# Patient Record
Sex: Female | Born: 1935
Health system: Southern US, Community
[De-identification: ages and names within clinical notes are randomized; demographics above are authoritative.]

## PROBLEM LIST (undated history)

## (undated) DIAGNOSIS — C4492 Squamous cell carcinoma of skin, unspecified: Secondary | ICD-10-CM

## (undated) DIAGNOSIS — T4145XA Adverse effect of unspecified anesthetic, initial encounter: Secondary | ICD-10-CM

## (undated) DIAGNOSIS — E079 Disorder of thyroid, unspecified: Secondary | ICD-10-CM

## (undated) DIAGNOSIS — M353 Polymyalgia rheumatica: Secondary | ICD-10-CM

## (undated) DIAGNOSIS — E785 Hyperlipidemia, unspecified: Secondary | ICD-10-CM

## (undated) DIAGNOSIS — T783XXA Angioneurotic edema, initial encounter: Secondary | ICD-10-CM

## (undated) DIAGNOSIS — N6019 Diffuse cystic mastopathy of unspecified breast: Secondary | ICD-10-CM

## (undated) DIAGNOSIS — K589 Irritable bowel syndrome without diarrhea: Secondary | ICD-10-CM

## (undated) DIAGNOSIS — R112 Nausea with vomiting, unspecified: Secondary | ICD-10-CM

## (undated) DIAGNOSIS — I10 Essential (primary) hypertension: Secondary | ICD-10-CM

## (undated) DIAGNOSIS — K6289 Other specified diseases of anus and rectum: Secondary | ICD-10-CM

## (undated) DIAGNOSIS — T8859XA Other complications of anesthesia, initial encounter: Secondary | ICD-10-CM

## (undated) DIAGNOSIS — F419 Anxiety disorder, unspecified: Secondary | ICD-10-CM

## (undated) DIAGNOSIS — Z9889 Other specified postprocedural states: Secondary | ICD-10-CM

## (undated) DIAGNOSIS — K5790 Diverticulosis of intestine, part unspecified, without perforation or abscess without bleeding: Secondary | ICD-10-CM

## (undated) DIAGNOSIS — E039 Hypothyroidism, unspecified: Secondary | ICD-10-CM

## (undated) HISTORY — DX: Hypothyroidism, unspecified: E03.9

## (undated) HISTORY — DX: Angioneurotic edema, initial encounter: T78.3XXA

## (undated) HISTORY — DX: Polymyalgia rheumatica: M35.3

## (undated) HISTORY — DX: Essential (primary) hypertension: I10

## (undated) HISTORY — PX: DILATION AND CURETTAGE OF UTERUS: SHX78

## (undated) HISTORY — DX: Diffuse cystic mastopathy of unspecified breast: N60.19

## (undated) HISTORY — DX: Hyperlipidemia, unspecified: E78.5

## (undated) HISTORY — DX: Disorder of thyroid, unspecified: E07.9

## (undated) HISTORY — DX: Irritable bowel syndrome, unspecified: K58.9

## (undated) HISTORY — DX: Diverticulosis of intestine, part unspecified, without perforation or abscess without bleeding: K57.90

## (undated) HISTORY — DX: Squamous cell carcinoma of skin, unspecified: C44.92

## (undated) HISTORY — DX: Other specified diseases of anus and rectum: K62.89

---

## 1970-09-05 HISTORY — PX: TONSILLECTOMY: SUR1361

## 1984-09-05 HISTORY — PX: OTHER SURGICAL HISTORY: SHX169

## 1986-09-05 HISTORY — PX: THYROIDECTOMY: SHX17

## 1999-11-08 ENCOUNTER — Encounter: Payer: Self-pay | Admitting: Internal Medicine

## 1999-11-08 ENCOUNTER — Ambulatory Visit (HOSPITAL_COMMUNITY): Admission: RE | Admit: 1999-11-08 | Discharge: 1999-11-08 | Payer: Self-pay | Admitting: Internal Medicine

## 1999-11-17 ENCOUNTER — Observation Stay (HOSPITAL_COMMUNITY): Admission: EM | Admit: 1999-11-17 | Discharge: 1999-11-18 | Payer: Self-pay | Admitting: Internal Medicine

## 2002-09-05 HISTORY — PX: COLONOSCOPY: SHX174

## 2003-02-06 ENCOUNTER — Encounter: Payer: Self-pay | Admitting: Internal Medicine

## 2003-02-06 ENCOUNTER — Ambulatory Visit (HOSPITAL_COMMUNITY): Admission: RE | Admit: 2003-02-06 | Discharge: 2003-02-06 | Payer: Self-pay | Admitting: Internal Medicine

## 2003-09-29 ENCOUNTER — Encounter: Payer: Self-pay | Admitting: Internal Medicine

## 2005-04-20 ENCOUNTER — Ambulatory Visit: Payer: Self-pay | Admitting: Internal Medicine

## 2006-02-01 ENCOUNTER — Ambulatory Visit: Payer: Self-pay | Admitting: Internal Medicine

## 2006-10-02 ENCOUNTER — Ambulatory Visit: Payer: Self-pay | Admitting: Internal Medicine

## 2006-10-02 LAB — CONVERTED CEMR LAB
ALT: 12 units/L (ref 0–40)
AST: 25 units/L (ref 0–37)
BUN: 11 mg/dL (ref 6–23)
Cholesterol: 146 mg/dL (ref 0–200)
Creatinine, Ser: 0.6 mg/dL (ref 0.4–1.2)
HDL: 50.4 mg/dL (ref >39.0)
Hgb A1c MFr Bld: 5.7 % (ref 4.6–6.0)
LDL Cholesterol: 78 mg/dL (ref 0–99)
Potassium: 3.4 meq/L — ABNORMAL LOW (ref 3.5–5.1)
TSH: 0.78 microintl units/mL (ref 0.35–5.50)
Total CHOL/HDL Ratio: 2.9
Triglycerides: 88 mg/dL (ref 0–149)
VLDL: 18 mg/dL (ref 0–40)

## 2006-10-26 ENCOUNTER — Ambulatory Visit: Payer: Self-pay | Admitting: Internal Medicine

## 2006-10-26 LAB — CONVERTED CEMR LAB
Basophils Absolute: 0.1 10*3/uL (ref 0.0–0.1)
Basophils Relative: 0.9 % (ref 0.0–1.0)
Eosinophils Absolute: 0.2 10*3/uL (ref 0.0–0.6)
Eosinophils Relative: 2.4 % (ref 0.0–5.0)
HCT: 39.9 % (ref 36.0–46.0)
Hemoglobin: 13.7 g/dL (ref 12.0–15.0)
Lymphocytes Relative: 36.5 % (ref 12.0–46.0)
MCHC: 34.3 g/dL (ref 30.0–36.0)
MCV: 92 fL (ref 78.0–100.0)
Monocytes Absolute: 0.6 10*3/uL (ref 0.2–0.7)
Monocytes Relative: 8.1 % (ref 3.0–11.0)
Neutro Abs: 3.7 10*3/uL (ref 1.4–7.7)
Neutrophils Relative %: 52.1 % (ref 43.0–77.0)
Platelets: 233 10*3/uL (ref 150–400)
RBC: 4.34 M/uL (ref 3.87–5.11)
RDW: 11.4 % — ABNORMAL LOW (ref 11.5–14.6)
WBC: 7.2 10*3/uL (ref 4.5–10.5)

## 2007-01-05 DIAGNOSIS — K589 Irritable bowel syndrome without diarrhea: Secondary | ICD-10-CM

## 2007-01-05 DIAGNOSIS — I10 Essential (primary) hypertension: Secondary | ICD-10-CM | POA: Insufficient documentation

## 2007-01-05 DIAGNOSIS — Z9889 Other specified postprocedural states: Secondary | ICD-10-CM

## 2007-01-05 DIAGNOSIS — N6019 Diffuse cystic mastopathy of unspecified breast: Secondary | ICD-10-CM

## 2007-01-08 ENCOUNTER — Ambulatory Visit: Payer: Self-pay | Admitting: Internal Medicine

## 2007-01-08 LAB — CONVERTED CEMR LAB
BUN: 9 mg/dL (ref 6–23)
Creatinine, Ser: 0.7 mg/dL (ref 0.4–1.2)
Potassium: 3.8 meq/L (ref 3.5–5.1)
TSH: 0.11 microintl units/mL — ABNORMAL LOW (ref 0.35–5.50)

## 2007-01-22 ENCOUNTER — Ambulatory Visit: Payer: Self-pay | Admitting: Cardiovascular Disease

## 2007-01-22 ENCOUNTER — Ambulatory Visit: Payer: Self-pay | Admitting: Internal Medicine

## 2007-01-22 LAB — CONVERTED CEMR LAB: Tissue Transglutaminase Ab, IgA: <3 units (ref <5)

## 2007-02-15 ENCOUNTER — Ambulatory Visit: Payer: Self-pay | Admitting: Internal Medicine

## 2007-02-15 DIAGNOSIS — D179 Benign lipomatous neoplasm, unspecified: Secondary | ICD-10-CM | POA: Insufficient documentation

## 2007-06-05 ENCOUNTER — Ambulatory Visit: Payer: Self-pay | Admitting: Internal Medicine

## 2007-06-10 LAB — CONVERTED CEMR LAB
Cholesterol: 184 mg/dL (ref 0–200)
HDL: 42.2 mg/dL (ref >39.0)
LDL Cholesterol: 122 mg/dL — ABNORMAL HIGH (ref 0–99)
Total CHOL/HDL Ratio: 4.4
Triglycerides: 101 mg/dL (ref 0–149)
VLDL: 20 mg/dL (ref 0–40)

## 2007-06-11 ENCOUNTER — Encounter (INDEPENDENT_AMBULATORY_CARE_PROVIDER_SITE_OTHER): Payer: Self-pay | Admitting: *Deleted

## 2007-06-29 ENCOUNTER — Ambulatory Visit: Payer: Self-pay | Admitting: Internal Medicine

## 2007-06-29 DIAGNOSIS — E782 Mixed hyperlipidemia: Secondary | ICD-10-CM

## 2007-06-29 LAB — CONVERTED CEMR LAB
Cholesterol, target level: 200 mg/dL
HDL goal, serum: 40 mg/dL
LDL Goal: 130 mg/dL

## 2007-09-26 ENCOUNTER — Encounter: Payer: Self-pay | Admitting: Internal Medicine

## 2007-10-05 ENCOUNTER — Telehealth (INDEPENDENT_AMBULATORY_CARE_PROVIDER_SITE_OTHER): Payer: Self-pay | Admitting: *Deleted

## 2007-10-10 ENCOUNTER — Telehealth (INDEPENDENT_AMBULATORY_CARE_PROVIDER_SITE_OTHER): Payer: Self-pay | Admitting: *Deleted

## 2007-10-15 ENCOUNTER — Ambulatory Visit: Payer: Self-pay | Admitting: Internal Medicine

## 2007-10-25 ENCOUNTER — Encounter (INDEPENDENT_AMBULATORY_CARE_PROVIDER_SITE_OTHER): Payer: Self-pay | Admitting: *Deleted

## 2007-10-25 LAB — CONVERTED CEMR LAB: TSH: 0.06 microintl units/mL — ABNORMAL LOW (ref 0.35–5.50)

## 2007-11-12 ENCOUNTER — Telehealth (INDEPENDENT_AMBULATORY_CARE_PROVIDER_SITE_OTHER): Payer: Self-pay | Admitting: *Deleted

## 2008-01-08 ENCOUNTER — Encounter: Payer: Self-pay | Admitting: Internal Medicine

## 2008-01-18 ENCOUNTER — Encounter: Payer: Self-pay | Admitting: Internal Medicine

## 2008-02-19 ENCOUNTER — Encounter: Payer: Self-pay | Admitting: Internal Medicine

## 2008-02-26 ENCOUNTER — Encounter: Payer: Self-pay | Admitting: Internal Medicine

## 2008-05-09 ENCOUNTER — Telehealth (INDEPENDENT_AMBULATORY_CARE_PROVIDER_SITE_OTHER): Payer: Self-pay | Admitting: *Deleted

## 2008-06-16 ENCOUNTER — Ambulatory Visit: Payer: Self-pay | Admitting: Internal Medicine

## 2008-06-16 DIAGNOSIS — K5730 Diverticulosis of large intestine without perforation or abscess without bleeding: Secondary | ICD-10-CM | POA: Insufficient documentation

## 2008-06-16 DIAGNOSIS — E890 Postprocedural hypothyroidism: Secondary | ICD-10-CM

## 2008-06-17 ENCOUNTER — Encounter (INDEPENDENT_AMBULATORY_CARE_PROVIDER_SITE_OTHER): Payer: Self-pay | Admitting: *Deleted

## 2008-07-29 ENCOUNTER — Telehealth (INDEPENDENT_AMBULATORY_CARE_PROVIDER_SITE_OTHER): Payer: Self-pay | Admitting: *Deleted

## 2008-10-16 ENCOUNTER — Telehealth (INDEPENDENT_AMBULATORY_CARE_PROVIDER_SITE_OTHER): Payer: Self-pay | Admitting: *Deleted

## 2008-11-28 ENCOUNTER — Telehealth: Payer: Self-pay | Admitting: Internal Medicine

## 2009-01-12 ENCOUNTER — Telehealth: Payer: Self-pay | Admitting: Internal Medicine

## 2009-02-06 DIAGNOSIS — Z85850 Personal history of malignant neoplasm of thyroid: Secondary | ICD-10-CM

## 2009-02-12 ENCOUNTER — Ambulatory Visit: Payer: Self-pay | Admitting: Internal Medicine

## 2009-07-03 ENCOUNTER — Ambulatory Visit: Payer: Self-pay | Admitting: Internal Medicine

## 2009-07-06 ENCOUNTER — Encounter (INDEPENDENT_AMBULATORY_CARE_PROVIDER_SITE_OTHER): Payer: Self-pay | Admitting: *Deleted

## 2009-08-06 ENCOUNTER — Encounter: Payer: Self-pay | Admitting: Internal Medicine

## 2009-09-05 DIAGNOSIS — M353 Polymyalgia rheumatica: Secondary | ICD-10-CM

## 2009-09-05 HISTORY — DX: Polymyalgia rheumatica: M35.3

## 2009-09-14 ENCOUNTER — Encounter (INDEPENDENT_AMBULATORY_CARE_PROVIDER_SITE_OTHER): Payer: Self-pay | Admitting: *Deleted

## 2009-09-22 ENCOUNTER — Encounter: Payer: Self-pay | Admitting: Internal Medicine

## 2009-10-30 ENCOUNTER — Telehealth: Payer: Self-pay | Admitting: Internal Medicine

## 2009-11-03 ENCOUNTER — Telehealth: Payer: Self-pay | Admitting: Internal Medicine

## 2009-11-30 ENCOUNTER — Encounter: Payer: Self-pay | Admitting: Internal Medicine

## 2010-05-05 ENCOUNTER — Ambulatory Visit: Payer: Self-pay | Admitting: Internal Medicine

## 2010-05-05 DIAGNOSIS — M545 Low back pain: Secondary | ICD-10-CM

## 2010-05-31 ENCOUNTER — Telehealth (INDEPENDENT_AMBULATORY_CARE_PROVIDER_SITE_OTHER): Payer: Self-pay | Admitting: *Deleted

## 2010-06-03 ENCOUNTER — Ambulatory Visit: Payer: Self-pay | Admitting: Internal Medicine

## 2010-06-03 DIAGNOSIS — N959 Unspecified menopausal and perimenopausal disorder: Secondary | ICD-10-CM | POA: Insufficient documentation

## 2010-06-03 DIAGNOSIS — M2550 Pain in unspecified joint: Secondary | ICD-10-CM | POA: Insufficient documentation

## 2010-06-03 DIAGNOSIS — IMO0001 Reserved for inherently not codable concepts without codable children: Secondary | ICD-10-CM | POA: Insufficient documentation

## 2010-06-04 ENCOUNTER — Telehealth (INDEPENDENT_AMBULATORY_CARE_PROVIDER_SITE_OTHER): Payer: Self-pay | Admitting: *Deleted

## 2010-06-07 LAB — CONVERTED CEMR LAB: Total CK: 39 units/L (ref 7–177)

## 2010-06-23 ENCOUNTER — Ambulatory Visit: Payer: Self-pay | Admitting: Internal Medicine

## 2010-06-23 DIAGNOSIS — M353 Polymyalgia rheumatica: Secondary | ICD-10-CM | POA: Insufficient documentation

## 2010-07-12 ENCOUNTER — Encounter: Payer: Self-pay | Admitting: Internal Medicine

## 2010-07-13 ENCOUNTER — Encounter: Payer: Self-pay | Admitting: Internal Medicine

## 2010-07-14 ENCOUNTER — Ambulatory Visit: Payer: Self-pay | Admitting: Internal Medicine

## 2010-07-14 ENCOUNTER — Encounter: Payer: Self-pay | Admitting: Internal Medicine

## 2010-07-14 DIAGNOSIS — C4492 Squamous cell carcinoma of skin, unspecified: Secondary | ICD-10-CM | POA: Insufficient documentation

## 2010-07-14 DIAGNOSIS — L219 Seborrheic dermatitis, unspecified: Secondary | ICD-10-CM | POA: Insufficient documentation

## 2010-07-15 ENCOUNTER — Encounter: Payer: Self-pay | Admitting: Internal Medicine

## 2010-07-16 LAB — CONVERTED CEMR LAB
ALT: 16 units/L (ref 0–35)
BUN: 11 mg/dL (ref 6–23)
Basophils Absolute: 0 10*3/uL (ref 0.0–0.1)
Bilirubin, Direct: 0.1 mg/dL (ref 0.0–0.3)
Chloride: 94 meq/L — ABNORMAL LOW (ref 96–112)
Cholesterol: 198 mg/dL (ref 0–200)
Creatinine, Ser: 0.6 mg/dL (ref 0.4–1.2)
Eosinophils Absolute: 0 10*3/uL (ref 0.0–0.7)
Eosinophils Relative: 0.4 % (ref 0.0–5.0)
LDL Cholesterol: 104 mg/dL — ABNORMAL HIGH (ref 0–99)
MCV: 90.2 fL (ref 78.0–100.0)
Monocytes Absolute: 0.6 10*3/uL (ref 0.1–1.0)
Neutrophils Relative %: 72.8 % (ref 43.0–77.0)
Platelets: 288 10*3/uL (ref 150.0–400.0)
RDW: 18.1 % — ABNORMAL HIGH (ref 11.5–14.6)
Total Bilirubin: 0.7 mg/dL (ref 0.3–1.2)
Triglycerides: 160 mg/dL — ABNORMAL HIGH (ref 0.0–149.0)
VLDL: 32 mg/dL (ref 0.0–40.0)
WBC: 9.3 10*3/uL (ref 4.5–10.5)

## 2010-07-23 ENCOUNTER — Encounter: Payer: Self-pay | Admitting: Internal Medicine

## 2010-09-30 ENCOUNTER — Encounter: Payer: Self-pay | Admitting: Internal Medicine

## 2010-10-03 LAB — CONVERTED CEMR LAB
ALT: 16 units/L (ref 0–35)
ALT: 27 units/L (ref 0–35)
AST: 28 units/L (ref 0–37)
Albumin: 4.1 g/dL (ref 3.5–5.2)
Alkaline Phosphatase: 54 units/L (ref 39–117)
Alkaline Phosphatase: 56 units/L (ref 39–117)
BUN: 10 mg/dL (ref 6–23)
Basophils Absolute: 0 10*3/uL (ref 0.0–0.1)
Basophils Relative: 0.5 % (ref 0.0–3.0)
Basophils Relative: 0.6 % (ref 0.0–3.0)
Bilirubin, Direct: 0 mg/dL (ref 0.0–0.3)
Bilirubin, Direct: 0.1 mg/dL (ref 0.0–0.3)
CO2: 29 meq/L (ref 19–32)
Calcium: 9.1 mg/dL (ref 8.4–10.5)
Calcium: 9.2 mg/dL (ref 8.4–10.5)
Chloride: 101 meq/L (ref 96–112)
Chloride: 96 meq/L (ref 96–112)
Cholesterol: 177 mg/dL (ref 0–200)
Creatinine, Ser: 0.6 mg/dL (ref 0.4–1.2)
Creatinine, Ser: 0.6 mg/dL (ref 0.4–1.2)
Eosinophils Absolute: 0.1 10*3/uL (ref 0.0–0.7)
Eosinophils Relative: 1.8 % (ref 0.0–5.0)
Eosinophils Relative: 2 % (ref 0.0–5.0)
GFR calc Af Amer: 127 mL/min
GFR calc non Af Amer: 105 mL/min
Glucose, Bld: 96 mg/dL (ref 70–99)
HCT: 39.3 % (ref 36.0–46.0)
HDL: 45.1 mg/dL (ref >39.00)
HDL: 46.3 mg/dL (ref >39.0)
Hemoglobin: 13.5 g/dL (ref 12.0–15.0)
LDL Cholesterol: 107 mg/dL — ABNORMAL HIGH (ref 0–99)
LDL Cholesterol: 110 mg/dL — ABNORMAL HIGH (ref 0–99)
Lymphocytes Relative: 34.5 % (ref 12.0–46.0)
Lymphocytes Relative: 36.3 % (ref 12.0–46.0)
MCHC: 34.3 g/dL (ref 30.0–36.0)
MCV: 94.7 fL (ref 78.0–100.0)
Monocytes Absolute: 0.4 10*3/uL (ref 0.1–1.0)
Monocytes Relative: 7.2 % (ref 3.0–12.0)
Neutro Abs: 3.3 10*3/uL (ref 1.4–7.7)
Neutrophils Relative %: 53.6 % (ref 43.0–77.0)
Neutrophils Relative %: 55.8 % (ref 43.0–77.0)
Platelets: 245 10*3/uL (ref 150–400)
Potassium: 4 meq/L (ref 3.5–5.1)
RBC: 3.95 M/uL (ref 3.87–5.11)
RBC: 4.15 M/uL (ref 3.87–5.11)
RDW: 11.1 % — ABNORMAL LOW (ref 11.5–14.6)
Sodium: 137 meq/L (ref 135–145)
TSH: 0.09 microintl units/mL — ABNORMAL LOW (ref 0.35–5.50)
Total Bilirubin: 1 mg/dL (ref 0.3–1.2)
Total CHOL/HDL Ratio: 3.8
Total CHOL/HDL Ratio: 4
Total Protein: 7 g/dL (ref 6.0–8.3)
Total Protein: 7.1 g/dL (ref 6.0–8.3)
Triglycerides: 105 mg/dL (ref 0–149)
Triglycerides: 88 mg/dL (ref 0.0–149.0)
VLDL: 21 mg/dL (ref 0–40)
WBC: 5.6 10*3/uL (ref 4.5–10.5)
WBC: 5.8 10*3/uL (ref 4.5–10.5)

## 2010-10-04 ENCOUNTER — Encounter: Payer: Self-pay | Admitting: Internal Medicine

## 2010-10-04 ENCOUNTER — Ambulatory Visit
Admission: RE | Admit: 2010-10-04 | Discharge: 2010-10-04 | Payer: Self-pay | Source: Home / Self Care | Attending: Internal Medicine | Admitting: Internal Medicine

## 2010-10-04 ENCOUNTER — Other Ambulatory Visit: Payer: Self-pay | Admitting: Internal Medicine

## 2010-10-04 DIAGNOSIS — M25559 Pain in unspecified hip: Secondary | ICD-10-CM | POA: Insufficient documentation

## 2010-10-04 LAB — SEDIMENTATION RATE: Sed Rate: 55 mm/hr — ABNORMAL HIGH (ref 0–22)

## 2010-10-07 NOTE — Progress Notes (Signed)
Summary: Wants chaper medicine  Medications Added BENTYL 20 MG TABS (DICYCLOMINE HCL) Take 1 tablet by mouth two times a day. PHARMACY-D/C ROBINUL AND LEVBID RX       Phone Note Call from Patient Call back at Work Phone [phone] Call back at is leaving at 1pm for dentist appt   Call For: Dr Juanda Chance Summary of Call: Had previously asked for a cheaper medicine. The one you ordered is even more. Cannot afford $80. Can she try another? Initial call taken by: Leanor Kail [facility],  November 03, 2009 1:15 PM  Follow-up for Phone Call        RX SENT. Follow-up by: Hortense Ramal CMA Duncan Dull),  November 03, 2009 1:40 PM    New/Updated Medications: BENTYL 20 MG TABS (DICYCLOMINE HCL) Take 1 tablet by mouth two times a day. PHARMACY-D/C ROBINUL AND LEVBID RX Prescriptions: BENTYL 20 MG TABS (DICYCLOMINE HCL) Take 1 tablet by mouth two times a day. PHARMACY-D/C ROBINUL AND LEVBID RX  #60 x 3   Entered by:   Hortense Ramal CMA (AAMA)   Authorized by:   Hart Carwin MD   Signed by:   Hortense Ramal CMA (AAMA) on 11/03/2009   Method used:   Electronically to        [facility] [address] [phone]* (retail)       [address]       [place], [place]  [address]       Ph: [phone]       Fax: [phone]   RxID:   [id number]

## 2010-10-07 NOTE — Letter (Signed)
Summary: Colonoscopy Date Change Letter  [facility]  [address] [place], [place] [address]   Phone: [phone]  Fax: [phone]      September 14, 2009 MRN: [id number]   [facility] [address] [place], [place]  [address]   Dear [name] [name],   Previously you were recommended to have a repeat colonoscopy around this time. Your chart was recently reviewed by Dr. Hedwig Morton. [name] [name] of [facility]. Follow up colonoscopy is now recommended in June 2014. This revised recommendation is based on current, nationally recognized guidelines for colorectal cancer screening and polyp surveillance. These guidelines are endorsed by the American Cancer Society, [employer] on Colorectal Cancer as well as numerous other major medical organizations.  Please understand that our recommendation assumes that you do not have any new symptoms such as bleeding, a change in bowel habits, anemia, or significant abdominal discomfort. If you do have any concerning GI symptoms or want to discuss the guideline recommendations, please call to arrange an office visit at your earliest convenience. Otherwise we will keep you in our reminder system and contact you 1-2 months prior to the date listed above to schedule your next colonoscopy.  Thank you,  Hedwig Morton. [name] [name], M.D.  [facility] [phone]

## 2010-10-07 NOTE — Progress Notes (Signed)
Summary: Follow-up with patient  Phone Note Outgoing Call Call back at [facility] Phone [phone]   Call placed by: Shonna Chock CMA,  June 04, 2010 4:32 PM Summary of Call: Left message on machine for patient to return call when avaliable, Reason for call:   Per Dr.Hopper please contact patient and let her know she has PMR which is a rheumatoid condition(this would explain her symptoms)she can look up on WedMD, patient needs to start prednisone 10mg  1 by mouth three times a day #90 and follow-up with Dr.Hopper in 2 weeks    Chrae [facility] CMA  June 04, 2010 4:33 PM   Follow-up for Phone Call        Patient didnt return call, I called patient again and left a detailed message on her voicemail informing her of Dr.Hopper's response, I informed patient that I was sending in rx to pharmacy on file and if she has any questions to call on Monday to futher address and to set up follow-up appointment in 2 weeks Follow-up by: Shonna Chock CMA,  June 04, 2010 5:06 PM  Additional Follow-up for Phone Call Additional follow up Details #1::        see labs& Appenda Additional Follow-up by: Marga Melnick MD,  June 05, 2010 10:06 AM    New/Updated Medications: PREDNISONE 10 MG TABS (PREDNISONE) 1 by mouth three times a day Prescriptions: PREDNISONE 10 MG TABS (PREDNISONE) 1 by mouth three times a day  #90 x 0   Entered by:   Shonna Chock CMA   Authorized by:   Marga Melnick MD   Signed by:   Shonna Chock CMA on 06/04/2010   Method used:   Electronically to        [facility] [address] [phone]* (retail)       [address]       [place], [place]  [address]       Ph: [phone]       Fax: [phone]   RxID:   [id number]

## 2010-10-07 NOTE — Progress Notes (Signed)
Summary: Medicine is more expensive now   Phone Note Call from Patient Call back at Home Phone [phone]   Call For: Dr Juanda Chance Reason for Call: Talk to Nurse Summary of Call: Glycoprrolate 2mg  all along has been paying $7 and yesterday it went up to tier 2 which will be $45. Would like to be switched to some other generic that will be less expensive.  Initial call taken by: Leanor Kail [employer],  October 30, 2009 9:17 AM  Follow-up for Phone Call        Dr Juanda Chance- Can we try patient on bentyl or levbid? Follow-up by: Hortense Ramal CMA Duncan Dull),  October 30, 2009 9:26 AM  Additional Follow-up for Phone Call Additional follow up Details #1::        I agree Levbid.375 mg by mouth two times a day.#60, 3 refills. If still expensive, startBentyl 20 mg by mouth two times a day, #60, 3 refills Additional Follow-up by: Hart Carwin MD,  October 30, 2009 12:52 PM     Appended Document: Medicine is more expensive now    Clinical Lists Changes  Medications: Changed medication from GLYCOPYRROLATE 2 MG  TABS (GLYCOPYRROLATE) Take 1/2-1 tablet by mouth two times a day as needed. to LEVBID 0.375 MG XR12H-TAB (HYOSCYAMINE SULFATE) Take 1 tablet by mouth two times a day PHARMACY-PLEASE D/C ROBINUL RX! - Signed Rx of LEVBID 0.375 MG XR12H-TAB (HYOSCYAMINE SULFATE) Take 1 tablet by mouth two times a day PHARMACY-PLEASE D/C ROBINUL RX!;  #60 x 3;  Signed;  Entered by: Hortense Ramal CMA (AAMA);  Authorized by: Hart Carwin MD;  Method used: Electronically to [facility] [address] [phone]*, [address], [place], [place]  [address], Ph: [phone], Fax: [phone]    Prescriptions: LEVBID 0.375 MG XR12H-TAB (HYOSCYAMINE SULFATE) Take 1 tablet by mouth two times a day PHARMACY-PLEASE D/C ROBINUL RX!  #60 x 3   Entered by:   Hortense Ramal CMA (AAMA)   Authorized by:   Hart Carwin MD   Signed by:   Hortense Ramal CMA (AAMA) on 11/02/2009   Method used:   Electronically to        [facility] [address] [address] [phone]* (retail)       [address]       [place], [place]  [address]       Ph: [phone]       Fax: [phone]   RxID:   [id number]

## 2010-10-07 NOTE — Miscellaneous (Signed)
Summary: Orders Update  Clinical Lists Changes  Orders: Added new Service order of Prescription Created Electronically (G8553) - Signed 

## 2010-10-07 NOTE — Letter (Signed)
Summary: Patient Questionnaire  Patient Questionnaire   Imported By: Lanelle Bal 09/24/2010 10:04:48  _____________________________________________________________________  External Attachment:    Type:   Image     Comment:   External Document

## 2010-10-07 NOTE — Miscellaneous (Signed)
Summary: Orders Update   Clinical Lists Changes  Orders: Added new Referral order of Radiology Referral (Radiology) - Signed 

## 2010-10-07 NOTE — Letter (Signed)
Summary: Patient No Show/[facility]  Patient No Show/[facility]   Imported By: Lanelle Bal 07/27/2010 08:35:54  _____________________________________________________________________  External Attachment:    Type:   Image     Comment:   External Document

## 2010-10-07 NOTE — Assessment & Plan Note (Signed)
Summary: FOLLOWUP, DISCUSS LABS///SPH   Vital Signs:  Patient profile:   76 year old female Weight:      152.6 pounds BMI:     25.49 Pulse rate:   60 / minute Resp:     15 per minute BP sitting:   114 / 62  (left arm) Cuff size:   large  Vitals Entered By: Shonna Chock CMA (June 23, 2010 10:10 AM) CC: Follow-up visit: discuss labs (patient with mailed copy)   Primary Care Alyha Marines:  Marga Melnick, MD  CC:  Follow-up visit: discuss labs (patient with mailed copy).  History of Present Illness: Her PMR symptoms had  resolved with Prednisone 10 mg 3X/ day  , but she decreased dose to 10 mg two times a day after a week due to concerns about HTN. Sed rate  was 113 pre steroids. BMDs have been normal. Vitamin D level 106 , ? on 1000 International Units once daily .  Current Medications (verified): 1)  Metoprolol Tartrate 100 Mg Tabs (Metoprolol Tartrate) .Marland Kitchen.. 1 By Mouth Qam and 1/2 Q Pm 2)  Synthroid 100 Mcg  Tabs (Levothyroxine Sodium) .Marland Kitchen.. 1 Once Daily 3)  Zyrtec 10 Mg  Tabs (Cetirizine Hcl) .... Prn 4)  Bentyl 20 Mg Tabs (Dicyclomine Hcl) .... Take 1 Tablet By Mouth Two Times A Day. Pharmacy-D/c Robinul and Levbid Rx 5)  Pravachol 40 Mg Tab (Pravastatin Sodium) .... Take 1 Tablet By Mouth Each Evening 6)  Spironolactone 25 Mg  Tabs (Spironolactone) .... Take One Tablet Daily 7)  Benadryl 25 Mg Caps (Diphenhydramine Hcl) .... As Needed 8)  Citalopram Hydrobromide 20 Mg Tabs (Citalopram Hydrobromide) .... Take One Half Tablet By Mouth Daily 9)  Prednisone 10 Mg Tabs (Prednisone) .Marland Kitchen.. 1 By Mouth Three Times A Day  Allergies: 1)  ! Pcn 2)  ! * Polysporin 3)  ! Neosporin 4)  ! * Horse Serum 5)  ! * Flu Vaccination 6)  ! * Shellfish  Review of Systems General:  Denies chills, fever, sweats, and weight loss. MS:  Denies joint pain, joint redness, and joint swelling.  Physical Exam  General:  well-nourished,in no acute distress; alert,appropriate and cooperative throughout  examination; appears younger than age Neck:  Full ROM w/o pain Extremities:  No clubbing, cyanosis, edema, or deformity noted with normal full range of motion of all joints.  Minor crepitus of shoulders & knees Cervical Nodes:  No lymphadenopathy noted Axillary Nodes:  No palpable lymphadenopathy   Impression & Recommendations:  Problem # 1:  POLYMYALGIA RHEUMATICA (ICD-725)  Orders: Venipuncture (14782) TLB-Sedimentation Rate (ESR) (85652-ESR)  Complete Medication List: 1)  Metoprolol Tartrate 100 Mg Tabs (Metoprolol tartrate) .Marland Kitchen.. 1 by mouth qam and 1/2 q pm 2)  Synthroid 100 Mcg Tabs (Levothyroxine sodium) .Marland Kitchen.. 1 once daily 3)  Zyrtec 10 Mg Tabs (Cetirizine hcl) .... Prn 4)  Bentyl 20 Mg Tabs (Dicyclomine hcl) .... Take 1 tablet by mouth two times a day. pharmacy-d/c robinul and levbid rx 5)  Pravachol 40 Mg Tab (Pravastatin sodium) .... Take 1 tablet by mouth each evening 6)  Spironolactone 25 Mg Tabs (Spironolactone) .... Take one tablet daily 7)  Benadryl 25 Mg Caps (Diphenhydramine hcl) .... As needed 8)  Citalopram Hydrobromide 20 Mg Tabs (Citalopram hydrobromide) .... Take one half tablet by mouth daily 9)  Prednisone 10 Mg Tabs (Prednisone) .Marland Kitchen.. 1 by mouth three times a day  Patient Instructions: 1)  Check sed rate monthly.   Orders Added: 1)  Est. Patient Level  III [id number] 2)  Venipuncture [40981] 3)  TLB-Sedimentation Rate (ESR) [85652-ESR]  Appended Document: FOLLOWUP, DISCUSS LABS///SPH

## 2010-10-07 NOTE — Assessment & Plan Note (Signed)
Summary: FOR ECPX/MED REFILL//PH   Vital Signs:  Patient profile:   76 year old female Height:      65.75 inches Weight:      152.6 pounds BMI:     24.91 Temp:     98.1 degrees F oral Pulse rate:   64 / minute Resp:     14 per minute BP sitting:   114 / 64  (left arm) Cuff size:   large  Vitals Entered By: Shonna Chock CMA (July 14, 2010 10:48 AM) CC: CPX with fasting labs  Comments Patient unable to get Flu Vaccine-egg allergy, unable to get TD vaccine-horse allergy   Primary Care Provider:  Marga Melnick, MD  CC:  CPX with fasting labs .  History of Present Illness: Here for Medicare  Wellness Visit  1.   Risk factors based on Past M, S, F history: PMR (actively being treated with oral steroids); Dyslipidemia;HTN; Hypothyroidism ( chart updated) 2.   Physical Activities: walking 3-4X/week 20 min 3.   Depression/mood: no issues 4.   Hearing:whisper heard @ 6 ft  5.   ADL's: no limitations 6.   Fall Risk: denied 7.   Home Safety: safety proofed 8.   Height, weight, &visual acuity:wall chart read @ 6 ft w/o lenses 9.   Counseling:POA & Living Will in place  10.   Labs ordered based on risk factors  11.           Referral Coordination: BMD scheduled ; mammograms due  12.           Care Plan:see Instructions 13.            Cognitive Assessment: Oriented X 3 ; memory & recall  excellent   ; "WORLD" spelled backwards; mood & affect normal. Hyperlipidemia Follow-Up      The patient denies muscle aches, GI upset, abdominal pain, flushing, itching, constipation, diarrhea, and fatigue.  The patient denies the following symptoms: chest pain/pressure, exercise intolerance, dypsnea, palpitations, syncope, and pedal edema.  Compliance with medications (by patient report) has been near 100%.  Adjunctive measures currently used by the patient include ASA, fish oil supplements, and Co-QA.   Hypertension Follow-Up      The patient also presents for Hypertension follow-up.  The patient  denies lightheadedness and urinary frequency.  Compliance with medications (by patient report) has been near 100%.  Adjunctive measures currently used by the patient include salt restriction.  BP controlled @ home ; < 120/80.  Preventive Screening-Counseling & Management  Alcohol-Tobacco     Alcohol drinks/day: <1     Smoking Status: never  Caffeine-Diet-Exercise     Caffeine use/day: none     Diet Comments: no diet specifically, low fat/ carb  Hep-HIV-STD-Contraception     Dental Visit-last 6 months yes     Sun Exposure-Excessive: no  Safety-Violence-Falls     Seat Belt Use: yes     Smoke Detectors: yes      Blood Transfusions:  no.        Travel History:  [place].    Current Medications (verified): 1)  Metoprolol Tartrate 100 Mg Tabs (Metoprolol Tartrate) .Marland Kitchen.. 1 By Mouth Qam and 1/2 Q Pm 2)  Synthroid 100 Mcg  Tabs (Levothyroxine Sodium) .Marland Kitchen.. 1 Once Daily 3)  Bentyl 20 Mg Tabs (Dicyclomine Hcl) .... Take 1 Tablet By Mouth Two Times A Day. Pharmacy-D/c Robinul and Levbid Rx 4)  Pravachol 40 Mg Tab (Pravastatin Sodium) .... Take 1 Tablet By Mouth Each  Evening 5)  Spironolactone 25 Mg  Tabs (Spironolactone) .... Take One Tablet Daily 6)  Benadryl 25 Mg Caps (Diphenhydramine Hcl) .... As Needed 7)  Citalopram Hydrobromide 20 Mg Tabs (Citalopram Hydrobromide) .... Take One Half Tablet By Mouth Daily  Allergies: 1)  ! Pcn 2)  ! * Polysporin 3)  ! Neosporin 4)  ! * Horse Serum 5)  ! * Flu Vaccination 6)  ! * Shellfish 7)  ! * Eggs  Past History:  Past Medical History: chest pain, negative cardiac workup in  2000 & 2001; Peri rectal cyst (? lymphocoele); Hurtle cell/follicular  ;THYROID CANCER, HX OF (ICD-V10.87), post irradiation & surgery HYPOTHYROIDISM (ICD-244.9) , post operative DIVERTICULOSIS, COLON (ICD-562.10) HYPERLIPIDEMIA (ICD-272.2) LIPOMA NOS (ICD-214.9) HYPERTENSION (ICD-401.9) HYPOKALEMIA (ICD-276.8), PMH of IBS (ICD-564.1) FIBROCYSTIC BREAST  DISEASE (ICD-610.1)  Skin cancer, hx of, Squamous cell Seborrheic Dermatitis;Angioedema from shellfish & nuts  Past Surgical History: Thyroidectomy 1988, S/P radiation(previously seen by Levonne Hubert, MD @ [facility], now followed byDR Leta Speller); G 2 P 2   D&C X1 Tonsillectomy Exploratory Surgery for Rectal Mass; Perirectal cystectomy Colonoscopy 2004, Dr Juanda Chance  Family History: Father: prostate cancer Mother: HTN Siblings: bro :Parkinson's                                                            Paternal Uncle:: MI @ 52;PGF: MI d 49; Maternal Grandmother: breast cancer, HTN Daughter died in [place] , 3 grandchildren  Social History: Retired Never Smoked Alcohol use-yes:occasionally Caffeine use/day:  none Dental Care w/in 6 mos.:  yes Sun Exposure-Excessive:  no Risk analyst Use:  yes Blood Transfusions:  no  Review of Systems  The patient denies anorexia, fever, weight loss, weight gain, vision loss, decreased hearing, hoarseness, prolonged cough, hemoptysis, melena, hematochezia, severe indigestion/heartburn, hematuria, unusual weight change, abnormal bleeding, enlarged lymph nodes, and angioedema.    Physical Exam  General:  well-nourished, appears younger than age; alert,appropriate and cooperative throughout examination Head:  Normocephalic and atraumatic without obvious abnormalities. Eyes:  No corneal or conjunctival inflammation noted.Perrla. Funduscopic exam benign, without hemorrhages, exudates or papilledema.  Ears:  External ear exam shows no significant lesions or deformities.  Otoscopic examination reveals clear canals, tympanic membranes are intact bilaterally without bulging, retraction, inflammation or discharge. Hearing is grossly normal bilaterally. Nose:  External nasal examination shows no deformity or inflammation. Nasal mucosa are pink and moist without lesions or exudates. septal dislocation Mouth:  Oral mucosa and oropharynx without lesions or  exudates.  Teeth in good repair. Neck:  No deformities, masses, or tenderness noted. Thyroid surgically absent Lungs:  Normal respiratory effort, chest expands symmetrically. Lungs are clear to auscultation, no crackles or wheezes. Heart:  Normal rate and regular rhythm. S1 and S2 normal without gallop, murmur, click, rub . S4 with slurring Abdomen:  Bowel sounds positive,abdomen soft and non-tender without masses, organomegaly or hernias noted. Genitalia:  Dr Jackquline Denmark, [facility] Msk:  No deformity or scoliosis noted of thoracic or lumbar spine.   Pulses:  R and L carotid,radial,dorsalis pedis and posterior tibial pulses are full and equal bilaterally Extremities:  No clubbing, cyanosis, edema, or deformity noted with normal full range of motion of all joints.  Mild crepitus of knees  Neurologic:  alert & oriented X3 and DTRs symmetrical and normal.  Skin:  Scattered excoriated papules Cervical Nodes:  No lymphadenopathy noted Axillary Nodes:  No palpable lymphadenopathy Psych:  memory intact for recent and remote, normally interactive, and good eye contact.     Impression & Recommendations:  Problem # 1:  PREVENTIVE HEALTH CARE (ICD-V70.0)  Orders: Medicare -1st Annual Wellness Visit [phone])  Problem # 2:  POLYMYALGIA RHEUMATICA (ICD-725) steroids being weaned  Problem # 3:  THYROID CANCER, HX OF (ICD-V10.87) as per Dr Uvaldo Rising, [facility]  Problem # 4:  HYPOTHYROIDISM (ICD-244.9) see #3 above, goal is suppresed TSH due to thyroid cancer history Her updated medication list for this problem includes:    Synthroid 100 Mcg Tabs (Levothyroxine sodium) .Marland Kitchen... 1 once daily  Problem # 5:  HYPERTENSION (ICD-401.9)  Her updated medication list for this problem includes:    Metoprolol Tartrate 100 Mg Tabs (Metoprolol tartrate) .Marland Kitchen... 1 by mouth qam and 1/2 q pm    Spironolactone 25 Mg Tabs (Spironolactone) .Marland Kitchen... Take one tablet daily  Orders: EKG w/ Interpretation (93000) Venipuncture  (13086) TLB-BMP (Basic Metabolic Panel-BMET) (80048-METABOL) Specimen Handling (57846)  Problem # 6:  HYPERLIPIDEMIA (ICD-272.2)  Her updated medication list for this problem includes:    Pravachol 40 Mg Tab (Pravastatin sodium) .Marland Kitchen... Take 1 tablet by mouth each evening  Orders: TLB-Lipid Panel (80061-LIPID) TLB-Hepatic/Liver Function Pnl (80076-HEPATIC)  Problem # 7:  IBS (ICD-564.1)  Orders: TLB-CBC Platelet - w/Differential (85025-CBCD) Specimen Handling ([address])  Complete Medication List: 1)  Metoprolol Tartrate 100 Mg Tabs (Metoprolol tartrate) .Marland Kitchen.. 1 by mouth qam and 1/2 q pm 2)  Synthroid 100 Mcg Tabs (Levothyroxine sodium) .Marland Kitchen.. 1 once daily 3)  Bentyl 20 Mg Tabs (Dicyclomine hcl) .... Take 1 tablet by mouth two times a day. pharmacy-d/c robinul and levbid rx 4)  Pravachol 40 Mg Tab (Pravastatin sodium) .... Take 1 tablet by mouth each evening 5)  Spironolactone 25 Mg Tabs (Spironolactone) .... Take one tablet daily 6)  Benadryl 25 Mg Caps (Diphenhydramine hcl) .... As needed 7)  Citalopram Hydrobromide 20 Mg Tabs (Citalopram hydrobromide) .... Take one half tablet by mouth daily  Other Orders: Pneumococcal Vaccine (28413) Admin 1st Vaccine (24401)  Patient Instructions: 1)  It is important that you exercise regularly at least 20 minutes 5 times a week. If you develop chest pain, have severe difficulty breathing, or feel very tired , stop exercising immediately and seek medical attention. Go to ER if Epi Pen employed. BMD  indicated because of need to suppress TSH , oral steroids & post menopausal status.  2)  Schedule your mammogram. Prescriptions: CITALOPRAM HYDROBROMIDE 20 MG TABS (CITALOPRAM HYDROBROMIDE) TAKE ONE HALF TABLET BY MOUTH DAILY  #30 x 5   Entered and Authorized by:   Marga Melnick MD   Signed by:   Marga Melnick MD on 07/14/2010   Method used:   Print then Give to Patient   RxID:   [id number] SPIRONOLACTONE 25 MG  TABS (SPIRONOLACTONE) TAKE ONE  TABLET DAILY  #90 x 3   Entered and Authorized by:   Marga Melnick MD   Signed by:   Marga Melnick MD on 07/14/2010   Method used:   Print then Give to Patient   RxID:   [id number] PRAVACHOL 40 MG TAB (PRAVASTATIN SODIUM) Take 1 tablet by mouth each evening  #90 x 3   Entered and Authorized by:   Marga Melnick MD   Signed by:   Marga Melnick MD on 07/14/2010   Method used:   Print then Give to Patient  RxID:   [id number] METOPROLOL TARTRATE 100 MG TABS (METOPROLOL TARTRATE) 1 by mouth qam and 1/2 q pm  #90 x 4   Entered and Authorized by:   Marga Melnick MD   Signed by:   Marga Melnick MD on 07/14/2010   Method used:   Print then Give to Patient   RxID:   [id number]    Orders Added: 1)  Pneumococcal Vaccine [90732] 2)  Admin 1st Vaccine [90471] 3)  Medicare -1st Annual Wellness Visit [G0438] 4)  Est. Patient Level III [84696] 5)  EKG w/ Interpretation [93000] 6)  Venipuncture [36415] 7)  TLB-Lipid Panel [80061-LIPID] 8)  TLB-BMP (Basic Metabolic Panel-BMET) [80048-METABOL] 9)  TLB-CBC Platelet - w/Differential [85025-CBCD] 10)  TLB-Hepatic/Liver Function Pnl [80076-HEPATIC] 11)  Specimen Handling [99000]   Immunizations Administered:  Pneumonia Vaccine:    Vaccine Type: Pneumovax (Medicare)    Site: right deltoid    Mfr: Merck    Dose: 0.5 ml    Route: IM    Given by: Shonna Chock CMA    Exp. Date: 11/20/2011    Lot #: [id number]   Immunizations Administered:  Pneumonia Vaccine:    Vaccine Type: Pneumovax (Medicare)    Site: right deltoid    Mfr: Merck    Dose: 0.5 ml    Route: IM    Given by: Shonna Chock CMA    Exp. Date: 11/20/2011    Lot #: [id number]

## 2010-10-07 NOTE — Progress Notes (Signed)
Summary: refill  Phone Note Refill Request Message from:  Fax from Pharmacy on May 31, 2010 3:41 PM  Refills Requested: Medication #1:  METOPROLOL TARTRATE 100 MG TABS 1 by mouth qam and 1/2 q pm [facility] - fax [phone]   -----last appt med refill [id number] - no pending appt  Initial call taken by: Okey Regal [place],  May 31, 2010 3:43 PM    Prescriptions: METOPROLOL TARTRATE 100 MG TABS (METOPROLOL TARTRATE) 1 by mouth qam and 1/2 q pm  #90 x 0   Entered by:   Shonna Chock CMA   Authorized by:   Marga Melnick MD   Signed by:   Shonna Chock CMA on 05/31/2010   Method used:   Electronically to        [facility] [phone]* (retail)       [address]       [place], [place]  [address]       Ph: [phone]       Fax: [phone]   RxID:   [id number]

## 2010-10-07 NOTE — Assessment & Plan Note (Signed)
Summary: cough/cbs   Vital Signs:  Patient profile:   76 year old female Weight:      156 pounds BMI:     26.05 Temp:     99.0 degrees F oral Pulse rate:   76 / minute Resp:     17 per minute BP sitting:   116 / 70  (left arm) Cuff size:   large  Vitals Entered By: Shonna Chock CMA (May 05, 2010 3:33 PM) CC: Cough and joint pain x 2 weeks, Lower Extremity Joint pain   Primary Care Provider:  Marga Melnick, MD  CC:  Cough and joint pain x 2 weeks and Lower Extremity Joint pain.  History of Present Illness: Cough      This is a 76 year old woman who presents with Cough X 2&1/2 weeks .  The patient reports non-productive cough and malaise, but denies pleuritic chest pain, shortness of breath, wheezing, exertional dyspnea, and fever.  The patient denies the following symptoms: cold/URI symptoms, sore throat, nasal congestion, and acid reflux symptoms.  The cough is worse with lying down & talking.  No Rx. Lower Extremity Joint Pain      The patient also presents with Lower Extremity Joint pain X 2 & 1/2 weeks.  The patient denies swelling, redness, locking, popping, decreased ROM, and weakness.  The pain is located in the right posterior  hip/ SI joint.  The pain began gradually and with no injury. It was worse after prolonged travel. The pain is described as sharp, intermittent, and activity related.  No evaluation to date . The patient denies the following symptoms: fever, rash, photosensitivity, eye symptoms, diarrhea, and dysuria. No Rx.   Allergies: 1)  ! Pcn 2)  ! * Polysporin 3)  ! Neosporin 4)  ! * Horse Serum 5)  ! * Flu Vaccination 6)  ! * Shellfish  Review of Systems General:  Denies chills, sweats, and weight loss. GU:  Denies discharge and hematuria. Derm:  Denies lesion(s) and rash. Heme:  Denies abnormal bruising and bleeding.  Physical Exam  General:  Appears younger than age,well-nourished,in no acute distress; alert,appropriate and cooperative throughout  examination Ears:  External ear exam shows no significant lesions or deformities.  Otoscopic examination reveals clear canals, tympanic membranes are intact bilaterally without bulging, retraction, inflammation or discharge. Hearing is grossly normal bilaterally. Nose:  External nasal examination shows no deformity or inflammation. Nasal mucosa are pink and moist without lesions or exudates. Mouth:  Oral mucosa and oropharynx without lesions or exudates.  Teeth in good repair. Lungs:  Normal respiratory effort, chest expands symmetrically. Lungs are clear to auscultation, no crackles or wheezes. Heart:  Normal rate and regular rhythm. S1 and S2 normal without gallop, murmur, click, rub.S4 with slight slurring Msk:  sat up w/o help Extremities:  No clubbing, cyanosis, edema, or deformity noted with normal full range of motion of all joints.  Mild crepitus of knees , R >L. Pain in inguinal areas with SLR  Skin:  Intact without suspicious lesions or rashes Cervical Nodes:  No lymphadenopathy noted Axillary Nodes:  No palpable lymphadenopathy   Impression & Recommendations:  Problem # 1:  BRONCHITIS-ACUTE (ICD-466.0)  Her updated medication list for this problem includes:    Azithromycin 74 Mg Tabs (Azithromycin) .Marland Kitchen... As per pack  Problem # 2:  LOW BACK PAIN SYNDROME (ICD-724.2)  Her updated medication list for this problem includes:    Cyclobenzaprine Hcl 5 Mg Tabs (Cyclobenzaprine hcl) .Marland Kitchen... 1-2 at bedtime  as needed for back pain    Celebrex 200 Mg Caps (Celecoxib) .Marland Kitchen..Marland Kitchen Two times a day as needed back pain  Complete Medication List: 1)  Metoprolol Tartrate 100 Mg Tabs (Metoprolol tartrate) .Marland Kitchen.. 1 by mouth qam and 1/2 q pm 2)  Synthroid 100 Mcg Tabs (Levothyroxine sodium) .Marland Kitchen.. 1 once daily 3)  Zyrtec 10 Mg Tabs (Cetirizine hcl) .... Prn 4)  Bentyl 20 Mg Tabs (Dicyclomine hcl) .... Take 1 tablet by mouth two times a day. pharmacy-d/c robinul and levbid rx 5)  Pravachol 40 Mg Tab  (Pravastatin sodium) .... Take 1 tablet by mouth each evening 6)  Spironolactone 25 Mg Tabs (Spironolactone) .... Take one tablet daily 7)  Benadryl 25 Mg Caps (Diphenhydramine hcl) .... As needed 8)  Citalopram Hydrobromide 20 Mg Tabs (Citalopram hydrobromide) .... Take one half tablet by mouth daily 9)  Azithromycin 250 Mg Tabs (Azithromycin) .... As per pack 10)  Prednisone 20 Mg Tabs (Prednisone) .Marland Kitchen.. 1 two times a day with a meal 11)  Cyclobenzaprine Hcl 5 Mg Tabs (Cyclobenzaprine hcl) .Marland Kitchen.. 1-2 at bedtime as needed for back pain 12)  Celebrex 200 Mg Caps (Celecoxib) .... Two times a day as needed back pain  Patient Instructions: 1)  Please establish with a primary care MD in [place]. Walk every 60-90 min  when driving. Prescriptions: CELEBREX 200 MG CAPS (CELECOXIB) two times a day as needed back pain  #12 x 0   Entered and Authorized by:   Marga Melnick MD   Signed by:   Marga Melnick MD on 05/05/2010   Method used:   Samples Given   RxID:   [id number] CYCLOBENZAPRINE HCL 5 MG TABS (CYCLOBENZAPRINE HCL) 1-2 at bedtime as needed for back pain  #14 x 0   Entered and Authorized by:   Marga Melnick MD   Signed by:   Marga Melnick MD on 05/05/2010   Method used:   Faxed to ...       [facility] [phone]* (retail)       [address]       [place], [place]  [address]       Ph: [phone]       Fax: [phone]   RxID:   [id number] PREDNISONE 20 MG TABS (PREDNISONE) 1 two times a day with a meal  #14 x 0   Entered and Authorized by:   Marga Melnick MD   Signed by:   Marga Melnick MD on 05/05/2010   Method used:   Faxed to ...       [facility] [phone]* (retail)       [address]       [place], [place]  [address]       Ph: [phone]       Fax: [phone]   RxID:   [id number] AZITHROMYCIN 250 MG TABS (AZITHROMYCIN) as per pack  #1 x 0   Entered and Authorized by:   Marga Melnick MD   Signed by:   Marga Melnick MD on 05/05/2010   Method used:    Faxed to ...       [facility] [phone]* (retail)       [address]       [place], [place]  [address]       Ph: [phone]       Fax: [phone]   RxID:   [id number]

## 2010-10-07 NOTE — Assessment & Plan Note (Signed)
Summary: STILL W/ JOINT PAIN/CDJ   Vital Signs:  Patient profile:   76 year old female Weight:      154.4 pounds BMI:     25.79 Temp:     98.1 degrees F oral Pulse rate:   72 / minute Resp:     15 per minute BP sitting:   118 / 72  (left arm) Cuff size:   large  Vitals Entered By: Shonna Chock CMA (June 03, 2010 10:10 AM) CC: Ongoing joint pain   Primary Care Provider:  Marga Melnick, MD  CC:  Ongoing joint pain.  History of Present Illness: After meds completed cough  & joint symptoms resolved, but arthralgias rceurred off Celebrex.Rx: Aleve 1 pill two times a day helps some. Pain in inguinal areas with stiffness in thighs with ambulation.Also both shoulders & upper arm muscles. No PMH or FH of  significant arthritis.LB symptoms described 05/05/2010  have resolved.  Current Medications (verified): 1)  Metoprolol Tartrate 100 Mg Tabs (Metoprolol Tartrate) .Marland Kitchen.. 1 By Mouth Qam and 1/2 Q Pm 2)  Synthroid 100 Mcg  Tabs (Levothyroxine Sodium) .Marland Kitchen.. 1 Once Daily 3)  Zyrtec 10 Mg  Tabs (Cetirizine Hcl) .... Prn 4)  Bentyl 20 Mg Tabs (Dicyclomine Hcl) .... Take 1 Tablet By Mouth Two Times A Day. Pharmacy-D/c Robinul and Levbid Rx 5)  Pravachol 40 Mg Tab (Pravastatin Sodium) .... Take 1 Tablet By Mouth Each Evening 6)  Spironolactone 25 Mg  Tabs (Spironolactone) .... Take One Tablet Daily 7)  Benadryl 25 Mg Caps (Diphenhydramine Hcl) .... As Needed 8)  Citalopram Hydrobromide 20 Mg Tabs (Citalopram Hydrobromide) .... Take One Half Tablet By Mouth Daily  Allergies: 1)  ! Pcn 2)  ! * Polysporin 3)  ! Neosporin 4)  ! * Horse Serum 5)  ! * Flu Vaccination 6)  ! * Shellfish  Review of Systems General:  Denies chills, fever, sweats, and weight loss. GU:  No dark urine. MS:  Complains of muscle aches; denies joint redness, joint swelling, low back pain, mid back pain, and thoracic pain. Derm:  Denies changes in color of skin, lesion(s), and rash. Neuro:  Denies numbness and  tingling.  Physical Exam  General:  ,in no acute distress; alert,appropriate and cooperative throughout examination Eyes:  No corneal or conjunctival inflammation noted.No icterus  Extremities:  No clubbing, cyanosis, edema, or deformity noted with normal full range of motion of all joints.  Pain with ROM of shoulders & @ posterior hip with ROM. No significant OA changes. No muscle tenderness Neurologic:  strength normal in all extremities and DTRs symmetrical and normal.     Impression & Recommendations:  Problem # 1:  ARTHRALGIA (ICD-719.40)  Orders: Venipuncture (40981) TLB-Sedimentation Rate (ESR) (85652-ESR) TLB-Rheumatoid Factor (RA) (19147-WG)  Problem # 2:  MUSCLE PAIN (ICD-729.1)  The following medications were removed from the medication list:    Cyclobenzaprine Hcl 5 Mg Tabs (Cyclobenzaprine hcl) .Marland Kitchen... 1-2 at bedtime as needed for back pain    Celebrex 200 Mg Caps (Celecoxib) .Marland Kitchen..Marland Kitchen Two times a day as needed back pain  Orders: Venipuncture ([address]) TLB-Sedimentation Rate (ESR) (85652-ESR) T-Vitamin D (25-Hydroxy) (30865-78469) TLB-CK Total Only(Creatine Kinase/CPK) (82550-CK)  Problem # 3:  POSTMENOPAUSAL SYNDROME (ICD-627.9)  Orders: T-Vitamin D (25-Hydroxy) ([address]2952-84132)  Complete Medication List: 1)  Metoprolol Tartrate 100 Mg Tabs (Metoprolol tartrate) .Marland Kitchen.. 1 by mouth qam and 1/2 q pm 2)  Synthroid 100 Mcg Tabs (Levothyroxine sodium) .Marland Kitchen.. 1 once daily 3)  Zyrtec 10 Mg Tabs (Cetirizine  hcl) .... Prn 4)  Bentyl 20 Mg Tabs (Dicyclomine hcl) .... Take 1 tablet by mouth two times a day. pharmacy-d/c robinul and levbid rx 5)  Pravachol 40 Mg Tab (Pravastatin sodium) .... Take 1 tablet by mouth each evening 6)  Spironolactone 25 Mg Tabs (Spironolactone) .... Take one tablet daily 7)  Benadryl 25 Mg Caps (Diphenhydramine hcl) .... As needed 8)  Citalopram Hydrobromide 20 Mg Tabs (Citalopram hydrobromide) .... Take one half tablet by mouth daily  Patient  Instructions: 1)  Aleve 1-2 every 12 hrs as needed WITH food. 2)  Take 650-1000mg  of Tylenol every 4-6 hours as needed for relief of pain or comfort of fever AVOID taking more than 4000mg   in a 24 hour period (can cause liver damage in higher doses).Glucosamine 1500 mg / chondroitin  daily X 6-8 weeks as trial if labs normal.  Appended Document: STILL W/ JOINT PAIN/CDJ

## 2010-10-08 ENCOUNTER — Telehealth (INDEPENDENT_AMBULATORY_CARE_PROVIDER_SITE_OTHER): Payer: Self-pay | Admitting: *Deleted

## 2010-10-13 NOTE — Progress Notes (Signed)
Summary: Prednisone refill  Phone Note Refill Request Call back at Home Phone [phone] Message from:  Patient on October 08, 2010 2:11 PM  Refills Requested: Medication #1:  PREDNISONE 10 MG TABS 1/2 once daily asdirected. [facility], [place], [place], [place]------says she is out, but didnt know what her recent labs showed to see if she should continue or not continue to take it  Next Appointment Scheduled: none Initial call taken by: Jerolyn Shin,  October 08, 2010 2:13 PM  Follow-up for Phone Call        Dr.Hopper please advise  Follow-up by: Shonna Chock CMA,  October 08, 2010 2:28 PM  Additional Follow-up for Phone Call Additional follow up Details #1::        Per Dr.Hopper, refer to recent lab append.  I called patient and she ok'd increasing med to 15mg  daily and rechecking labs in 4 weeks  Additional Follow-up by: Shonna Chock CMA,  October 08, 2010 3:45 PM    New/Updated Medications: PREDNISONE 10 MG TABS (PREDNISONE) 1 1/2 by mouth once daily Prescriptions: PREDNISONE 10 MG TABS (PREDNISONE) 1 1/2 by mouth once daily  #45 x 0   Entered by:   Shonna Chock CMA   Authorized by:   Marga Melnick MD   Signed by:   Shonna Chock CMA on 10/08/2010   Method used:   Electronically to        [facility] [phone]* (retail)       [address]       [place], [place]  [address]       Ph: [phone]       Fax: [phone]   RxID:   [id number]

## 2010-10-13 NOTE — Assessment & Plan Note (Signed)
Summary: rto discuss lab/cbs   Vital Signs:  Patient profile:   76 year old female Weight:      154.4 pounds BMI:     25.20 Temp:     98.8 degrees F oral Pulse rate:   72 / minute Resp:     15 per minute BP sitting:   110 / 70  (left arm) Cuff size:   large  Vitals Entered By: Shonna Chock CMA (October 04, 2010 10:21 AM) CC: 1.) Follow-up on labs (with mailed copy) and B/P   Primary Care Provider:  Marga Melnick, MD  CC:  1.) Follow-up on labs (with mailed copy) and B/P.  History of Present Illness:    BMD reviewed; T scores WNL but no spine images included with report.  Labs reviewed  with her. TFTs were done by Dr Uvaldo Rising @ [facility] 01/26..  The patient reports itching from seborrheic dermatitis, but denies muscle aches, GI upset, abdominal pain, flushing, constipation, diarrhea, and fatigue.  The patient denies the following symptoms: chest pain/pressure, exercise intolerance, dypsnea, palpitations, syncope, and pedal edema.  Compliance with medications (by patient report) has been near 100%.  Dietary compliance has been good.  The patient reports exercising 3 X per week.  Adjunctive measures currently used by the patient include fiber, fish oil supplements, and Co-Q 10.                                                 She had  decreased  Prednisone to 5 mg once daily since past month.  She denies significant joint symptoms except am stiffness in hips for 4 hrs.  Current Medications (verified): 1)  Metoprolol Tartrate 100 Mg Tabs (Metoprolol Tartrate) .Marland Kitchen.. 1 By Mouth Qam and 1/2 Q Pm 2)  Synthroid 100 Mcg  Tabs (Levothyroxine Sodium) .Marland Kitchen.. 1 Once Daily 3)  Bentyl 20 Mg Tabs (Dicyclomine Hcl) .... Take 1 Tablet By Mouth Two Times A Day. Pharmacy-D/c Robinul and Levbid Rx 4)  Pravachol 40 Mg Tab (Pravastatin Sodium) .... Take 1 Tablet By Mouth Each Evening 5)  Spironolactone 25 Mg  Tabs (Spironolactone) .... Take One Tablet Daily 6)  Benadryl 25 Mg Caps (Diphenhydramine Hcl) .... As  Needed 7)  Citalopram Hydrobromide 20 Mg Tabs (Citalopram Hydrobromide) .... Take One Half Tablet By Mouth Daily 8)  Prednisone 10 Mg Tabs (Prednisone) .Marland Kitchen.. 10 Mg Pills; Take 1/2 Pill Three Times A Day With Meals(#45)  Allergies: 1)  ! Pcn 2)  ! * Polysporin 3)  ! Neosporin 4)  ! * Horse Serum 5)  ! * Flu Vaccination 6)  ! * Shellfish 7)  ! * Eggs  Physical Exam  General:  Appears much younger than age,well-nourished,in no acute distress; alert,appropriate and cooperative throughout examination Neck:  No deformities, masses, or tenderness noted. Full ROM  Lungs:  Normal respiratory effort, chest expands symmetrically. Lungs are clear to auscultation, no crackles or wheezes. Heart:  normal rate, regular rhythm, no gallop, no rub, no JVD, and grade 1 /6 systolic murmur.   Extremities:  No clubbing, cyanosis, edema, or deformity noted with normal full range of motion of all joints.   Minor crepitus of knees Neurologic:  alert & oriented X3, strength normal in all extremities, and DTRs symmetrical and normal.   Skin:  Intact without suspicious lesions or rashes Cervical Nodes:  No lymphadenopathy noted  Axillary Nodes:  No palpable lymphadenopathy Psych:  memory intact for recent and remote, normally interactive, and good eye contact.     Impression & Recommendations:  Problem # 1:  HYPERLIPIDEMIA (ICD-272.2) Lipids @ goal Her updated medication list for this problem includes:    Pravachol 40 Mg Tab (Pravastatin sodium) .Marland Kitchen... Take 1 tablet by mouth each evening  Problem # 2:  POLYMYALGIA RHEUMATICA (ICD-725)  clinically stable  Orders: TLB-CK Total Only(Creatine Kinase/CPK) (82550-CK) TLB-Sedimentation Rate (ESR) (85652-ESR) T-Rheumatoid Factor ([address]-60[address]) Prescription Created Electronically [phone])  Problem # 3:  HIP PAIN, BILATERAL (ICD-719.45)  Orders: TLB-CK Total Only(Creatine Kinase/CPK) (82550-CK) TLB-Sedimentation Rate (ESR) (85652-ESR) T-Rheumatoid Factor  ([address]-29[address])  Complete Medication List: 1)  Metoprolol Tartrate 100 Mg Tabs (Metoprolol tartrate) .Marland Kitchen.. 1 by mouth qam and 1/2 q pm 2)  Synthroid 100 Mcg Tabs (Levothyroxine sodium) .Marland Kitchen.. 1 once daily 3)  Bentyl 20 Mg Tabs (Dicyclomine hcl) .... Take 1 tablet by mouth two times a day. pharmacy-d/c robinul and levbid rx 4)  Pravachol 40 Mg Tab (Pravastatin sodium) .... Take 1 tablet by mouth each evening 5)  Spironolactone 25 Mg Tabs (Spironolactone) .... Take one tablet daily 6)  Benadryl 25 Mg Caps (Diphenhydramine hcl) .... As needed 7)  Citalopram Hydrobromide 20 Mg Tabs (Citalopram hydrobromide) .... Take one half tablet by mouth daily 8)  Prednisone 10 Mg Tabs (Prednisone) .... 1/2 once daily asdirected  Patient Instructions: 1)  Exact instructions concerning Prednisone will be determined by sed rate. Prescriptions: PREDNISONE 10 MG TABS (PREDNISONE) 1/2 once daily asdirected  #30 x 0   Entered and Authorized by:   Marga Melnick MD   Signed by:   Marga Melnick MD on 10/04/2010   Method used:   Electronically to        [facility] [phone]* (retail)       [address]       [place], [place]  [address]       Ph: [phone]       Fax: [phone]   RxID:   [id number]    Orders Added: 1)  Est. Patient Level IV [87564] 2)  TLB-CK Total Only(Creatine Kinase/CPK) [82550-CK] 3)  TLB-Sedimentation Rate (ESR) [85652-ESR] 4)  T-Rheumatoid Factor [33295-18841] 5)  Prescription Created Electronically [Y6063]  Appended Document: rto discuss lab/cbs

## 2010-10-21 NOTE — Letter (Signed)
Summary: [facility]   Imported By: Maryln Gottron 10/15/2010 10:07:37  _____________________________________________________________________  External Attachment:    Type:   Image     Comment:   External Document

## 2010-10-29 ENCOUNTER — Other Ambulatory Visit (INDEPENDENT_AMBULATORY_CARE_PROVIDER_SITE_OTHER): Payer: Medicare Other

## 2010-10-29 ENCOUNTER — Encounter (INDEPENDENT_AMBULATORY_CARE_PROVIDER_SITE_OTHER): Payer: Self-pay | Admitting: *Deleted

## 2010-10-29 ENCOUNTER — Other Ambulatory Visit: Payer: Self-pay | Admitting: Internal Medicine

## 2010-10-29 ENCOUNTER — Encounter: Payer: Self-pay | Admitting: Internal Medicine

## 2010-10-29 ENCOUNTER — Telehealth: Payer: Self-pay | Admitting: Internal Medicine

## 2010-10-29 DIAGNOSIS — M069 Rheumatoid arthritis, unspecified: Secondary | ICD-10-CM

## 2010-10-29 LAB — SEDIMENTATION RATE: Sed Rate: 20 mm/hr (ref 0–22)

## 2010-11-02 LAB — CONVERTED CEMR LAB: Cyclic Citrullin Peptide Ab: <2 U

## 2010-11-02 NOTE — Progress Notes (Signed)
Summary: Med refill  Medications Added BENTYL 20 MG TABS (DICYCLOMINE HCL) Take 1 tablet by mouth two times a day. MUST HAVE OFFICE VISIT FOR FURTHER REFILLS!       Phone Note Call from Patient Call back at [facility] Phone [phone]   Caller: Patient Call For: Dr. Juanda Chance Reason for Call: Talk to Nurse Summary of Call: Needs a refill for her Dicyclomine...[facility] in [place].......Marland KitchenMarland KitchenAppt sch'd on 12-06-10 Initial call taken by: Karna Christmas,  October 29, 2010 10:39 AM    New/Updated Medications: BENTYL 20 MG TABS (DICYCLOMINE HCL) Take 1 tablet by mouth two times a day. MUST HAVE OFFICE VISIT FOR FURTHER REFILLS! Prescriptions: BENTYL 20 MG TABS (DICYCLOMINE HCL) Take 1 tablet by mouth two times a day. MUST HAVE OFFICE VISIT FOR FURTHER REFILLS!  #60 x 1   Entered by:   Lamona Curl CMA (AAMA)   Authorized by:   Hart Carwin MD   Signed by:   Lamona Curl CMA (AAMA) on 10/29/2010   Method used:   Electronically to        [facility] [phone]* (retail)       [address]       [place], [place]  [address]       Ph: [phone]       Fax: [phone]   RxID:   [id number]

## 2010-11-04 ENCOUNTER — Telehealth: Payer: Self-pay | Admitting: Internal Medicine

## 2010-11-11 NOTE — Progress Notes (Signed)
Summary: Prednisone refill  Phone Note Refill Request Message from:  Patient on November 04, 2010 2:14 PM  Refills Requested: Medication #1:  PREDNISONE 10 MG TABS 1 1/2 by mouth once daily. out of medication as of this weekend---[facility] [address], [place], [place]  Next Appointment Scheduled: none Initial call taken by: Jerolyn Shin,  November 04, 2010 2:16 PM  Follow-up for Phone Call        Dr.Hopper please advise  Follow-up by: Shonna Chock CMA,  November 04, 2010 2:33 PM  Additional Follow-up for Phone Call Additional follow up Details #1::        #90 as per slow weaning schedule Additional Follow-up by: Marga Melnick MD,  November 04, 2010 3:08 PM    Prescriptions: PREDNISONE 10 MG TABS (PREDNISONE) 1 1/2 by mouth once daily  #90 x 0   Entered by:   Doristine Devoid CMA   Authorized by:   Marga Melnick MD   Signed by:   Doristine Devoid CMA on 11/04/2010   Method used:   Electronically to        [facility] [phone]* (retail)       [address]       [place], [place]  [address]       Ph: [phone]       Fax: [phone]   RxID:   [id number]

## 2010-12-06 ENCOUNTER — Other Ambulatory Visit (INDEPENDENT_AMBULATORY_CARE_PROVIDER_SITE_OTHER): Payer: Medicare Other

## 2010-12-06 ENCOUNTER — Telehealth: Payer: Self-pay | Admitting: *Deleted

## 2010-12-06 ENCOUNTER — Encounter: Payer: Self-pay | Admitting: Internal Medicine

## 2010-12-06 ENCOUNTER — Ambulatory Visit (INDEPENDENT_AMBULATORY_CARE_PROVIDER_SITE_OTHER): Payer: Medicare Other | Admitting: Internal Medicine

## 2010-12-06 VITALS — BP 124/78 | HR 64 | Ht 65.5 in | Wt 154.0 lb

## 2010-12-06 DIAGNOSIS — K589 Irritable bowel syndrome without diarrhea: Secondary | ICD-10-CM

## 2010-12-06 MED ORDER — DICYCLOMINE HCL 20 MG PO TABS: ORAL_TABLET | ORAL | Status: DC

## 2010-12-06 NOTE — Telephone Encounter (Signed)
Message copied by Jesse Fall on Mon Dec 06, 2010  2:42 PM ------      Message from: [place], [place]      Created: Mon Dec 06, 2010  2:35 PM       Please call pt with negative H.pylori test DB

## 2010-12-06 NOTE — Telephone Encounter (Signed)
Patient returned our call and left me a message  that she is on the road driving.States she will call back tomorrow AM.

## 2010-12-06 NOTE — Telephone Encounter (Signed)
Message copied by Latunya Kissick on Mon Dec 06, 2010  2:42 PM ------      Message from: BRODIE, DORA      Created: Mon Dec 06, 2010  2:35 PM       Please call pt with negative H.pylori test DB 

## 2010-12-06 NOTE — Telephone Encounter (Signed)
Unable to reach patient.

## 2010-12-06 NOTE — Telephone Encounter (Signed)
Message copied by Jesse Fall on Mon Dec 06, 2010  3:21 PM ------      Message from: [place], [place]      Created: Mon Dec 06, 2010  2:35 PM       Please call pt with negative H.pylori test DB

## 2010-12-06 NOTE — Telephone Encounter (Signed)
Left a message for patient to call me. 

## 2010-12-06 NOTE — Patient Instructions (Addendum)
We have sent a prescription for bentyl to your pharmacy for you to pick up. Please continue to take it 1 tablet by mouth twice daily. You will be due for a recall colonoscopy with propofol sedation in June 2014. Your physician has requested that you go to the basement for the following lab work before leaving today: H Pylori antibody CC: Dr Marga Melnick

## 2010-12-06 NOTE — Progress Notes (Signed)
[name] [name] 1935/07/24 MRN [id number]   History of Present Illness:  This is a 76 year old white female with diarrhea predominant irritable bowel syndrome. I have been seeing her for at least 10 years. Her last colonoscopy in 2004 was incomplete due to a tortuous and redundant colon. A subsequent barium enema confirmed marked tortuosity of the sigmoid and transverse colon. She was initially on Lotronex and subsequently on Robinul. She is currently on Bentyl 20 mg a day. There is a history of a cystic rectal mass followed by MRIs in the past. Her last MRI in March 2001 showed a stable pelvic cystic mass measuring 5x7 cm consistent with a lymphocele. She has rectal sphincter tone dysfunction. There is a history of a thyroidectomy in 1988. She was tested for celiac disease in 2008 and was negative.   Past Medical History  Diagnosis Date  . Chest pain 2000 and 2001    negative cardiac workup  . Perirectal cyst     ?lymphocoele  . Thyroid cancer   . Hypothyroidism   . Diverticulosis   . Hyperlipidemia   . Lipoma   . Hypertension   . Hypokalemia   . Irritable bowel syndrome   . Fibrocystic breast disease   . Squamous cell skin cancer   . Seborrheic dermatitis   . Allergic angioedema     from shellfish and nuts   Past Surgical History  Procedure Date  . Thyroidectomy 1988    s/p radiation previously seen by [name] Hubert, MD @ [facility], now followed by Dr. Uvaldo [name]  . Dilation and curettage of uterus   . Tonsillectomy 1972  . Exporatory surgery for rectal mass   . Perirectal cystectomy     reports that she has never smoked. She has never used smokeless tobacco. She reports that she does not drink alcohol or use illicit drugs. family history includes Breast cancer in her maternal grandmother; Heart attack in her paternal grandfather and unspecified family member; Hypertension in her maternal grandmother and mother; Parkinsonism in her brother; and Prostate cancer in her  father. Allergies  Allergen Reactions  . Bacitracin-Polymyxin B   . Eggs Or Egg-Derived Products     REACTION: Unable to get Flu Vaccine  . Horse-Derived Products   . Penicillins   . Shellfish-Derived Products   . Triple Antibiotic         Review of Systems: Denies symptoms of gastroesophageal reflux. Dysphagia. Abdominal pain. Admits to occasional diarrhea but denies incontinence  The remainder of the 10  point ROS is negative except as outlined in H&P   Physical Exam: General appearance  Well developed, in no distress. Eyes- non icteric. HEENT nontraumatic, normocephalic. Mouth no lesions, tongue papillated, no cheilosis. Neck supple without adenopathy, thyroid not enlarged, no carotid bruits, no JVD. Lungs Clear to auscultation bilaterally Cor normal S1 normal S2, regular rhythm , no murmur,  quiet precordium. Abdomen soft abdomen with normal active bowel sounds. No tenderness. No distention. No palpable mass. Rectal: Mildly decreased rectal sphincter tone, soft Hemoccult negative stool. Extremities no pedal edema. Skin no lesions. Neurological alert and oriented x 3. Psychological normal mood and affect.  Assessment and Plan:  Problems #1 irritable bowel syndrome with predominant diarrhea. This is under reasonable control but antispasmodics. We will refill her Bentyl. She will be due for a colonoscopy in June 2014. She will need Propofol anesthesia.  Problem #2 Pelvic mass consistent with stable lymphocele. This has been followed by MRIs. Her last MRI was in March 2001.  We discussed repeating the MRI but since she is asymptomatic and the lesion was declared stable, I don't see any compelling reason to repeat it and she agrees.   12/06/2010 [name] [name]

## 2010-12-07 ENCOUNTER — Telehealth: Payer: Self-pay | Admitting: *Deleted

## 2010-12-07 NOTE — Telephone Encounter (Signed)
Message copied by Jesse Fall on Tue Dec 07, 2010  9:22 AM ------      Message from: [place], [place]      Created: Mon Dec 06, 2010  2:35 PM       Please call pt with negative H.pylori test DB

## 2010-12-07 NOTE — Telephone Encounter (Signed)
Left message for patient to call me

## 2010-12-07 NOTE — Telephone Encounter (Signed)
Message copied by Jesse Fall on Tue Dec 07, 2010  8:37 AM ------      Message from: [name]      Created: Mon Dec 06, 2010  2:35 PM       Please call pt with negative H.pylori test DB

## 2010-12-07 NOTE — Telephone Encounter (Signed)
Spoke with patient and gave her lab results as per Dr. Brodie 

## 2010-12-16 ENCOUNTER — Telehealth: Payer: Self-pay | Admitting: Internal Medicine

## 2010-12-16 NOTE — Telephone Encounter (Signed)
Spoke with patient and discussed BMI ranges as researched on internet and what it means.

## 2010-12-23 ENCOUNTER — Encounter: Payer: Self-pay | Admitting: Internal Medicine

## 2011-01-07 ENCOUNTER — Encounter: Payer: Self-pay | Admitting: Internal Medicine

## 2011-01-18 NOTE — Letter (Signed)
Jan 08, 2007    [name] Fells. Excell Seltzer, MD  [address]  [place], [place] [address]   RE:  [name], [name]  MRN:  [id number]  /  DOB:  [birth year]   Dear Dr. Excell [name]:   You are scheduled to see [name] [name] for followup of hypertension;she  previously saw Dr. Chales Abrahams.   In January, she was taken off hydrochlorothiazide because of a potassium  of 3.4.  At that time, spironolactone 25 mg was initiated, if blood  pressure averaged greater than 130/85.  BUN and creatinine were normal  at that time.   She has no cardiopulmonary symptoms.  At home, her blood pressures  ranged from 94/60 to 130/81.   On exam, weight is stable, pulse 56, respiratory rate 16 and blood  pressure was 132/64.  This is on metoprolol 100 mg one in the morning  and half in the evening.  She is concerned because of low blood  pressures, particularly  at or around bedtime.   On exam, she has an occasional premature beat; EKG in January did show  PAC's.  Chest was clear.  She has a grade 1/2 to 1 systolic murmur.  No  periumbilical bruits were noted, and there was no enlargement of the  aorta.  Pedal pulses are intact, and she has no edema.   She completed a health fair screen which showed no evidence of aneurysm  or coronary artery disease.  Her ankle brachial index was also normal.  Heel scan was normal which is not a surprise.  I do encourage her to  have ongoing bone densities of the spine and femur rather than of the  heel, as this is not predictive of future fracture event.  I would also  recommend that she continue calcium and vitamin D with at least 1000  International Units of Vitamin D daily.   She is on suppressive Synthroid therapy because of prior history of  thyroidectomy or cancer followed by radiation.Her endocrinologist, Dr.  Hermina Staggers at [facility] wishes to keep her TSH in the range of 0.1 to  0.5; it was 0.78 in January.  That will be rechecked at this time.  The  suppressive thyroid therapy  is an issue in reference to osteopenia  risks.   She will remain on the present medications until she is evaluated by  you.    Sincerely,      Titus Dubin. Alwyn Ren, MD,FACP,FCCP  Electronically Signed    WFH/MedQ  DD: 01/08/2007  DT: 01/08/2007  Job #: [id number]

## 2011-01-18 NOTE — Assessment & Plan Note (Signed)
[facility]                         GASTROENTEROLOGY OFFICE NOTE   NAME:[name] [name], [name] [name]                    MRN:          [id number]  DATE:01/22/2007                            DOB:          [birth year]    HISTORY:  [name] [name] is a 76 year old white female with irritable bowel  syndrome.  She used to be on Lotronex daily, but after discontinuation  we switched her to an antispasmodic.  Since she was started on Robinul  42 mg twice a day in February of this year her symptoms have improved  and she has not had any episodes of diarrhea or incontinence.   The patient works part-time.  Her eating habits have been that of a high-  fiver diet.  She denies rectal bleeding.   MEDICATIONS:  1. Synthroid 0.1 mg daily.  2. Metoprolol 100 mg, 1.5 tablets by mouth daily.  3. Robinul 42 mg by mouth twice a day.  4. Vytorin 10/21 by mouth daily.  5. Spironolactone 25 mg by mouth daily.  6. Zyrtec.   PHYSICAL EXAMINATION:  VITAL SIGNS:  Blood pressure 124/76, pulse 64 and  weight 159 pounds.  GENERAL APPEARANCE:  The patient is alert and oriented, and in no  distress.  LUNGS:  The lungs are clear to auscultation.  HEART:  Cor is with normal S1 and S2.  ABDOMEN:  The abdomen is mildly protuberant.  There is minimal  tenderness in all quadrants.  There are somewhat hyperactive bowel  sounds.  RECTAL:  The rectal exam is not repeated.   IMPRESSION:  This is a 76 year old white female with irritable bowel  syndrome/diarrhea currently responding to antispasmodic.   PLAN:  If the antispasmodic stop working we will consider restarting the  Lotronex program.  I have given the patient a booklet to review and  sign.  The medication will be available to her in case the  antispasmodics do not work.     Hedwig Morton. Juanda Chance, MD  Electronically Signed    DMB/MedQ  DD: 01/22/2007  DT: 01/22/2007  Job #: [id number]   cc:   Titus Dubin. Alwyn Ren, MD,FACP,FCCP

## 2011-01-18 NOTE — Letter (Signed)
Jan 22, 2007    Titus Dubin. Alwyn Ren, MD,FACP,FCCP  [phone] [place] [address]   RE:  BURNICE, VASSEL  MRN:  [id number]  /  DOB:  03-19-1935   Dear Jearld Adjutant,   I saw [name] [name] for followup of her hypertension as an outpatient  at the [facility] office on Jan 22, 2007.   As you know her well, [name] [name] is a 76 year old woman with  hypertension that has been treated with metoprolol and more recently  with the addition of Spironolactone.  She had previously been with  hydrochlorothiazide and had some trouble with hypokalemia.  She  therefore was switched to Spironolactone.  She reports today for review  of her antihypertensive treatment as well as evaluation for  palpitations.  She complains of evening and nighttime palpitations that  generally occur at rest.  She is fairly busy during the daytime and does  not notice them as much during those hours.  She has no exertional  symptoms.  She specifically denies chest pain, dyspnea, orthopnea, PND,  edema, lightheadedness, or syncope.  She otherwise feels well and has no  complaints.   She brings in records of her home blood pressure which are really in the  ideal range.  To my review, she did not have one single systolic blood  pressure greater than 130 and did not have a single diastolic blood  pressure greater than 85.  Her low blood pressure was 94/60.  Again, she  has not suffered from lightheadedness or near syncope.  She also brings  in her health fair data from her recent screening studies.  She had a  carotid ultrasound screening that was completely normal.  She also  underwent an abdominal aortic ultrasound which showed normal dimensions  of the abdominal aorta and underwent ABIs which were 1.1 bilaterally  which is in the normal range.   CURRENT MEDICATIONS:  1. Metoprolol 100 mg in the morning and 50 mg in the evening.  2. Synthroid 0.1 mg daily.  3. Spironolactone 25 mg daily.  4. Vytorin  10/20 mg daily.  5. Trimethoprim as needed.  6. Zyrtec daily.   PHYSICAL EXAMINATION:  GENERAL:  The patient is alert and oriented.  She  is in no acute distress.  VITAL SIGNS:  Weight is 160 pounds.  Blood pressure is 129/74 in both  arms.  Heart rate is 60.  Respiratory rate is 12.  HEENT:  Normal.  NECK:  Normal carotid upstrokes without bruits.  Jugular venous pressure  is normal.  LUNGS:  Clear to auscultation bilaterally.  HEART:  Regular rate and rhythm without murmurs or gallops.  ABDOMEN:  Soft, nontender, no organomegaly.  EXTREMITIES:  No cyanosis, clubbing, or edema.  Peripheral pulses are 2+  and equal throughout.   EKG shows normal sinus rhythm and is within normal limits.   ASSESSMENT:  [name] [name] is a 76 year old woman with palpitations and  hypertension.  Her cardiac problem list is as follows:  1. Essential hypertension.  I think she clearly has outstanding      control of her blood pressure.  At this point, I would not      recommend making any changes to her medication regimen.  I think      that decreasing her beta blocker would likely worsen her      palpations, and therefore I would not make any change whatsoever.      She seems to have responded well to  a combination of metoprolol and      Aldactone, and this is certainly reasonable in her case.  2. Palpitations.  I suspect these are benign palpitations but would      like to check a 24 hour Holter monitor just to make sure.  Her      palpitations are typical of a benign nature in that they occur in      the evening time when she is resting.  She has no evidence of      structural heart disease or any reason to suspect serious      arrhythmia.  I do think that her symptoms are at least partially      related to her caffeine intake.  She tells me that she drinks a lot      of caffeinated soda and I advised that if she is able to reduce      this, that her symptoms would probably improve.   Otherwise, I  think [name] [name] is doing exceptionally well.  She has no  evidence of vascular disease on her noninvasive studies that have been  performed with health fair screening.  I have  scheduled her to follow up on a yearly basis, but I would be happy to  see her at any time if problems arise.   Hop, thanks again for allowing me to see [name] [name].  Please feel free  to call at any time with questions regarding her care.    Sincerely,      Veverly Fells. Excell Seltzer, MD  Electronically Signed    MDC/MedQ  DD: 01/22/2007  DT: 01/22/2007  Job #: [id number]

## 2011-01-21 NOTE — Assessment & Plan Note (Signed)
[facility]                         GASTROENTEROLOGY OFFICE NOTE   NAME:[name], [name] KOVICH                    MRN:          [id number]  DATE:10/26/2006                            DOB:          02-Jan-1935    [name] [name] is a 76 year old white female with irritable bowel syndrome,  history of benign pelvic mass diadnosed as lymphocele. We have not seen  her now for several years. Last colonoscopy in May 2004 was incomplete  because of tortuosity and adhesions. Prior colonoscopy in 1998 was  normal. She is here just for a refill of her medications. Irritable  bowel syndrome is relatively stable. She has predominant diarrhea and  some leakage of the stool because of incompetent rectal sphincter.   MEDICATIONS:  1. Metoprolol 100 mg 1-1/2 tablets p.o. daily.  2. Synthroid 0.5 mg p.o. daily.  3. Aldactone 25 mg p.o. daily.  4. Vytorin 10/20 one p.o. daily.  5. Trimethoprim p.r.n.  6. Donnatal p.r.n.  7. Aspirin 81 mg p.o. daily.   PAST HISTORY:  Significant for thyroid removal in 1988 and tonsillectomy  in 1972.   FAMILY HISTORY:  Positive for heart disease in mother, breast cancer,  maternal grandmother.   SOCIAL HISTORY:  The patient is divorced, has 2 children. She does not  smoke, does not drink alcohol.   PHYSICAL EXAMINATION:  Blood pressure 122/80, pulse 68, weight 166  pounds. She was alert and oriented in no acute distress.  LUNGS:  Clear to auscultation.  COR:  Normal S1 and normal S2.  ABDOMEN:  Soft with minimal discomfort in the left middle quadrant. No  palpable mass. No distention. Liver edge at costal margin.  RECTAL EXAM:  Showed slightly decreased rectal tone. Soft, hemoccult  negative stool in the ampulla.   IMPRESSION:  A 76 year old white female with irritable bowel syndrome,  quite stable, due for next colonoscopy in May 2011. She has decreased  rectal tone.   PLAN:  1. Begin Benefiber as a fiber supplement.  2. She  wants to switch from Donnatal to some other antispasmodic. We      will try Robinul Forte 2 mg 1 p.o. b.i.d.  3. High-fiber diet.  4. We will recall her for colonoscopy in May 2011.  5. CBC today.     Hedwig Morton. Juanda Chance, MD  Electronically Signed    DMB/MedQ  DD: 10/26/2006  DT: 10/26/2006  Job #: [id number]   cc:   Titus Dubin. Alwyn Ren, MD,FACP,FCCP

## 2011-01-21 NOTE — Assessment & Plan Note (Signed)
[facility]                        [place] OFFICE NOTE   NAME:[name], [name]                    MRN:          [id number]  DATE:10/02/2006                            DOB:          1935/01/12    [name] [name] was seen for med refill on October 02, 2006.   Due to sleep disturbance she had adjusted her thyroid dose from the  prior scedule of 1 daily except for 1 1/2 on Wednesday to 1 daily. She  also had palpitations when she had been on 1-1/2 two days a week,  alternating with 1 on all other days.  She does have some skin dryness,  has been followed by a dermatologist.  She has some temperature  intolerance to cold.  She has bowel changes of irritable bowel with  alternating constipation, diarrhea for which she is followed by Dr.  Juanda Chance.   Bone mineral density remains within normal limits and she is on 1200 mg  of calcium and 400 International units of vitamin D. Daily intake of  1000IU of vit D was encouraged.   There is no change in her past medical history.  Her last  hospitalization was in 2001 for chest pain.  Significantly she underwent  thyroidectomy for cancer in 1998, followed by radiation.  She has been  followed by Dr. Hermina Staggers at [facility].  He has  recommended a TSH goal of 0.1 to 0.5.   She does drink 3 glasses of coke per day.  She was concerned about the  efficacy of Vytorin  based on recent newspaper reports.   She has moved to [place], [place].   Weight was up approximately 3 pounds to 158.  Post op changes were noted over the neck with no palpable thyroid  tissue.  She exhibited an S4 with a grade 1 holosystolic murmur.  There was a  regular murmur without rhythm disturbance or premature beats.  She had no organomegaly or lymphadenopathy.   Her EKG did reveal PACs with nonspecific ST-T wave changes in V2 through  V3.   Her lipids were at goal.  Specifically total cholesterol was  146, HDL 50  and LDL 78.  I gave her a letter describing the Vytorin issue.  If she  still feels uncomfortable the medication could be changed.  She has had  mild hyperglycemia in the past; her A1C is 5.7.  I have asked her to  avoid sugars and hyperglycemic white carbs.  The A1C should be checked  at least annually.   Her TSH was 0.78.  I have recommended that she increase the Synthroid to  0.1 mg daily except 11/2 on Wednesday's.  If she continues to have sleep  disturbance or palpitations, then Holter monitor or nuclear stress test  can be performed.  The latter may be more appropriate in view of the non-  specific ST-T wave changes.   Her potassium was 3.4 which may be contributing to the palpitations.  I  have asked her to stop her hydrochlorothiazide and take spironolactone  25 mg daily.  Her BUN, creatinine, SGOT and SGPT  were all normal.   If she will be residing in the [place] area I will encourage her to  affiliate with a primary care physician there.     Titus Dubin. Alwyn Ren, MD,FACP,FCCP  Electronically Signed    WFH/MedQ  DD: 10/05/2006  DT: 10/05/2006  Job #: [id number]

## 2011-01-25 ENCOUNTER — Other Ambulatory Visit: Payer: Medicare Other

## 2011-01-25 DIAGNOSIS — Z1211 Encounter for screening for malignant neoplasm of colon: Secondary | ICD-10-CM

## 2011-08-02 ENCOUNTER — Other Ambulatory Visit: Payer: Self-pay | Admitting: Internal Medicine

## 2011-08-04 ENCOUNTER — Other Ambulatory Visit: Payer: Self-pay | Admitting: Internal Medicine

## 2011-08-08 ENCOUNTER — Other Ambulatory Visit: Payer: Self-pay | Admitting: Internal Medicine

## 2011-10-03 ENCOUNTER — Ambulatory Visit (INDEPENDENT_AMBULATORY_CARE_PROVIDER_SITE_OTHER)
Admission: RE | Admit: 2011-10-03 | Discharge: 2011-10-03 | Disposition: A | Discharge status: Home / Self Care | Payer: Medicare Other | Source: Ambulatory Visit | Attending: Internal Medicine | Admitting: Internal Medicine

## 2011-10-03 ENCOUNTER — Encounter: Payer: Self-pay | Admitting: Internal Medicine

## 2011-10-03 ENCOUNTER — Ambulatory Visit (INDEPENDENT_AMBULATORY_CARE_PROVIDER_SITE_OTHER): Payer: Medicare Other | Admitting: Internal Medicine

## 2011-10-03 VITALS — BP 114/68 | HR 69 | Temp 98.4°F | Resp 12 | Ht 64.75 in | Wt 151.8 lb

## 2011-10-03 DIAGNOSIS — Z0000 Encounter for general adult medical examination without abnormal findings: Secondary | ICD-10-CM | POA: Diagnosis not present

## 2011-10-03 DIAGNOSIS — E039 Hypothyroidism, unspecified: Secondary | ICD-10-CM

## 2011-10-03 DIAGNOSIS — T887XXA Unspecified adverse effect of drug or medicament, initial encounter: Secondary | ICD-10-CM

## 2011-10-03 DIAGNOSIS — R071 Chest pain on breathing: Secondary | ICD-10-CM

## 2011-10-03 DIAGNOSIS — E782 Mixed hyperlipidemia: Secondary | ICD-10-CM | POA: Diagnosis not present

## 2011-10-03 DIAGNOSIS — I10 Essential (primary) hypertension: Secondary | ICD-10-CM

## 2011-10-03 DIAGNOSIS — R0781 Pleurodynia: Secondary | ICD-10-CM

## 2011-10-03 DIAGNOSIS — K5730 Diverticulosis of large intestine without perforation or abscess without bleeding: Secondary | ICD-10-CM | POA: Diagnosis not present

## 2011-10-03 DIAGNOSIS — M353 Polymyalgia rheumatica: Secondary | ICD-10-CM | POA: Diagnosis not present

## 2011-10-03 DIAGNOSIS — K589 Irritable bowel syndrome without diarrhea: Secondary | ICD-10-CM

## 2011-10-03 LAB — BASIC METABOLIC PANEL
CO2: 27 mEq/L (ref 19–32)
Chloride: 92 mEq/L — ABNORMAL LOW (ref 96–112)
Creatinine, Ser: 0.5 mg/dL (ref 0.4–1.2)
Potassium: 4 mEq/L (ref 3.5–5.1)
Sodium: 131 mEq/L — ABNORMAL LOW (ref 135–145)

## 2011-10-03 LAB — CBC WITH DIFFERENTIAL/PLATELET
Basophils Absolute: 0 10*3/uL (ref 0.0–0.1)
Basophils Relative: 0.3 % (ref 0.0–3.0)
Eosinophils Absolute: 0.1 10*3/uL (ref 0.0–0.7)
Eosinophils Relative: 0.8 % (ref 0.0–5.0)
Hemoglobin: 11.7 g/dL — ABNORMAL LOW (ref 12.0–15.0)
MCHC: 34.6 g/dL (ref 30.0–36.0)
MCV: 91.6 fl (ref 78.0–100.0)
Neutro Abs: 8.1 10*3/uL — ABNORMAL HIGH (ref 1.4–7.7)
Neutrophils Relative %: 76.7 % (ref 43.0–77.0)
RBC: 3.71 Mil/uL — ABNORMAL LOW (ref 3.87–5.11)
WBC: 10.5 10*3/uL (ref 4.5–10.5)

## 2011-10-03 LAB — HEPATIC FUNCTION PANEL: Albumin: 3.9 g/dL (ref 3.5–5.2)

## 2011-10-03 LAB — LIPID PANEL
Cholesterol: 151 mg/dL (ref 0–200)
HDL: 49.9 mg/dL (ref >39.00)
VLDL: 18.6 mg/dL (ref 0.0–40.0)

## 2011-10-03 NOTE — Patient Instructions (Signed)
Preventive Health Care: Exercise  30-45  minutes a day, 3-4 days a week. Walking is especially valuable in preventing Osteoporosis. Eat a low-fat diet with lots of fruits and vegetables, up to 7-9 servings per day. Consume less than 30 grams of sugar per day from foods & drinks with High Fructose Corn Syrup as #1,2,3 or #4 on label. Order for x-rays entered into  the computer; these will be performed at [facility]. across from [facility] No appointment is necessary.

## 2011-10-03 NOTE — Progress Notes (Signed)
Subjective:    Patient ID: [name] [name], female    DOB: Jul 13, 1935, 77 y.o.   MRN: [id number]  HPI Medicare Wellness Visit:  The following psychosocial & medical history were reviewed as required by Medicare.   Social history: caffeine: 2 medium cokes/ day , alcohol:  Very rarely ,  tobacco use : never & exercise : walking 3X/ week < 30 min.   Home & personal  safety / fall risk: no issues, activities of daily living: no limitations , seatbelt use : yes , and smoke alarm employment :yes .  Power of Attorney/Living Will status : in place  Vision ( as recorded per Nurse) & Hearing  evaluation :  Ophth exam overdue. Wall chart read @ 6 ft; slight decreased acuity to whisper  On R  @ 6 ft Orientation :orientation X 3 , memory & recall :good, spelling testing: good,and mood & affect : normal . Depression / anxiety: denied Travel history :1989 [place] , immunization status :Shingles & pneumonia not taken , transfusion history:  no, and preventive health surveillance ( colonoscopies, BMD , etc as per protocol/ [facility]): colonoscopy up to date, Dental care:  Seen every 6 months . Chart reviewed &  Updated. Active issues reviewed & addressed.       Review of Systems  For several weeks she's had intermittent right posterior thoracic pleuritic type pain with deep breathing. She denies exertional dyspnea or paroxysmal nocturnal dyspnea. She also denies productive cough or hemoptysis.  She is to see her endocrinologist at [facility] in may; ultrasound of the thyroid bed is scheduled this week     Objective:   Physical Exam Gen.: Healthy and well-nourished in appearance. Alert, appropriate and cooperative throughout exam.Appears younger than stated age  Head: Normocephalic without obvious abnormalities;   Eyes: No corneal or conjunctival inflammation noted. Pupils equal round reactive to light and accommodation.  Extraocular motion intact.  Ears: External  ear exam reveals no significant lesions or  deformities. Canals clear .TMs normal.  Nose: External nasal exam reveals no deformity or inflammation. Nasal mucosa are pink and moist. No lesions or exudates noted. Septum  Dislocated to L Mouth: Oral mucosa and oropharynx reveal no lesions or exudates. Teeth in good repair. Neck: No deformities, masses, or tenderness noted. Range of motion & Thyroid : post op changes Lungs: Normal respiratory effort; chest expands symmetrically. Lungs are clear to auscultation without rales, wheezes, or increased work of breathing. Heart: Normal rate and rhythm. Normal S1 and S2. No gallop, click, or rub. Grade 1/2 -1 over 6 systolic murmur  Abdomen: Bowel sounds normal; abdomen soft and nontender. No masses, organomegaly or hernias noted. Genitalia:Gyn(  Dr Jackquline Denmark) , appt March  .                                                                                   Musculoskeletal/extremities: No deformity or scoliosis noted of  the thoracic or lumbar spine. No clubbing, cyanosis, edema, or deformity noted. Range of motion  normal .Tone & strength  normal.Joints normal. Nail health  Good. Minor knee crepitus Vascular: Carotid, radial artery, dorsalis pedis and  posterior tibial pulses are full and equal.  No bruits present. Neurologic: Alert and oriented x3. Deep tendon reflexes symmetrical and normal.          Skin: Intact without suspicious lesions or rashes. Lymph: No cervical, axillary lymphadenopathy present. Psych: Mood and affect are normal. Normally interactive                                                                                        Assessment & Plan:  #1 Medicare Wellness Exam; criteria met ; data entered #2 Problem List reviewed ; Assessment/ Recommendations made  #3 pleuritic type chest pain; chest x-ray ordered Plan: see Orders

## 2011-10-03 NOTE — Assessment & Plan Note (Signed)
Lipids and hepatic profile indicated. CK should be checked because the statin therapy and because of a history of polymyalgia rheumatica.

## 2011-10-03 NOTE — Assessment & Plan Note (Addendum)
TSH will need to be suppressed because of the past history of thyroid cancer. Bone density will need to be monitored every 25 months because of this depression. She is followed @ [facility] by Dr Bufford Buttner

## 2011-10-03 NOTE — Assessment & Plan Note (Signed)
She remains on prednisone 2.5 mg daily. Sedimentation rate will be checked and the prednisone discontinued if possible

## 2011-10-03 NOTE — Assessment & Plan Note (Signed)
Her blood pressure has been well controlled with averages less than 135/85 home. BMET will be checked.

## 2011-10-07 DIAGNOSIS — Z85850 Personal history of malignant neoplasm of thyroid: Secondary | ICD-10-CM | POA: Diagnosis not present

## 2011-10-07 DIAGNOSIS — C73 Malignant neoplasm of thyroid gland: Secondary | ICD-10-CM | POA: Diagnosis not present

## 2011-10-25 DIAGNOSIS — N390 Urinary tract infection, site not specified: Secondary | ICD-10-CM | POA: Diagnosis not present

## 2011-11-01 ENCOUNTER — Other Ambulatory Visit: Payer: Self-pay | Admitting: Internal Medicine

## 2011-11-01 ENCOUNTER — Other Ambulatory Visit: Payer: Self-pay

## 2011-11-01 MED ORDER — CITALOPRAM HYDROBROMIDE 20 MG PO TABS: ORAL_TABLET | ORAL | Status: DC

## 2011-11-01 MED ORDER — PRAVASTATIN SODIUM 40 MG PO TABS: ORAL_TABLET | ORAL | Status: DC

## 2011-11-01 NOTE — Telephone Encounter (Addendum)
Pt would like to get year supply on all med sent in to pharmacy. Pt indicated that this was suppose to be done at [facility]. Pt need the Celexa and Pravastatin to go to [facility] in [place].

## 2011-11-04 DIAGNOSIS — Z124 Encounter for screening for malignant neoplasm of cervix: Secondary | ICD-10-CM | POA: Diagnosis not present

## 2011-11-05 ENCOUNTER — Other Ambulatory Visit: Payer: Self-pay | Admitting: Internal Medicine

## 2011-11-07 NOTE — Telephone Encounter (Signed)
Prescription sent to pharmacy.

## 2011-11-14 DIAGNOSIS — N390 Urinary tract infection, site not specified: Secondary | ICD-10-CM | POA: Diagnosis not present

## 2011-11-21 ENCOUNTER — Other Ambulatory Visit (INDEPENDENT_AMBULATORY_CARE_PROVIDER_SITE_OTHER): Payer: Medicare Other

## 2011-11-21 DIAGNOSIS — Z1289 Encounter for screening for malignant neoplasm of other sites: Secondary | ICD-10-CM

## 2011-11-21 LAB — POC HEMOCCULT BLD/STL (OFFICE/1-CARD/DIAGNOSTIC): Fecal Occult Blood, POC: NEGATIVE

## 2011-11-22 ENCOUNTER — Encounter: Payer: Self-pay | Admitting: Internal Medicine

## 2011-11-22 ENCOUNTER — Ambulatory Visit (INDEPENDENT_AMBULATORY_CARE_PROVIDER_SITE_OTHER): Payer: Medicare Other | Admitting: Internal Medicine

## 2011-11-22 VITALS — BP 122/72 | HR 71 | Ht 67.4 in | Wt 151.2 lb

## 2011-11-22 DIAGNOSIS — K589 Irritable bowel syndrome without diarrhea: Secondary | ICD-10-CM

## 2011-11-22 DIAGNOSIS — R1900 Intra-abdominal and pelvic swelling, mass and lump, unspecified site: Secondary | ICD-10-CM

## 2011-11-22 MED ORDER — ALIGN 4 MG PO CAPS: 1.0000 | ORAL_CAPSULE | ORAL | Status: DC

## 2011-11-22 NOTE — Patient Instructions (Signed)
We have given you samples of Align. This puts good bacteria back into your intestines. You should take 1 capsule by mouth once daily. If this works well for you, it can be purchased over the counter. You should have some refills on Bentyl available at the pharmacy. You will be due for a recall colonoscopy in 02/2013. We will send you a reminder in the mail when it gets closer to that time. CC: Dr Marga Melnick

## 2011-11-22 NOTE — Progress Notes (Signed)
[name] [name] [birth year] MRN [id number]   History of Present Illness:  This is a 77 year old white female with diarrhea predominant irritable bowel syndrome. Her last appointment was in April 2012. Her last colonoscopy in June 2004 was incomplete due to a tortuous and redundant colon. A subsequent barium enema confirmed marked tortuosity of the sigmoid and transverse colon. She is currently on Bentyl 20 mg twice a day with good response. There is a history of a cystic rectal mass by MRI in the past. Her last MRI in March 2001 showed a stable pelvic cystic mass measuring 5 x 7 cm consistent with a lymphocele. She has rectal sphincter tone dysfunction. She has a history of a thyroidectomy in 1988.    Past Medical History  Diagnosis Date  . Chest pain 2000 and 2001    negative cardiac workup  . Perirectal cyst     ?lymphocoele  . Thyroid cancer     S/P radiation & surgery  . Hypothyroidism     post operative  . Diverticulosis   . Hyperlipidemia   . Hypertension   . Irritable bowel syndrome   . Fibrocystic breast disease   . Squamous cell skin cancer   . Seborrheic dermatitis   . Allergic angioedema     from shellfish and tree nuts  . PMR (polymyalgia rheumatica) 2011   Past Surgical History  Procedure Date  . Thyroidectomy 1988    S/P radiation previously seen by Levonne Hubert, MD @ [facility], now followed by Dr. Uvaldo Rising  . Dilation and curettage of uterus   . Tonsillectomy 1972  . Exporatory surgery for rectal mass 1986    peri rectal cyst  . Colonoscopy 2004     incomplete; Barium study added    reports that she has never smoked. She has never used smokeless tobacco. She reports that she drinks alcohol. She reports that she does not use illicit drugs. family history includes Breast cancer in her maternal grandmother; Heart attack (age of onset:52) in her paternal uncle; Heart attack (age of onset:61) in her paternal grandfather; Hypertension in her maternal grandmother  and mother; Parkinsonism in her brother; and Prostate cancer in her father. Allergies  Allergen Reactions  . Bacitracin-Polymyxin B   . Eggs Or Egg-Derived Products     REACTION: Unable to get Flu Vaccine  . Horse-Derived Products   . Penicillins   . Shellfish-Derived Products   . Triple Antibiotic         Review of Systems: Denies abdominal pain dysphagia odynophagia  The remainder of the 10 point ROS is negative except as outlined in H&P   Physical Exam: General appearance  Well developed, in no distress. Eyes- non icteric. HEENT nontraumatic, normocephalic. Mouth no lesions, tongue papillated, no cheilosis. Neck supple without adenopathy, thyroid not enlarged, no carotid bruits, no JVD. Lungs Clear to auscultation bilaterally. Cor normal S1, normal S2, regular rhythm, no murmur,  quiet precordium. Abdomen: Soft abdomen with normal active bowel sounds. No distention. No tympany. Well-healed surgical scar. Rectal: Decreased rectal sphincter tone. Stool is Hemoccult negative. Extremities no pedal edema. Skin no lesions. Neurological alert and oriented x 3. Psychological normal mood and affect.  Assessment and Plan:  Problem #1 Irritable bowel syndrome with predominant diarrhea which is well-controlled on bentyl 20 mg twice a day. She will need a refill of it. She will be due for a recall colonoscopy in June 2014. She will need propofol. Her last colonoscopy showed a tortuous redundant colon. This was a  difficult exam. I will see her in one year.  Pelvic cystic mass- radiographically stable, c/w lymphocele. No follow up necessary   11/22/2011 [name] [name]

## 2011-12-02 ENCOUNTER — Other Ambulatory Visit: Payer: Self-pay | Admitting: Internal Medicine

## 2011-12-05 DIAGNOSIS — Z79899 Other long term (current) drug therapy: Secondary | ICD-10-CM | POA: Diagnosis not present

## 2011-12-05 DIAGNOSIS — E039 Hypothyroidism, unspecified: Secondary | ICD-10-CM | POA: Diagnosis not present

## 2011-12-06 ENCOUNTER — Telehealth: Payer: Self-pay | Admitting: Internal Medicine

## 2011-12-06 NOTE — Telephone Encounter (Signed)
Gina from [facility] in [place] called to request that we fill this pt's rx with the brand name, not the generic. She states the pt used to take brand name and somehow switched to generic, but pt would like to have brand. Call back # [phone]

## 2011-12-06 NOTE — Telephone Encounter (Signed)
I called Gina at the pharmacy and ok'd Brand name since patient is requesting

## 2011-12-07 ENCOUNTER — Encounter: Payer: Self-pay | Admitting: Internal Medicine

## 2011-12-07 ENCOUNTER — Ambulatory Visit (INDEPENDENT_AMBULATORY_CARE_PROVIDER_SITE_OTHER): Payer: Medicare Other | Admitting: Internal Medicine

## 2011-12-07 VITALS — BP 128/80 | HR 66 | Temp 98.1°F | Wt 152.0 lb

## 2011-12-07 DIAGNOSIS — M353 Polymyalgia rheumatica: Secondary | ICD-10-CM | POA: Diagnosis not present

## 2011-12-07 DIAGNOSIS — E039 Hypothyroidism, unspecified: Secondary | ICD-10-CM | POA: Diagnosis not present

## 2011-12-07 DIAGNOSIS — E871 Hypo-osmolality and hyponatremia: Secondary | ICD-10-CM

## 2011-12-07 DIAGNOSIS — D649 Anemia, unspecified: Secondary | ICD-10-CM

## 2011-12-07 DIAGNOSIS — Z85850 Personal history of malignant neoplasm of thyroid: Secondary | ICD-10-CM

## 2011-12-07 LAB — CBC WITH DIFFERENTIAL/PLATELET
Basophils Relative: 0.3 % (ref 0.0–3.0)
Eosinophils Relative: 1.9 % (ref 0.0–5.0)
HCT: 35.9 % — ABNORMAL LOW (ref 36.0–46.0)
Lymphs Abs: 1.9 10*3/uL (ref 0.7–4.0)
MCV: 90.2 fl (ref 78.0–100.0)
Monocytes Absolute: 0.6 10*3/uL (ref 0.1–1.0)
RBC: 3.98 Mil/uL (ref 3.87–5.11)
WBC: 7.8 10*3/uL (ref 4.5–10.5)

## 2011-12-07 LAB — BASIC METABOLIC PANEL
Chloride: 95 mEq/L — ABNORMAL LOW (ref 96–112)
Potassium: 4.3 mEq/L (ref 3.5–5.1)
Sodium: 132 mEq/L — ABNORMAL LOW (ref 135–145)

## 2011-12-07 LAB — IBC PANEL: Transferrin: 213.5 mg/dL (ref 212.0–360.0)

## 2011-12-07 NOTE — Assessment & Plan Note (Signed)
Thyroid US 2/13 versus ultrasound 10/05/09: Status post thyroidectomy with no abnormal soft tissue identified in the thyroid bed. 2 right-sided lymph nodes present; 1 is stable compared to prior exam a second  is apparently new. There is a stable appearing left-sided lymph node. Ultrasound F/U recommended in 12 months by Dr. Corliss Blacker, [facility]

## 2011-12-07 NOTE — Patient Instructions (Signed)
Please  schedule fasting Labs in 10 weeks: BMET, CBC & dif, TSH, sed rate. PLEASE BRING THESE INSTRUCTIONS TO FOLLOW UP  LAB APPOINTMENT.This will guarantee correct labs are drawn, eliminating need for repeat blood sampling ( needle sticks ! ). Diagnoses /Codes: 244.9,285.9,PMR,hyponatremia.  Please try to go on My Chart within the next 24 hours to allow me to release the results directly to you.

## 2011-12-07 NOTE — Progress Notes (Signed)
  Subjective:    Patient ID: [name] [name], female    DOB: [birth year], 77 y.o.   MRN: [id number]  HPI Apparently due to a shortage of Synthroid her thyroid supplement was changed to generic in December 2012. TSH was 0.03 on 10/03/2011. Ultrasound report performed at [facility] was reviewed. It is felt to be essentially stable and followup is recommended in 12 months by Dr. Corliss Blacker.  Her thyroid dose was decreased and followup TSH had been recommended in 10 weeks.  She denies blurred vision, double vision, or loss of vision. Weight has been stable without bowel changes. She does have cold intolerance. She denies palpitations.  Review of Systems She describes intermittent joint stiffness. She denies significant pain in the neck or shoulder girdle or the hip girdle. She denies swelling or redness in the joints.  On 10/29/10 sedimentation rate was 20. She self  adjusted her steroids at that time. She has apparently been on 2.5 mg daily for the last 6 months. She stopped that approximately 2 weeks ago. Her sedimentation rate was 44 on 10/03/11.  Last bone density was less than 12 months ago  Her sodium was 131; hematocrit 33.9 on 10/03/11. She denies abdominal pain, melena or rectal bleeding.      Objective:   Physical Exam Gen.: Healthy and well-nourished in appearance. Alert, appropriate and cooperative throughout exam.  Eyes: No corneal or conjunctival inflammation noted.  Extraocular motion intact. No lid lag or proptosis.  Neck: No deformities, masses, or tenderness noted. Range of motion  normal .Thyroid absent. Lungs: Normal respiratory effort; chest expands symmetrically. Lungs are clear to auscultation without rales, wheezes, or increased work of breathing. Heart: Normal rate and rhythm. Normal S1 and S2. No gallop, click, or rub. S4 with slurring ;no murmur. Abdomen: Bowel sounds normal; abdomen soft and nontender. No masses, organomegaly or hernias noted.                                           Musculoskeletal/extremities:  No clubbing, cyanosis, edema, or deformity noted. Range of motion  normal .Tone & strength  normal.Joints normal. Nail health  Good; no onycholysis. Vascular: Carotid, radial artery, dorsalis pedis and  posterior tibial pulses are full and equal. No bruits present. Neurologic: Alert and oriented x3. Deep tendon reflexes symmetrical and normal.          Skin: Intact without suspicious lesions or rashes. Lymph: No cervical, axillary lymphadenopathy present. Psych: Mood and affect are normal. Normally interactive                                                                                         Assessment & Plan:  #1 mild anemia; labs will be checked .She completed  stool cards in 01/13. By history colonoscopy is up-to-date  #2 mild hyponatremia; she is on spironolactone and has been on low dose steroids. Sodium will be rechecked  #3 see problem list assessments and recommendations

## 2011-12-07 NOTE — Assessment & Plan Note (Signed)
Clinically she is essentially asymptomatic but the sedimentation rate has risen to 44. We can monitor for symptoms and repeat the sedimentation rate in 10 weeks with a TSH

## 2011-12-14 ENCOUNTER — Telehealth: Payer: Self-pay

## 2011-12-14 NOTE — Telephone Encounter (Signed)
MSG from patient who stated she does not have a computer and she would like a copy of her labs to be mailed to here when they come back even though she is active on my chart, she also lost her activation information.     KP

## 2011-12-14 NOTE — Telephone Encounter (Signed)
Noted  

## 2012-01-06 ENCOUNTER — Other Ambulatory Visit (INDEPENDENT_AMBULATORY_CARE_PROVIDER_SITE_OTHER): Payer: Medicare Other

## 2012-01-06 DIAGNOSIS — Z1289 Encounter for screening for malignant neoplasm of other sites: Secondary | ICD-10-CM | POA: Diagnosis not present

## 2012-01-06 LAB — POC HEMOCCULT BLD/STL (OFFICE/1-CARD/DIAGNOSTIC): Fecal Occult Blood, POC: NEGATIVE

## 2012-02-06 DIAGNOSIS — M1710 Unilateral primary osteoarthritis, unspecified knee: Secondary | ICD-10-CM | POA: Diagnosis not present

## 2012-03-19 DIAGNOSIS — E039 Hypothyroidism, unspecified: Secondary | ICD-10-CM | POA: Diagnosis not present

## 2012-04-10 ENCOUNTER — Ambulatory Visit (INDEPENDENT_AMBULATORY_CARE_PROVIDER_SITE_OTHER): Payer: Medicare Other | Admitting: Internal Medicine

## 2012-04-10 ENCOUNTER — Encounter: Payer: Self-pay | Admitting: Internal Medicine

## 2012-04-10 VITALS — BP 114/70 | HR 69 | Temp 97.8°F | Wt 150.2 lb

## 2012-04-10 DIAGNOSIS — Z85850 Personal history of malignant neoplasm of thyroid: Secondary | ICD-10-CM | POA: Diagnosis not present

## 2012-04-10 NOTE — Progress Notes (Signed)
  Subjective:    Patient ID: [name] [name], female    DOB: Jan 24, 1935, 77 y.o.   MRN: [id number]  HPI  MS. [name] Luz, PA at [facility] adjusted her thyroid dose to one half pill Saturday and Sunday and one pill Monday-Friday because of a TSH of 0.09 in April of this year. Thyroid suppression was felt to be excessive. This was actually the recommended dose but she admits that she was "not consistent".  Followup TSH on 03/19/12 revealed a value of 2.07 on a consistent compliance with the dose as above..    Review of Systems She does have some cold intolerance; otherwise thyroid review of systems is negative. She specifically denies significant weight change, fatigue, sleep disruption, anorexia, blurred or double vision, hoarseness, dysphagia, palpitations or tachycardia, changes in hair or nails, tremor, or numbness or tingling. She does have irritable bowel syndrome with associated bowel changes.Align has helped her symptoms        Objective:   Physical Exam Gen.:  well-nourished; in no acute distress Eyes: Extraocular motion intact; no lid lag or proptosis Neck: thyroid absent Heart: Normal rhythm and rate without significant murmur, gallop, or extra heart sounds Lungs: Chest clear to auscultation without rales,rales, wheezes Neuro:Deep tendon reflexes are equal and within normal limits; no tremor  Skin: Warm and dry without significant lesions or rashes; no onycholysis Psych: Normally communicative and interactive; no abnormal mood or affect clinically.         Assessment & Plan:

## 2012-04-10 NOTE — Patient Instructions (Addendum)
Please  schedule  Labs :  TSH in 10-12 weeks. PLEASE BRING THESE INSTRUCTIONS TO FOLLOW UP  LAB APPOINTMENT.This will guarantee correct labs are drawn, eliminating need for repeat blood sampling ( needle sticks ! ). Diagnoses /Codes: 244.9. Please try to go on My Chart  to allow me to release the results directly to you.

## 2012-04-10 NOTE — Assessment & Plan Note (Signed)
Her TSH goal is 0.1-0.3. Her present dose of thyroid will be increased to one pill everyday except a half a pill on Sunday only. It is recommended that TSH be checked in 10-12 weeks.

## 2012-04-22 IMAGING — CR DG CHEST 2V
2 series · 2 of 2 positions shown · non-contrast
Comparison: None

CLINICAL DATA: Posterior pleuritic chest pain.

CHEST - 2 VIEW

[view not recorded (1 of 2)]
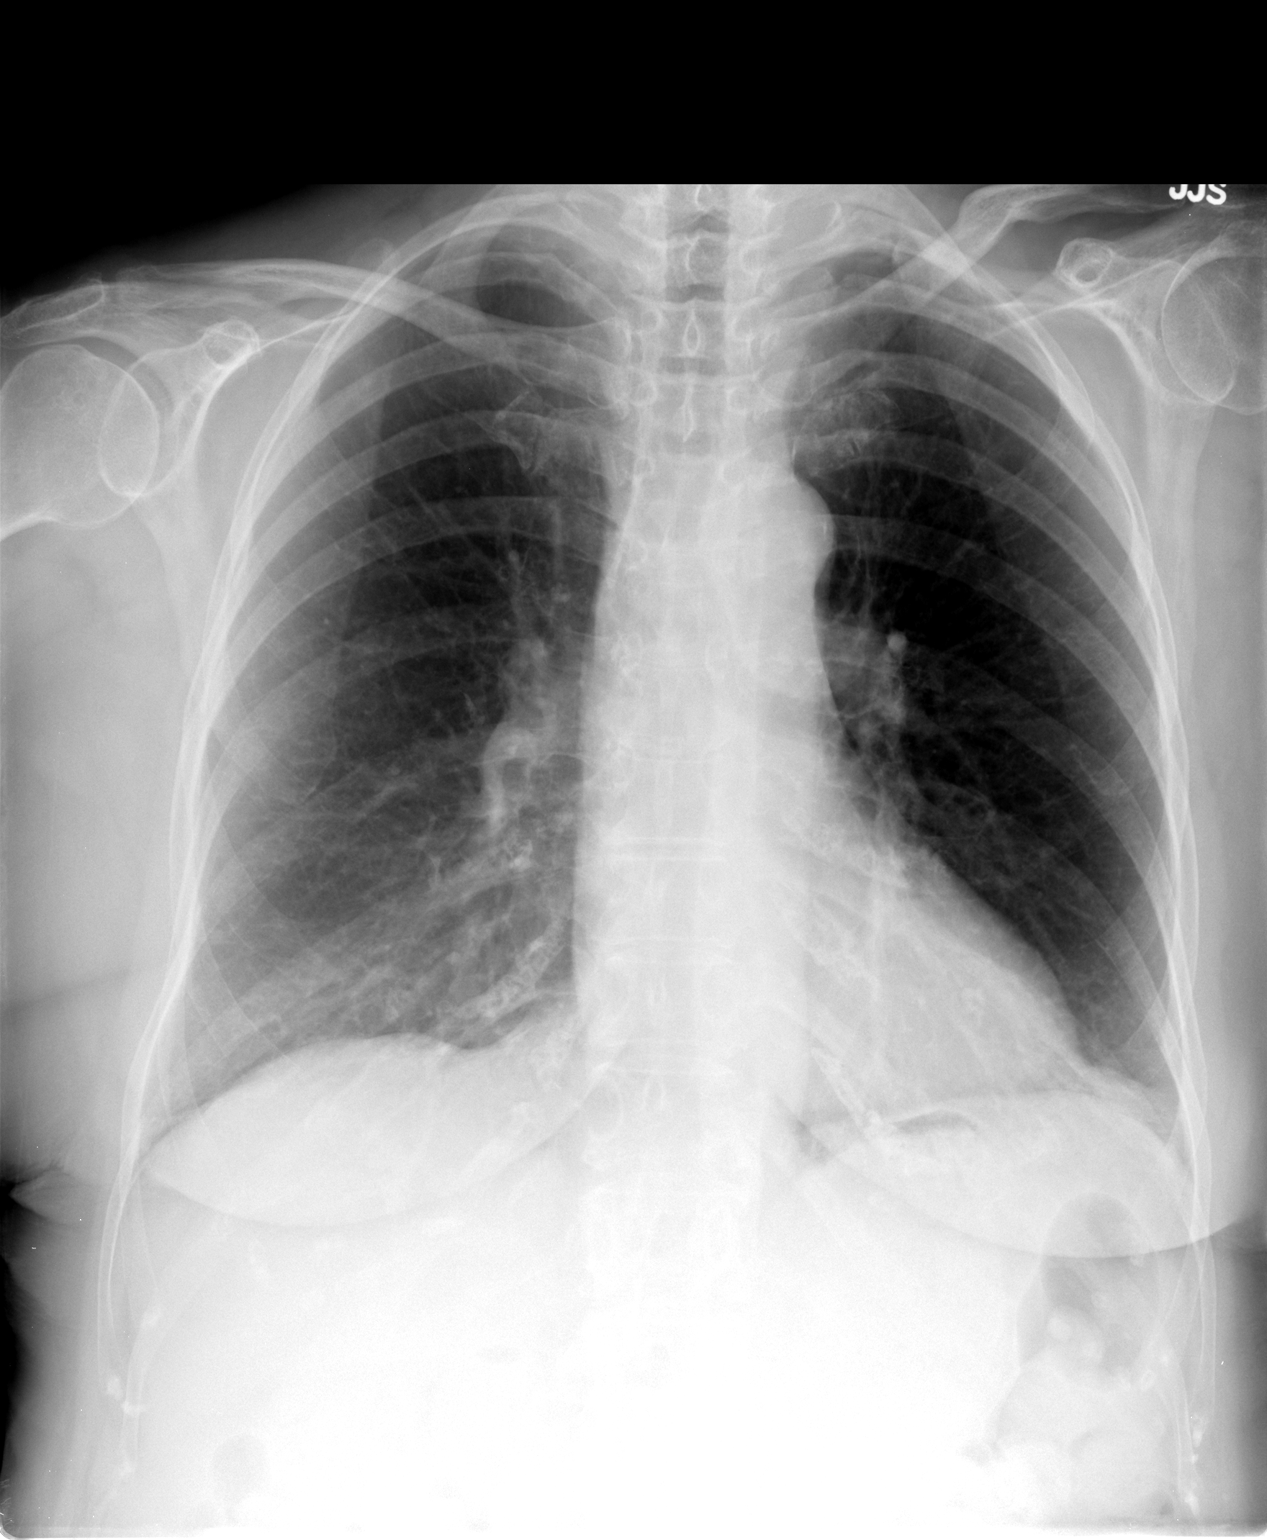

[view not recorded (2 of 2)]
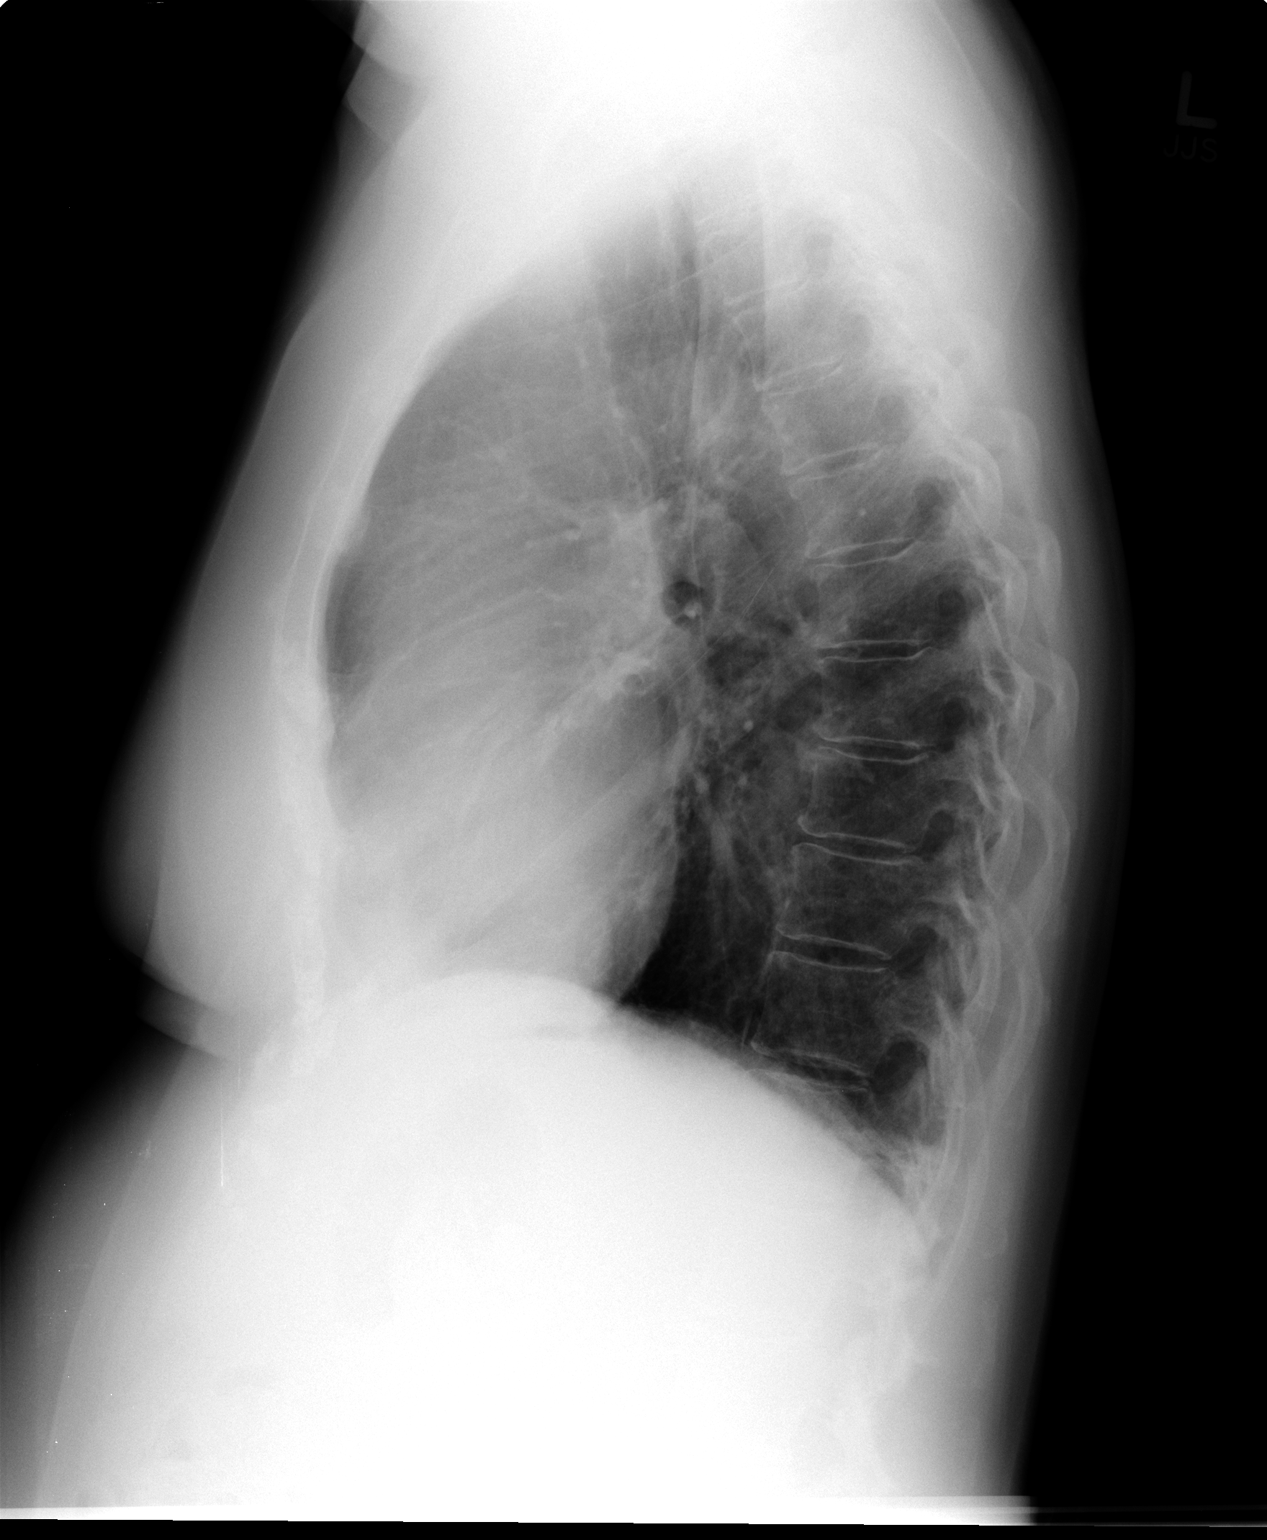

[2 of 2 positions shown; findings below may reference images not displayed]

FINDINGS: Minimal bibasilar densities, likely atelectasis.  Heart
is normal size.  No effusions.  No acute bony abnormality.
IMPRESSION: Bibasilar densities, likely minimal atelectasis.

## 2012-05-21 DIAGNOSIS — Z1231 Encounter for screening mammogram for malignant neoplasm of breast: Secondary | ICD-10-CM | POA: Diagnosis not present

## 2012-06-28 ENCOUNTER — Telehealth: Payer: Self-pay | Admitting: Internal Medicine

## 2012-06-28 ENCOUNTER — Other Ambulatory Visit (INDEPENDENT_AMBULATORY_CARE_PROVIDER_SITE_OTHER): Payer: Medicare Other

## 2012-06-28 DIAGNOSIS — E039 Hypothyroidism, unspecified: Secondary | ICD-10-CM | POA: Diagnosis not present

## 2012-06-28 NOTE — Telephone Encounter (Signed)
Pt was referred to my counter from front desk staff regarding a bill she received from [facility] health for a balance due of $223.00, from date of service 1.28.13-Annual Wellness Exam  Balance is for $78.00 EKG & $145.00 additional E/M service charged for patient complaint of chest pain Pt also had her CPE which medicare covered entirely, but advised pt on explanation of benefits EKG & Additional visit was not covered under her plan.  Advised pt we bill only want is legally billable, gave her Preventative exam notice that should be given to all patients coming in for complete physicals. PT., advised she did not get and that she advise both nurse & Physician to only do what medicare would pay for. I  Advised pt., dr/clinical staff perform medical procedures providing only the best quality of care for the patient based on type of visit and patient statements regarding current health conditions and do not have billing knowledge of insurance plans whether they are private insurance carriers, Medicare or supplements.   Pt stated had she know she would have made another appt to come back in for chest pains so she would have only had to pay a co-pay & the way it stands now she will  Have to pay $225.00.  I gave patient the ph# to patient financial assistance and told her they may be able to work out something with her based on her current financial situation. She understood but wants the dr. To know so that the next time he will let her come back for any other conditions so she will only have to pay a copay  Copies have been made and all will be furnished to [facility] for review with Provider(s)

## 2012-06-29 ENCOUNTER — Other Ambulatory Visit: Payer: Self-pay | Admitting: Internal Medicine

## 2012-07-30 ENCOUNTER — Other Ambulatory Visit: Payer: Self-pay | Admitting: Internal Medicine

## 2012-07-31 ENCOUNTER — Other Ambulatory Visit: Payer: Self-pay | Admitting: Internal Medicine

## 2012-10-20 ENCOUNTER — Other Ambulatory Visit: Payer: Self-pay

## 2012-10-31 ENCOUNTER — Other Ambulatory Visit: Payer: Self-pay | Admitting: Internal Medicine

## 2012-10-31 NOTE — Telephone Encounter (Signed)
Lipid/Hep 272.4/995.20  

## 2012-11-07 ENCOUNTER — Other Ambulatory Visit: Payer: Self-pay | Admitting: Internal Medicine

## 2012-12-17 DIAGNOSIS — E039 Hypothyroidism, unspecified: Secondary | ICD-10-CM | POA: Diagnosis not present

## 2013-01-22 ENCOUNTER — Encounter: Payer: Self-pay | Admitting: Internal Medicine

## 2013-01-24 DIAGNOSIS — N390 Urinary tract infection, site not specified: Secondary | ICD-10-CM | POA: Diagnosis not present

## 2013-01-29 ENCOUNTER — Other Ambulatory Visit: Payer: Self-pay | Admitting: Internal Medicine

## 2013-01-31 ENCOUNTER — Other Ambulatory Visit: Payer: Self-pay | Admitting: Internal Medicine

## 2013-02-01 NOTE — Telephone Encounter (Signed)
Lipid/Hep 272.4/995.20  

## 2013-02-22 ENCOUNTER — Other Ambulatory Visit: Payer: Self-pay | Admitting: Internal Medicine

## 2013-02-22 NOTE — Telephone Encounter (Signed)
Refill done.  

## 2013-02-27 ENCOUNTER — Other Ambulatory Visit: Payer: Self-pay | Admitting: Internal Medicine

## 2013-03-06 ENCOUNTER — Other Ambulatory Visit: Payer: Self-pay | Admitting: Internal Medicine

## 2013-03-21 ENCOUNTER — Encounter: Payer: Self-pay | Admitting: Internal Medicine

## 2013-04-02 ENCOUNTER — Other Ambulatory Visit: Payer: Self-pay | Admitting: Internal Medicine

## 2013-04-02 ENCOUNTER — Ambulatory Visit (INDEPENDENT_AMBULATORY_CARE_PROVIDER_SITE_OTHER): Payer: Medicare Other | Admitting: Internal Medicine

## 2013-04-02 ENCOUNTER — Encounter: Payer: Self-pay | Admitting: Internal Medicine

## 2013-04-02 VITALS — BP 102/68 | HR 60 | Ht 65.0 in | Wt 149.2 lb

## 2013-04-02 DIAGNOSIS — K589 Irritable bowel syndrome without diarrhea: Secondary | ICD-10-CM | POA: Diagnosis not present

## 2013-04-02 MED ORDER — DICYCLOMINE HCL 20 MG PO TABS: 20.0000 mg | ORAL_TABLET | Freq: Two times a day (BID) | ORAL | Status: DC

## 2013-04-02 NOTE — Patient Instructions (Addendum)
We have sent the following medications to your pharmacy for you to pick up at your convenience: Bentyl  It has been recommended to you by your physician that you have a(n) colonoscopy completed. Per your request, we did not schedule the procedure(s) today. Please contact our office at [phone] should/when you decide to have the procedure completed.  CC: Dr Marga Melnick

## 2013-04-02 NOTE — Progress Notes (Signed)
[name] [name] 1935-03-06 MRN [id number]   History of Present Illness:  This is a 78 year old white female who comes for refills of Bentyl. Her last office visit with me was in March 2013. She has had diarrhea predominant IBS which has in recent years changed into constipation predominant IBS. Her last colonoscopy was very difficult in April 2004 and it was incomplete due to a tortuous and redundant colon. A subsequent barium enema was normal except for tortuosity of the colon. She was also followed for a cystic perirectal mass which was followed by MRIs and was stable. Her last exam was in 2001. It was diagnosed as lymphocele 5x7 cm size. She has no specific complaints today other than occasional diarrhea. She takes probiotics daily. She had a thyroidectomy for cancer in 1988.   Past Medical History  Diagnosis Date  . Chest pain 2000 and 2001    negative cardiac workup  . Perirectal cyst     ?lymphocoele  . Diverticulosis   . Hyperlipidemia   . Hypertension   . Irritable bowel syndrome   . Fibrocystic breast disease   . Squamous cell skin cancer   . Seborrheic dermatitis   . Allergic angioedema     from shellfish and tree nuts  . PMR (polymyalgia rheumatica) 2011  . Thyroid disease     post op hypothyroidism; TSH goal would be sup;pressive   Past Surgical History  Procedure Laterality Date  . Thyroidectomy  1988    S/P radiation previously seen by Levonne Hubert, MD @ [facility], now followed by Dr. Uvaldo Rising  . Dilation and curettage of uterus    . Tonsillectomy  1972  . Exporatory surgery for rectal mass  1986    peri rectal cyst  . Colonoscopy  2004     incomplete; Barium study added    reports that she has never smoked. She has never used smokeless tobacco. She reports that  drinks alcohol. She reports that she does not use illicit drugs. family history includes Breast cancer in her maternal grandmother; Heart attack (age of onset: 57) in her paternal uncle; Heart  attack (age of onset: 64) in her paternal grandfather; Hypertension in her maternal grandmother and mother; Parkinsonism in her brother; and Prostate cancer in her father. Allergies  Allergen Reactions  . Horse-Derived Products     Redness & swelling of arm  . Penicillins     Rash   . Shellfish-Derived Products     Throat swelling  . Bacitracin-Polymyxin B     Local swelling  . Eggs Or Egg-Derived Products     REACTION: Unable to get Flu Vaccine Skin Tests+ for egg products  . Neomycin-Bacitracin Zn-Polymyx         Review of Systems: Denies heartburn dysphagia. Weight has been stable  The remainder of the 10 point ROS is negative except as outlined in H&P   Physical Exam: General appearance  Well developed, in no distress. Eyes- non icteric. HEENT nontraumatic, normocephalic. Mouth no lesions, tongue papillated, no cheilosis. Neck supple without adenopathy, thyroid not enlarged, no carotid bruits, no JVD. Lungs Clear to auscultation bilaterally. Cor normal S1, normal S2, regular rhythm, no murmur,  quiet precordium. Abdomen: Soft nontender abdomen with normal active bowel sounds. No distention. Liver edge at costal margin. Healed surgical scars. Rectal: Soft Hemoccult negative stool. Extremities no pedal edema. Skin no lesions. Neurological alert and oriented x 3. Psychological normal mood and affect.  Assessment and Plan:  Problem #76 78 year old white female with  irritable bowel syndrome. She is currently off Bentyl because she ran out of it but she has not noticed any change in her bowel habits. She will stay off Bentyl and take it only as necessary for diarrhea and colon spasm. I have given her samples of probiotics which she can also taken when necessary. She is due for a colonoscopy. Her last exam was 10 years ago. She states she will call us with a date that she can come once she finds a ride (She lives in [place], [place]).   04/02/2013 [name]

## 2013-04-09 ENCOUNTER — Other Ambulatory Visit: Payer: Self-pay | Admitting: Internal Medicine

## 2013-04-22 ENCOUNTER — Encounter: Payer: Self-pay | Admitting: Internal Medicine

## 2013-04-22 ENCOUNTER — Ambulatory Visit (INDEPENDENT_AMBULATORY_CARE_PROVIDER_SITE_OTHER): Payer: Medicare Other | Admitting: Internal Medicine

## 2013-04-22 VITALS — BP 110/68 | HR 55 | Temp 97.8°F | Ht 64.5 in | Wt 148.0 lb

## 2013-04-22 DIAGNOSIS — E782 Mixed hyperlipidemia: Secondary | ICD-10-CM

## 2013-04-22 DIAGNOSIS — E039 Hypothyroidism, unspecified: Secondary | ICD-10-CM | POA: Diagnosis not present

## 2013-04-22 DIAGNOSIS — Z0000 Encounter for general adult medical examination without abnormal findings: Secondary | ICD-10-CM | POA: Diagnosis not present

## 2013-04-22 DIAGNOSIS — K5730 Diverticulosis of large intestine without perforation or abscess without bleeding: Secondary | ICD-10-CM

## 2013-04-22 DIAGNOSIS — Z85850 Personal history of malignant neoplasm of thyroid: Secondary | ICD-10-CM

## 2013-04-22 LAB — BASIC METABOLIC PANEL
BUN: 7 mg/dL (ref 6–23)
CO2: 27 mEq/L (ref 19–32)
Calcium: 9.3 mg/dL (ref 8.4–10.5)
Chloride: 91 mEq/L — ABNORMAL LOW (ref 96–112)
Creatinine, Ser: 0.5 mg/dL (ref 0.4–1.2)

## 2013-04-22 LAB — CBC WITH DIFFERENTIAL/PLATELET
Basophils Absolute: 0 10*3/uL (ref 0.0–0.1)
Basophils Relative: 0.2 % (ref 0.0–3.0)
Eosinophils Absolute: 0.2 10*3/uL (ref 0.0–0.7)
Lymphocytes Relative: 28.5 % (ref 12.0–46.0)
MCHC: 34.7 g/dL (ref 30.0–36.0)
MCV: 91.7 fl (ref 78.0–100.0)
Monocytes Absolute: 0.5 10*3/uL (ref 0.1–1.0)
Neutrophils Relative %: 60.7 % (ref 43.0–77.0)
Platelets: 291 10*3/uL (ref 150.0–400.0)
RBC: 4.03 Mil/uL (ref 3.87–5.11)
WBC: 6.7 10*3/uL (ref 4.5–10.5)

## 2013-04-22 LAB — HEPATIC FUNCTION PANEL
Alkaline Phosphatase: 55 U/L (ref 39–117)
Bilirubin, Direct: 0 mg/dL (ref 0.0–0.3)
Total Bilirubin: 0.7 mg/dL (ref 0.3–1.2)

## 2013-04-22 LAB — LIPID PANEL
HDL: 41.4 mg/dL (ref >39.00)
LDL Cholesterol: 94 mg/dL (ref 0–99)
Total CHOL/HDL Ratio: 4
Triglycerides: 118 mg/dL (ref 0.0–149.0)
VLDL: 23.6 mg/dL (ref 0.0–40.0)

## 2013-04-22 MED ORDER — SPIRONOLACTONE 25 MG PO TABS: 25.0000 mg | ORAL_TABLET | Freq: Every day | ORAL | Status: DC

## 2013-04-22 MED ORDER — DICYCLOMINE HCL 20 MG PO TABS: 20.0000 mg | ORAL_TABLET | Freq: Two times a day (BID) | ORAL | Status: DC

## 2013-04-22 MED ORDER — CITALOPRAM HYDROBROMIDE 20 MG PO TABS: ORAL_TABLET | ORAL | Status: DC

## 2013-04-22 MED ORDER — LEVOTHYROXINE SODIUM 100 MCG PO TABS: ORAL_TABLET | ORAL | Status: DC

## 2013-04-22 MED ORDER — PRAVASTATIN SODIUM 40 MG PO TABS: ORAL_TABLET | ORAL | Status: DC

## 2013-04-22 MED ORDER — METOPROLOL TARTRATE 100 MG PO TABS: ORAL_TABLET | ORAL | Status: DC

## 2013-04-22 NOTE — Patient Instructions (Addendum)
If you activate the  My Chart system; lab & Xray results will be released directly  to you as soon as I review & address these through the computer. If you choose not to sign up for My Chart within 36 hours of labs being drawn; results will be reviewed & interpretation added before being copied & mailed, causing a delay in getting the results to you.If you do not receive that report within 7-10 days ,please call. Additionally you can use this system to gain direct  access to your records  if  out of town or @ an office of a  physician who is not in  the My Chart network.  This improves continuity of care & places you in control of your medical record.  Share results with all non [facility] medical staff seen  

## 2013-04-22 NOTE — Progress Notes (Signed)
Subjective:    Patient ID: [name] [name], female    DOB: [birth year], 78 y.o.   MRN: [id number]  HPI Medicare Wellness Visit:  Psychosocial & medical history were reviewed as required by Medicare (abuse,antisocial behavioral risks,firearm risk).  Social history: caffeine:  1 cola/ day , alcohol:very rarely   ,  tobacco use:  never Exercise :  See below Home & personal  safety / fall risk:no Limitations of activities of daily living:no Seatbelt  and smoke alarm use:yes Power of Attorney/Living Will status : in place Ophthalmology exam status :overdue Hearing evaluation status: not current Orientation :oriented X 3 Memory & recall :good Math testing:good Active depression / anxiety:denied Foreign travel history : [place] Immunization status for Shingles /Flu/ PNA/ tetanus : egg allergy Transfusion history:  no Preventive health surveillance status of colonoscopy, BMD , mammograms,PAP as per protocol/ SOC: current Dental care: every 6 mos  Chart reviewed &  Updated. Active issues reviewed & addressed.      Review of Systems    She is on a low salt,heart healthy diet; she exercises as walking 1/2 mpd   3 times per week without symptoms. Specifically she denies chest pain, palpitations, dyspnea, or claudication.  Family history is positive for premature coronary disease in her paternal uncle @ 50. Advanced cholesterol testing reveals her LDL goal is less than 130, ideally < 100. There is medication compliance with the statin. Significant abdominal symptoms, memory deficit, or myalgias denied. Home blood pressure  average 115/69.Patient is compliant with  BP medications w/o  adverse effects noted .             Objective:   Physical Exam Gen.:  well-nourished in appearance. Alert, appropriate and cooperative throughout exam.Appears younger than stated age  Head: Normocephalic without obvious abnormalities  Eyes: No corneal or conjunctival inflammation noted. Pupils  equal round reactive to light and accommodation.  Extraocular motion intact. Vision grossly normal with lenses Ears: External  ear exam reveals no significant lesions or deformities. Canals clear .TMs normal. Hearing is grossly decreased on R. Nose: External nasal exam reveals no deformity or inflammation. Nasal mucosa are pink and moist. No lesions or exudates noted.  Mouth: Oral mucosa and oropharynx reveal no lesions or exudates. Teeth in good repair. Neck: No deformities, masses, or tenderness noted. Range of motion minimally decreased. Thyroid absent. Lungs: Normal respiratory effort; chest expands symmetrically. Lungs are clear to auscultation without rales, wheezes, or increased work of breathing. Heart: Normal rate and rhythm. Split S1 ; normal S2. No gallop, click, or rub. No murmur. Abdomen: Bowel sounds normal; abdomen soft and nontender. No masses, organomegaly or hernias noted. Genitalia: As per Gyn                                  Musculoskeletal/extremities: Slightly accentuated curvature of upper thoracic   No clubbing, cyanosis, edema, or significant extremity  deformity noted. Range of motion normal .Tone & strength  Normal. Joints normal . Nail health good. Able to lie down & sit up w/o help. Negative SLR bilaterally Vascular: Carotid, radial artery, dorsalis pedis and  posterior tibial pulses are full and equal. No bruits present. Neurologic: Alert and oriented x3. Deep tendon reflexes symmetrical and normal.      Skin: Intact without suspicious lesions or rashes. Lymph: No cervical, axillary lymphadenopathy present. Psych: Mood and affect are normal. Normally interactive  Assessment & Plan:  #1 Medicare Wellness Exam; criteria met ; data entered #2 Problem List/Diagnoses reviewed #3 lipid & suppressive TSH goals verified Plan:  Assessments made/ Orders entered

## 2013-06-28 DIAGNOSIS — Z1231 Encounter for screening mammogram for malignant neoplasm of breast: Secondary | ICD-10-CM | POA: Diagnosis not present

## 2013-07-11 ENCOUNTER — Other Ambulatory Visit: Payer: Self-pay

## 2013-08-14 ENCOUNTER — Encounter: Payer: Self-pay | Admitting: Internal Medicine

## 2013-09-25 ENCOUNTER — Encounter: Payer: Self-pay | Admitting: Internal Medicine

## 2013-10-28 ENCOUNTER — Other Ambulatory Visit: Payer: Self-pay | Admitting: Internal Medicine

## 2013-10-28 DIAGNOSIS — F329 Major depressive disorder, single episode, unspecified: Secondary | ICD-10-CM

## 2013-10-28 DIAGNOSIS — F32A Depression, unspecified: Secondary | ICD-10-CM

## 2013-10-28 NOTE — Telephone Encounter (Signed)
Refill for celexa sent to [facility]

## 2013-11-11 ENCOUNTER — Ambulatory Visit: Payer: Medicare Other | Admitting: Family Medicine

## 2013-11-19 ENCOUNTER — Encounter: Payer: Self-pay | Admitting: Family Medicine

## 2013-11-19 ENCOUNTER — Ambulatory Visit (INDEPENDENT_AMBULATORY_CARE_PROVIDER_SITE_OTHER): Payer: Medicare Other | Admitting: Family Medicine

## 2013-11-19 VITALS — BP 126/70 | HR 74 | Temp 98.8°F | Ht 64.75 in | Wt 151.0 lb

## 2013-11-19 DIAGNOSIS — I10 Essential (primary) hypertension: Secondary | ICD-10-CM

## 2013-11-19 DIAGNOSIS — M353 Polymyalgia rheumatica: Secondary | ICD-10-CM

## 2013-11-19 DIAGNOSIS — E890 Postprocedural hypothyroidism: Secondary | ICD-10-CM

## 2013-11-19 DIAGNOSIS — Z85850 Personal history of malignant neoplasm of thyroid: Secondary | ICD-10-CM

## 2013-11-19 MED ORDER — EPINEPHRINE 0.3 MG/0.3ML IJ SOAJ: 0.3000 mg | Freq: Once | INTRAMUSCULAR | Status: DC

## 2013-11-19 NOTE — Progress Notes (Signed)
   Subjective:    Patient ID: Purcell Nails, female    DOB: 01-Feb-1935, 79 y.o.   MRN: [id number]  HPI 79 yr old female to establish after transferring from Dr. Linna Darner. She is doing well and has no complaints. She asks for a refill for EpiPens since she is highly allergic to a number of things. She had a cpx last August.    Review of Systems  Constitutional: Negative.   HENT: Negative.   Eyes: Negative.   Respiratory: Negative.   Cardiovascular: Negative.   Allergic/Immunologic: Positive for food allergies.       Objective:   Physical Exam  Constitutional: She appears well-developed and well-nourished.  Neck: No thyromegaly present.  Cardiovascular: Normal rate, regular rhythm, normal heart sounds and intact distal pulses.   Pulmonary/Chest: Effort normal and breath sounds normal.  Lymphadenopathy:    She has no cervical adenopathy.          Assessment & Plan:  Doing well. Send in [place].

## 2013-11-19 NOTE — Progress Notes (Signed)
Pre visit review using our clinic review tool, if applicable. No additional management support is needed unless otherwise documented below in the visit note. 

## 2013-12-09 ENCOUNTER — Other Ambulatory Visit: Payer: Self-pay | Admitting: Internal Medicine

## 2014-01-27 ENCOUNTER — Other Ambulatory Visit: Payer: Self-pay | Admitting: Internal Medicine

## 2014-02-04 ENCOUNTER — Telehealth: Payer: Self-pay | Admitting: Family Medicine

## 2014-02-04 NOTE — Telephone Encounter (Signed)
Pt request refill metoprolol (LOPRESSOR) 100 MG tablet [facility] Pt is out of med and needs asap if possible.

## 2014-02-05 MED ORDER — METOPROLOL TARTRATE 100 MG PO TABS: ORAL_TABLET | ORAL | Status: DC

## 2014-02-05 NOTE — Telephone Encounter (Signed)
[facility] on [address] is calling to check the status of pt re-fill. metoprolol (LOPRESSOR) 100 MG tablet pt is completely out.

## 2014-02-05 NOTE — Telephone Encounter (Signed)
I sent script e-scribe and spoke with pt. 

## 2014-02-05 NOTE — Telephone Encounter (Signed)
Pt called and talk to [facility] and would like a callback to notify her the RX is filled.

## 2014-02-13 ENCOUNTER — Telehealth: Payer: Self-pay | Admitting: Family Medicine

## 2014-02-13 NOTE — Telephone Encounter (Signed)
Patient needs a refill on LEVOTHYROXINE at the [facility] in [facility]

## 2014-02-14 MED ORDER — LEVOTHYROXINE SODIUM 100 MCG PO TABS: ORAL_TABLET | ORAL | Status: DC

## 2014-02-14 NOTE — Telephone Encounter (Signed)
I sent script e-scribe. 

## 2014-03-06 ENCOUNTER — Telehealth: Payer: Self-pay | Admitting: Family Medicine

## 2014-03-06 NOTE — Telephone Encounter (Signed)
Pt is needing new rx spironolactone (ALDACTONE) 25 MG tablet, [facility] to [facility] [place].

## 2014-03-10 MED ORDER — SPIRONOLACTONE 25 MG PO TABS
25.0000 mg | ORAL_TABLET | Freq: Every day | ORAL | Status: DC
Start: 2014-03-10 — End: 2014-04-24

## 2014-03-10 NOTE — Telephone Encounter (Signed)
I sent script e-scribe and left a voice message for pt. 

## 2014-03-10 NOTE — Telephone Encounter (Signed)
Pt is calling back to check on the rx, pt states she only has 1 more pill for tomorrow.

## 2014-04-24 ENCOUNTER — Encounter: Payer: Self-pay | Admitting: Family Medicine

## 2014-04-24 ENCOUNTER — Ambulatory Visit (INDEPENDENT_AMBULATORY_CARE_PROVIDER_SITE_OTHER): Payer: Medicare Other | Admitting: Family Medicine

## 2014-04-24 ENCOUNTER — Telehealth: Payer: Self-pay | Admitting: Family Medicine

## 2014-04-24 VITALS — BP 128/78 | HR 61 | Temp 98.2°F | Ht 65.0 in | Wt 149.0 lb

## 2014-04-24 DIAGNOSIS — E782 Mixed hyperlipidemia: Secondary | ICD-10-CM

## 2014-04-24 DIAGNOSIS — Z85850 Personal history of malignant neoplasm of thyroid: Secondary | ICD-10-CM | POA: Diagnosis not present

## 2014-04-24 DIAGNOSIS — F329 Major depressive disorder, single episode, unspecified: Secondary | ICD-10-CM

## 2014-04-24 DIAGNOSIS — K589 Irritable bowel syndrome without diarrhea: Secondary | ICD-10-CM

## 2014-04-24 DIAGNOSIS — E890 Postprocedural hypothyroidism: Secondary | ICD-10-CM

## 2014-04-24 DIAGNOSIS — F3289 Other specified depressive episodes: Secondary | ICD-10-CM | POA: Diagnosis not present

## 2014-04-24 DIAGNOSIS — I10 Essential (primary) hypertension: Secondary | ICD-10-CM | POA: Diagnosis not present

## 2014-04-24 DIAGNOSIS — F32A Depression, unspecified: Secondary | ICD-10-CM

## 2014-04-24 DIAGNOSIS — Z23 Encounter for immunization: Secondary | ICD-10-CM

## 2014-04-24 LAB — HEPATIC FUNCTION PANEL
ALBUMIN: 4.2 g/dL (ref 3.5–5.2)
ALT: 45 U/L — ABNORMAL HIGH (ref 0–35)
AST: 40 U/L — ABNORMAL HIGH (ref 0–37)
Alkaline Phosphatase: 84 U/L (ref 39–117)
BILIRUBIN TOTAL: 0.7 mg/dL (ref 0.2–1.2)
Bilirubin, Direct: 0 mg/dL (ref 0.0–0.3)
Total Protein: 6.9 g/dL (ref 6.0–8.3)

## 2014-04-24 LAB — CBC WITH DIFFERENTIAL/PLATELET
BASOS ABS: 0 10*3/uL (ref 0.0–0.1)
Basophils Relative: 0.4 % (ref 0.0–3.0)
EOS ABS: 0.2 10*3/uL (ref 0.0–0.7)
Eosinophils Relative: 2.8 % (ref 0.0–5.0)
HCT: 37.5 % (ref 36.0–46.0)
HEMOGLOBIN: 13 g/dL (ref 12.0–15.0)
LYMPHS PCT: 35.7 % (ref 12.0–46.0)
Lymphs Abs: 2 10*3/uL (ref 0.7–4.0)
MCHC: 34.7 g/dL (ref 30.0–36.0)
MCV: 91 fl (ref 78.0–100.0)
Monocytes Absolute: 0.5 10*3/uL (ref 0.1–1.0)
Monocytes Relative: 9.1 % (ref 3.0–12.0)
NEUTROS ABS: 3 10*3/uL (ref 1.4–7.7)
NEUTROS PCT: 52 % (ref 43.0–77.0)
Platelets: 279 10*3/uL (ref 150.0–400.0)
RBC: 4.11 Mil/uL (ref 3.87–5.11)
RDW: 11.7 % (ref 11.5–15.5)
WBC: 5.7 10*3/uL (ref 4.0–10.5)

## 2014-04-24 LAB — BASIC METABOLIC PANEL
BUN: 10 mg/dL (ref 6–23)
CO2: 29 mEq/L (ref 19–32)
CREATININE: 0.5 mg/dL (ref 0.4–1.2)
Calcium: 9.4 mg/dL (ref 8.4–10.5)
Chloride: 95 mEq/L — ABNORMAL LOW (ref 96–112)
GFR: 121.63 mL/min (ref >60.00)
Glucose, Bld: 86 mg/dL (ref 70–99)
Potassium: 4.3 mEq/L (ref 3.5–5.1)
Sodium: 132 mEq/L — ABNORMAL LOW (ref 135–145)

## 2014-04-24 LAB — LIPID PANEL
CHOL/HDL RATIO: 3
Cholesterol: 146 mg/dL (ref 0–200)
HDL: 43.9 mg/dL (ref >39.00)
LDL Cholesterol: 80 mg/dL (ref 0–99)
NonHDL: 102.1
Triglycerides: 113 mg/dL (ref 0.0–149.0)
VLDL: 22.6 mg/dL (ref 0.0–40.0)

## 2014-04-24 LAB — POCT URINALYSIS DIPSTICK
BILIRUBIN UA: NEGATIVE
Blood, UA: NEGATIVE
Glucose, UA: NEGATIVE
KETONES UA: NEGATIVE
Nitrite, UA: NEGATIVE
PROTEIN UA: NEGATIVE
Spec Grav, UA: 1.01
Urobilinogen, UA: 0.2
pH, UA: 8.5

## 2014-04-24 LAB — T4, FREE: FREE T4: 1.61 ng/dL — AB (ref 0.60–1.60)

## 2014-04-24 LAB — TSH: TSH: 0.01 u[IU]/mL — AB (ref 0.35–4.50)

## 2014-04-24 LAB — T3, FREE: T3, Free: 2.9 pg/mL (ref 2.3–4.2)

## 2014-04-24 MED ORDER — SPIRONOLACTONE 25 MG PO TABS
25.0000 mg | ORAL_TABLET | Freq: Every day | ORAL | Status: DC
Start: 2014-04-24 — End: 2015-06-08

## 2014-04-24 MED ORDER — CITALOPRAM HYDROBROMIDE 20 MG PO TABS: ORAL_TABLET | ORAL | Status: DC

## 2014-04-24 MED ORDER — PRAVASTATIN SODIUM 40 MG PO TABS: ORAL_TABLET | ORAL | Status: DC

## 2014-04-24 MED ORDER — LEVOTHYROXINE SODIUM 112 MCG PO TABS: 112.0000 ug | ORAL_TABLET | Freq: Every day | ORAL | Status: DC

## 2014-04-24 MED ORDER — METOPROLOL TARTRATE 100 MG PO TABS: ORAL_TABLET | ORAL | Status: DC

## 2014-04-24 NOTE — Progress Notes (Signed)
   Subjective:    Patient ID: [name] [name], female    DOB: 1935/06/11, 79 y.o.   MRN: [id number]  HPI 79 yr old female for a cpx. She feels well in general. She has been on 100 mcg a day of Synthroid for some years but she thinks this dose should be increased. She has had some mild fatigue and some weight gain since her dose was decreased.    Review of Systems  Constitutional: Negative.   HENT: Negative.   Eyes: Negative.   Respiratory: Negative.   Cardiovascular: Negative.   Gastrointestinal: Negative.   Genitourinary: Negative for dysuria, urgency, frequency, hematuria, flank pain, decreased urine volume, enuresis, difficulty urinating, pelvic pain and dyspareunia.  Musculoskeletal: Negative.   Skin: Negative.   Neurological: Negative.   Psychiatric/Behavioral: Negative.        Objective:   Physical Exam  Constitutional: She is oriented to person, place, and time. She appears well-developed and well-nourished. No distress.  HENT:  Head: Normocephalic and atraumatic.  Right Ear: External ear normal.  Left Ear: External ear normal.  Nose: Nose normal.  Mouth/Throat: Oropharynx is clear and moist. No oropharyngeal exudate.  Eyes: Conjunctivae and EOM are normal. Pupils are equal, round, and reactive to light. No scleral icterus.  Neck: Normal range of motion. Neck supple. No JVD present. No thyromegaly present.  Cardiovascular: Normal rate, regular rhythm, normal heart sounds and intact distal pulses.  Exam reveals no gallop and no friction rub.   No murmur heard. Pulmonary/Chest: Effort normal and breath sounds normal. No respiratory distress. She has no wheezes. She has no rales. She exhibits no tenderness.  Abdominal: Soft. Bowel sounds are normal. She exhibits no distension and no mass. There is no tenderness. There is no rebound and no guarding.  Musculoskeletal: Normal range of motion. She exhibits no edema and no tenderness.  Lymphadenopathy:    She has no cervical  adenopathy.  Neurological: She is alert and oriented to person, place, and time. She has normal reflexes. No cranial nerve deficit. She exhibits normal muscle tone. Coordination normal.  Skin: Skin is warm and dry. No rash noted. No erythema.  Psychiatric: She has a normal mood and affect. Her behavior is normal. Judgment and thought content normal.          Assessment & Plan:  Well exam. Get fasting labs. Get a full thyroid panel. We will increase the Synthroid to 112 mcg daily and recheck in 90 days.

## 2014-04-24 NOTE — Progress Notes (Signed)
Pre visit review using our clinic review tool, if applicable. No additional management support is needed unless otherwise documented below in the visit note. 

## 2014-04-24 NOTE — Telephone Encounter (Signed)
Relevant patient education assigned to patient using Emmi. ° °

## 2014-04-25 ENCOUNTER — Telehealth: Payer: Self-pay | Admitting: Family Medicine

## 2014-04-25 NOTE — Telephone Encounter (Signed)
Pt came by to say the other medicine that she is taking is Trimethoprim 100 mg.  Pt also would like a couple of her labs sent to her by mail

## 2014-04-28 NOTE — Telephone Encounter (Signed)
I updated pt's medication list, also put a copy of lab results in mail to pt.

## 2014-05-01 ENCOUNTER — Telehealth: Payer: Self-pay | Admitting: Family Medicine

## 2014-05-01 NOTE — Telephone Encounter (Signed)
Pt [name] [name]  returning a call about her labs and would like a call back.

## 2014-05-02 NOTE — Telephone Encounter (Signed)
I left a voice message with results and put a copy in the mail to pt.

## 2014-06-18 ENCOUNTER — Other Ambulatory Visit: Payer: Self-pay | Admitting: Internal Medicine

## 2014-06-20 ENCOUNTER — Other Ambulatory Visit: Payer: Self-pay | Admitting: Internal Medicine

## 2014-06-24 ENCOUNTER — Other Ambulatory Visit: Payer: Self-pay | Admitting: Internal Medicine

## 2014-07-04 DIAGNOSIS — Z1231 Encounter for screening mammogram for malignant neoplasm of breast: Secondary | ICD-10-CM | POA: Diagnosis not present

## 2014-08-05 ENCOUNTER — Ambulatory Visit (INDEPENDENT_AMBULATORY_CARE_PROVIDER_SITE_OTHER): Payer: Medicare Other | Admitting: Family Medicine

## 2014-08-05 ENCOUNTER — Encounter: Payer: Self-pay | Admitting: Family Medicine

## 2014-08-05 VITALS — BP 139/72 | HR 60 | Temp 98.7°F | Ht 65.0 in | Wt 150.0 lb

## 2014-08-05 DIAGNOSIS — E890 Postprocedural hypothyroidism: Secondary | ICD-10-CM | POA: Diagnosis not present

## 2014-08-05 DIAGNOSIS — I10 Essential (primary) hypertension: Secondary | ICD-10-CM

## 2014-08-05 LAB — T4, FREE: Free T4: 1.5 ng/dL (ref 0.60–1.60)

## 2014-08-05 LAB — T3, FREE: T3, Free: 3 pg/mL (ref 2.3–4.2)

## 2014-08-05 LAB — TSH: TSH: 0.04 u[IU]/mL — ABNORMAL LOW (ref 0.35–4.50)

## 2014-08-05 NOTE — Progress Notes (Signed)
Pre visit review using our clinic review tool, if applicable. No additional management support is needed unless otherwise documented below in the visit note. 

## 2014-08-05 NOTE — Progress Notes (Signed)
   Subjective:    Patient ID: [name] [name], female    DOB: May 31, 1935, 79 y.o.   MRN: [id number]  HPI Here to recheck her thyroid status. 3 months ago we increased her Synthroid to 112 mcg daily. She feels better with more energy, although she experienced a little thinning of the hair. Her weight and BP are stable.    Review of Systems  Constitutional: Negative.   Respiratory: Negative.   Cardiovascular: Negative.        Objective:   Physical Exam  Constitutional: She appears well-developed and well-nourished.  Neck: No thyromegaly present.  Cardiovascular: Normal rate, regular rhythm, normal heart sounds and intact distal pulses.   Pulmonary/Chest: Effort normal and breath sounds normal.  Lymphadenopathy:    She has no cervical adenopathy.          Assessment & Plan:  Get another thyroid panel today.

## 2014-08-08 ENCOUNTER — Encounter: Payer: Self-pay | Admitting: Family Medicine

## 2014-08-08 ENCOUNTER — Ambulatory Visit (INDEPENDENT_AMBULATORY_CARE_PROVIDER_SITE_OTHER): Payer: Medicare Other | Admitting: Family Medicine

## 2014-08-08 VITALS — BP 120/84 | Temp 98.5°F

## 2014-08-08 DIAGNOSIS — E890 Postprocedural hypothyroidism: Secondary | ICD-10-CM | POA: Diagnosis not present

## 2014-08-08 NOTE — Progress Notes (Signed)
   Subjective:    Patient ID: [name] [name], female    DOB: Apr 14, 1935, 79 y.o.   MRN: [id number]  HPI Here to go over her recent thyroid labs. Her T3 and T4 are in range and the TSH is slightly depressed. She feels great at her current dosage.   Review of Systems  Constitutional: Negative.   Respiratory: Negative.   Cardiovascular: Negative.   Endocrine: Negative.        Objective:   Physical Exam  Constitutional: She appears well-developed and well-nourished.  Neck: No thyromegaly present.  Cardiovascular: Normal rate, regular rhythm, normal heart sounds and intact distal pulses.   Pulmonary/Chest: Effort normal and breath sounds normal.  Lymphadenopathy:    She has no cervical adenopathy.          Assessment & Plan:  We will keep her Synthroid dose at 112 mcg and recheck in 6 months

## 2015-01-13 ENCOUNTER — Other Ambulatory Visit (INDEPENDENT_AMBULATORY_CARE_PROVIDER_SITE_OTHER): Payer: Medicare Other

## 2015-01-13 ENCOUNTER — Other Ambulatory Visit: Payer: Self-pay | Admitting: Family Medicine

## 2015-01-13 DIAGNOSIS — E890 Postprocedural hypothyroidism: Secondary | ICD-10-CM | POA: Diagnosis not present

## 2015-01-13 LAB — TSH: TSH: 0.03 u[IU]/mL — AB (ref 0.35–4.50)

## 2015-01-19 ENCOUNTER — Encounter: Payer: Self-pay | Admitting: Family Medicine

## 2015-01-21 NOTE — Telephone Encounter (Signed)
No I felt a screening TSH was sufficient. We can get a free T3 and free T4 if she wishes

## 2015-04-13 ENCOUNTER — Other Ambulatory Visit: Payer: Self-pay | Admitting: Family Medicine

## 2015-05-13 ENCOUNTER — Other Ambulatory Visit: Payer: Self-pay | Admitting: Family Medicine

## 2015-06-03 ENCOUNTER — Other Ambulatory Visit: Payer: PRIVATE HEALTH INSURANCE

## 2015-06-04 ENCOUNTER — Other Ambulatory Visit (INDEPENDENT_AMBULATORY_CARE_PROVIDER_SITE_OTHER): Payer: Medicare Other

## 2015-06-04 DIAGNOSIS — I10 Essential (primary) hypertension: Secondary | ICD-10-CM

## 2015-06-04 DIAGNOSIS — E890 Postprocedural hypothyroidism: Secondary | ICD-10-CM | POA: Diagnosis not present

## 2015-06-04 DIAGNOSIS — E785 Hyperlipidemia, unspecified: Secondary | ICD-10-CM

## 2015-06-04 DIAGNOSIS — Z0000 Encounter for general adult medical examination without abnormal findings: Secondary | ICD-10-CM | POA: Diagnosis not present

## 2015-06-04 LAB — CBC WITH DIFFERENTIAL/PLATELET
BASOS ABS: 0 10*3/uL (ref 0.0–0.1)
Basophils Relative: 0.5 % (ref 0.0–3.0)
Eosinophils Absolute: 0.2 10*3/uL (ref 0.0–0.7)
Eosinophils Relative: 4.1 % (ref 0.0–5.0)
HCT: 38.2 % (ref 36.0–46.0)
Hemoglobin: 13 g/dL (ref 12.0–15.0)
LYMPHS ABS: 1.9 10*3/uL (ref 0.7–4.0)
Lymphocytes Relative: 35.2 % (ref 12.0–46.0)
MCHC: 34.1 g/dL (ref 30.0–36.0)
MCV: 92.6 fl (ref 78.0–100.0)
MONO ABS: 0.5 10*3/uL (ref 0.1–1.0)
MONOS PCT: 9.1 % (ref 3.0–12.0)
NEUTROS PCT: 51.1 % (ref 43.0–77.0)
Neutro Abs: 2.8 10*3/uL (ref 1.4–7.7)
Platelets: 244 10*3/uL (ref 150.0–400.0)
RBC: 4.12 Mil/uL (ref 3.87–5.11)
RDW: 12.3 % (ref 11.5–15.5)
WBC: 5.4 10*3/uL (ref 4.0–10.5)

## 2015-06-04 LAB — BASIC METABOLIC PANEL
BUN: 10 mg/dL (ref 6–23)
CALCIUM: 9.3 mg/dL (ref 8.4–10.5)
CO2: 29 mEq/L (ref 19–32)
CREATININE: 0.6 mg/dL (ref 0.40–1.20)
Chloride: 97 mEq/L (ref 96–112)
GFR: 102.81 mL/min (ref >60.00)
GLUCOSE: 98 mg/dL (ref 70–99)
Potassium: 4.4 mEq/L (ref 3.5–5.1)
SODIUM: 133 meq/L — AB (ref 135–145)

## 2015-06-04 LAB — LIPID PANEL
CHOL/HDL RATIO: 3
Cholesterol: 151 mg/dL (ref 0–200)
HDL: 44.2 mg/dL (ref >39.00)
LDL CALC: 84 mg/dL (ref 0–99)
NONHDL: 107.05
Triglycerides: 113 mg/dL (ref 0.0–149.0)
VLDL: 22.6 mg/dL (ref 0.0–40.0)

## 2015-06-04 LAB — HEPATIC FUNCTION PANEL
ALK PHOS: 53 U/L (ref 39–117)
ALT: 9 U/L (ref 0–35)
AST: 18 U/L (ref 0–37)
Albumin: 4.1 g/dL (ref 3.5–5.2)
BILIRUBIN DIRECT: 0.1 mg/dL (ref 0.0–0.3)
BILIRUBIN TOTAL: 0.7 mg/dL (ref 0.2–1.2)
Total Protein: 6.5 g/dL (ref 6.0–8.3)

## 2015-06-04 LAB — TSH: TSH: 0.52 u[IU]/mL (ref 0.35–4.50)

## 2015-06-05 LAB — POCT URINALYSIS DIPSTICK
Bilirubin, UA: NEGATIVE
Blood, UA: NEGATIVE
Glucose, UA: NEGATIVE
Ketones, UA: NEGATIVE
NITRITE UA: NEGATIVE
PH UA: 6.5
PROTEIN UA: NEGATIVE
Spec Grav, UA: 1.01
UROBILINOGEN UA: 0.2

## 2015-06-08 ENCOUNTER — Ambulatory Visit (INDEPENDENT_AMBULATORY_CARE_PROVIDER_SITE_OTHER): Payer: Medicare Other | Admitting: Family Medicine

## 2015-06-08 ENCOUNTER — Encounter: Payer: Self-pay | Admitting: Family Medicine

## 2015-06-08 VITALS — BP 148/79 | HR 59 | Temp 98.1°F | Ht 65.0 in | Wt 155.0 lb

## 2015-06-08 DIAGNOSIS — F32A Depression, unspecified: Secondary | ICD-10-CM

## 2015-06-08 DIAGNOSIS — F329 Major depressive disorder, single episode, unspecified: Secondary | ICD-10-CM

## 2015-06-08 DIAGNOSIS — Z0000 Encounter for general adult medical examination without abnormal findings: Secondary | ICD-10-CM | POA: Diagnosis not present

## 2015-06-08 DIAGNOSIS — I10 Essential (primary) hypertension: Secondary | ICD-10-CM | POA: Diagnosis not present

## 2015-06-08 MED ORDER — SPIRONOLACTONE 25 MG PO TABS: 25.0000 mg | ORAL_TABLET | Freq: Every day | ORAL | Status: DC

## 2015-06-08 MED ORDER — PRAVASTATIN SODIUM 40 MG PO TABS: ORAL_TABLET | ORAL | Status: DC

## 2015-06-08 MED ORDER — LEVOTHYROXINE SODIUM 112 MCG PO TABS: 112.0000 ug | ORAL_TABLET | Freq: Every day | ORAL | Status: DC

## 2015-06-08 MED ORDER — METOPROLOL TARTRATE 100 MG PO TABS: ORAL_TABLET | ORAL | Status: DC

## 2015-06-08 MED ORDER — EPINEPHRINE 0.3 MG/0.3ML IJ SOAJ: 0.3000 mg | Freq: Once | INTRAMUSCULAR | Status: DC

## 2015-06-08 MED ORDER — CITALOPRAM HYDROBROMIDE 20 MG PO TABS: ORAL_TABLET | ORAL | Status: DC

## 2015-06-08 NOTE — Progress Notes (Signed)
Pre visit review using our clinic review tool, if applicable. No additional management support is needed unless otherwise documented below in the visit note. 

## 2015-06-08 NOTE — Progress Notes (Signed)
   Subjective:    Patient ID: [name] [name], female    DOB: 10-25-1934, 80 y.o.   MRN: [id number]  HPI 80 yr old female for a well exam. She feels fine in general. She gets exercise and watches her diet. Her BP at home is stable in the 120s over 70s.    Review of Systems  Constitutional: Negative.   HENT: Negative.   Eyes: Negative.   Respiratory: Negative.   Cardiovascular: Negative.   Gastrointestinal: Negative.   Genitourinary: Negative for dysuria, urgency, frequency, hematuria, flank pain, decreased urine volume, enuresis, difficulty urinating, pelvic pain and dyspareunia.  Musculoskeletal: Negative.   Skin: Negative.   Neurological: Negative.   Psychiatric/Behavioral: Negative.        Objective:   Physical Exam  Constitutional: She is oriented to person, place, and time. She appears well-developed and well-nourished. No distress.  HENT:  Head: Normocephalic and atraumatic.  Right Ear: External ear normal.  Left Ear: External ear normal.  Nose: Nose normal.  Mouth/Throat: Oropharynx is clear and moist. No oropharyngeal exudate.  Eyes: Conjunctivae and EOM are normal. Pupils are equal, round, and reactive to light. No scleral icterus.  Neck: Normal range of motion. Neck supple. No JVD present. No thyromegaly present.  Cardiovascular: Normal rate, regular rhythm, normal heart sounds and intact distal pulses.  Exam reveals no gallop and no friction rub.   No murmur heard. EKG normal  Pulmonary/Chest: Effort normal and breath sounds normal. No respiratory distress. She has no wheezes. She has no rales. She exhibits no tenderness.  Abdominal: Soft. Bowel sounds are normal. She exhibits no distension and no mass. There is no tenderness. There is no rebound and no guarding.  Musculoskeletal: Normal range of motion. She exhibits no edema or tenderness.  Lymphadenopathy:    She has no cervical adenopathy.  Neurological: She is alert and oriented to person, place, and time.  She has normal reflexes. No cranial nerve deficit. She exhibits normal muscle tone. Coordination normal.  Skin: Skin is warm and dry. No rash noted. No erythema.  Psychiatric: She has a normal mood and affect. Her behavior is normal. Judgment and thought content normal.          Assessment & Plan:  Well exam. We discussed diet and exercise advice. Set up another colonoscopy.

## 2015-06-09 ENCOUNTER — Telehealth: Payer: Self-pay | Admitting: Family Medicine

## 2015-06-09 ENCOUNTER — Other Ambulatory Visit: Payer: Self-pay | Admitting: Family Medicine

## 2015-06-09 DIAGNOSIS — E039 Hypothyroidism, unspecified: Secondary | ICD-10-CM

## 2015-06-09 NOTE — Telephone Encounter (Signed)
Per Dr. Sarajane Jews order a TSH, due in 6 months and order was put in computer.

## 2015-06-09 NOTE — Telephone Encounter (Signed)
Left message on voice mail  to call back

## 2015-06-09 NOTE — Telephone Encounter (Signed)
Can you call pt to see if she wants to schedule lab appointment? We ordered a thyroid level.

## 2015-08-02 ENCOUNTER — Other Ambulatory Visit: Payer: Self-pay | Admitting: Family Medicine

## 2015-09-29 NOTE — Progress Notes (Signed)
   Subjective:    Patient ID: [name] [name], female    DOB: [birth year], 81 y.o.   MRN: [id number]  HPI Also noted is her depression is under control, her moods are stable, and she is sleeping well. As for the hyperlipidemia she watches her diet closely. Her thyroid level seems to be stable as her energy level is good and her weight is stable.    Review of Systems     Objective:   Physical Exam        Assessment & Plan:  Her HTN is well controlled. Her depression is stable. For the hyperlipidemia we will get a fasting lipid level. We will check her hypothyroidism with a TSH.

## 2015-11-07 ENCOUNTER — Other Ambulatory Visit: Payer: Self-pay | Admitting: Family Medicine

## 2015-11-30 ENCOUNTER — Other Ambulatory Visit (INDEPENDENT_AMBULATORY_CARE_PROVIDER_SITE_OTHER): Payer: Medicare Other

## 2015-11-30 DIAGNOSIS — E039 Hypothyroidism, unspecified: Secondary | ICD-10-CM

## 2015-11-30 LAB — TSH: TSH: 0.01 u[IU]/mL — ABNORMAL LOW (ref 0.35–4.50)

## 2015-12-03 ENCOUNTER — Telehealth: Payer: Self-pay | Admitting: Family Medicine

## 2015-12-03 NOTE — Telephone Encounter (Signed)
Pt want a copy of her lab and want a call from you so you can tell her why she have to come back for a 6 mo appt.

## 2015-12-04 ENCOUNTER — Other Ambulatory Visit: Payer: Self-pay | Admitting: Family Medicine

## 2015-12-04 DIAGNOSIS — E039 Hypothyroidism, unspecified: Secondary | ICD-10-CM

## 2015-12-04 MED ORDER — LEVOTHYROXINE SODIUM 100 MCG PO TABS: 100.0000 ug | ORAL_TABLET | Freq: Every day | ORAL | Status: DC

## 2015-12-04 NOTE — Telephone Encounter (Signed)
I spoke with pt and went over results. 

## 2015-12-07 ENCOUNTER — Ambulatory Visit: Payer: Medicare Other | Admitting: Family Medicine

## 2015-12-22 ENCOUNTER — Ambulatory Visit: Payer: Medicare Other | Admitting: Family Medicine

## 2016-01-21 ENCOUNTER — Telehealth: Payer: Self-pay | Admitting: Family Medicine

## 2016-01-21 NOTE — Telephone Encounter (Signed)
This patient is a long time paient of Dr. Olevia Perches and Dr. Sarajane Jews said that she had retired. He said that he would give her a referral to another gastro doctor at [facility]. She would like to know the name and she does not do my chart. So she doesn't know who he is referring her to and she can't make an appointment until she knows. You can call and leave a message on her answering machine with the name of the gastro doctor.

## 2016-01-22 NOTE — Telephone Encounter (Signed)
Can you try to reach pt and give the below message? I tried to reach pt by phone, just the machine came on.

## 2016-01-22 NOTE — Telephone Encounter (Signed)
The GI office is apparently waiting for the patient to call them to make an appt. Tell her to cal the GI office and ask for either Dr. Havery Moros or Dr. Ardis Hughs

## 2016-01-22 NOTE — Telephone Encounter (Signed)
Pt is aware.  

## 2016-02-05 ENCOUNTER — Other Ambulatory Visit: Payer: Self-pay | Admitting: Family Medicine

## 2016-03-11 ENCOUNTER — Other Ambulatory Visit (INDEPENDENT_AMBULATORY_CARE_PROVIDER_SITE_OTHER): Payer: Medicare Other

## 2016-03-11 DIAGNOSIS — E039 Hypothyroidism, unspecified: Secondary | ICD-10-CM

## 2016-03-11 LAB — TSH: TSH: 1.07 u[IU]/mL (ref 0.35–4.50)

## 2016-03-15 ENCOUNTER — Telehealth: Payer: Self-pay | Admitting: Family Medicine

## 2016-03-15 NOTE — Telephone Encounter (Signed)
Left message on patients machine to call us back. Per Dr. Regis Bill we can inform patient of TSH results and that it is within normal range but still need Dr. Sarajane Jews to evaluate and give plan for the future.

## 2016-03-15 NOTE — Telephone Encounter (Signed)
Patient states she wants a call about her lab work results from 03/11/16 and also wants a paper copy sent to her.

## 2016-03-16 NOTE — Telephone Encounter (Signed)
I spoke with pt and gave results. I will print a copy and put in the mail once Dr. Sarajane Jews has reviewed them.

## 2016-06-14 ENCOUNTER — Other Ambulatory Visit: Payer: Self-pay | Admitting: Family Medicine

## 2016-06-28 ENCOUNTER — Other Ambulatory Visit: Payer: Self-pay | Admitting: Family Medicine

## 2016-06-28 DIAGNOSIS — F329 Major depressive disorder, single episode, unspecified: Secondary | ICD-10-CM

## 2016-06-28 DIAGNOSIS — F32A Depression, unspecified: Secondary | ICD-10-CM

## 2016-07-04 ENCOUNTER — Other Ambulatory Visit: Payer: Self-pay | Admitting: Family Medicine

## 2016-07-27 ENCOUNTER — Other Ambulatory Visit: Payer: Self-pay | Admitting: Family Medicine

## 2016-09-14 ENCOUNTER — Other Ambulatory Visit: Payer: Self-pay | Admitting: Family Medicine

## 2016-09-14 NOTE — Telephone Encounter (Signed)
Patient last seen for CPE 06/2015 - ok to refill?

## 2016-10-03 ENCOUNTER — Other Ambulatory Visit: Payer: Self-pay | Admitting: Family Medicine

## 2016-10-25 ENCOUNTER — Other Ambulatory Visit: Payer: Self-pay | Admitting: Family Medicine

## 2016-10-27 ENCOUNTER — Other Ambulatory Visit: Payer: Self-pay | Admitting: Family Medicine

## 2016-12-01 ENCOUNTER — Other Ambulatory Visit: Payer: Self-pay | Admitting: Family Medicine

## 2016-12-09 ENCOUNTER — Telehealth: Payer: Self-pay | Admitting: Family Medicine

## 2017-01-03 ENCOUNTER — Other Ambulatory Visit: Payer: Self-pay | Admitting: Family Medicine

## 2017-01-17 NOTE — Telephone Encounter (Signed)
Called to see if pt wanted to schedule awv - left message.  

## 2017-03-07 ENCOUNTER — Other Ambulatory Visit: Payer: Self-pay | Admitting: Family Medicine

## 2017-03-07 MED ORDER — LEVOTHYROXINE SODIUM 100 MCG PO TABS: ORAL_TABLET | ORAL | 0 refills | Status: DC

## 2017-03-07 NOTE — Addendum Note (Signed)
Addended by: Kateri Mc E on: 03/07/2017 04:52 PM   Modules accepted: Orders

## 2017-03-07 NOTE — Telephone Encounter (Signed)
Pt need refill on Spironolactone-25mg /Pravastatin-40mg  and Levothyroxine -100mg . Pt stated she is out of Levothyroxine

## 2017-03-07 NOTE — Telephone Encounter (Signed)
Rx for Levothyroxine sent in. Other medications will be filled based off office visit.

## 2017-03-07 NOTE — Telephone Encounter (Signed)
Pt needs to schedule a office visit and might would advise to fast, we need labs.

## 2017-03-07 NOTE — Telephone Encounter (Signed)
Pt has been scheduled.  °

## 2017-03-07 NOTE — Telephone Encounter (Signed)
Lmom for pt to call the office °

## 2017-03-13 ENCOUNTER — Ambulatory Visit: Payer: Medicare Other

## 2017-03-14 ENCOUNTER — Ambulatory Visit (INDEPENDENT_AMBULATORY_CARE_PROVIDER_SITE_OTHER): Payer: Medicare Other | Admitting: Family Medicine

## 2017-03-14 ENCOUNTER — Encounter: Payer: Self-pay | Admitting: Family Medicine

## 2017-03-14 VITALS — BP 150/78 | HR 57 | Temp 98.2°F | Ht 65.0 in | Wt 154.0 lb

## 2017-03-14 DIAGNOSIS — Z9109 Other allergy status, other than to drugs and biological substances: Secondary | ICD-10-CM | POA: Diagnosis not present

## 2017-03-14 DIAGNOSIS — E782 Mixed hyperlipidemia: Secondary | ICD-10-CM

## 2017-03-14 DIAGNOSIS — M353 Polymyalgia rheumatica: Secondary | ICD-10-CM

## 2017-03-14 DIAGNOSIS — I10 Essential (primary) hypertension: Secondary | ICD-10-CM | POA: Diagnosis not present

## 2017-03-14 DIAGNOSIS — E890 Postprocedural hypothyroidism: Secondary | ICD-10-CM | POA: Diagnosis not present

## 2017-03-14 LAB — CBC WITH DIFFERENTIAL/PLATELET
BASOS PCT: 0.5 % (ref 0.0–3.0)
Basophils Absolute: 0 10*3/uL (ref 0.0–0.1)
EOS PCT: 2.9 % (ref 0.0–5.0)
Eosinophils Absolute: 0.2 10*3/uL (ref 0.0–0.7)
HEMATOCRIT: 37.7 % (ref 36.0–46.0)
HEMOGLOBIN: 13.2 g/dL (ref 12.0–15.0)
LYMPHS PCT: 36.9 % (ref 12.0–46.0)
Lymphs Abs: 2.1 10*3/uL (ref 0.7–4.0)
MCHC: 35.1 g/dL (ref 30.0–36.0)
MCV: 89.4 fl (ref 78.0–100.0)
Monocytes Absolute: 0.5 10*3/uL (ref 0.1–1.0)
Monocytes Relative: 8.9 % (ref 3.0–12.0)
NEUTROS ABS: 2.9 10*3/uL (ref 1.4–7.7)
Neutrophils Relative %: 50.8 % (ref 43.0–77.0)
Platelets: 239 10*3/uL (ref 150.0–400.0)
RBC: 4.22 Mil/uL (ref 3.87–5.11)
RDW: 12 % (ref 11.5–15.5)
WBC: 5.8 10*3/uL (ref 4.0–10.5)

## 2017-03-14 LAB — BASIC METABOLIC PANEL
BUN: 12 mg/dL (ref 6–23)
CALCIUM: 9.3 mg/dL (ref 8.4–10.5)
CHLORIDE: 100 meq/L (ref 96–112)
CO2: 30 mEq/L (ref 19–32)
CREATININE: 0.58 mg/dL (ref 0.40–1.20)
GFR: 106.43 mL/min (ref >60.00)
Glucose, Bld: 104 mg/dL — ABNORMAL HIGH (ref 70–99)
Potassium: 4.1 mEq/L (ref 3.5–5.1)
Sodium: 136 mEq/L (ref 135–145)

## 2017-03-14 LAB — T3, FREE: T3, Free: 3.1 pg/mL (ref 2.3–4.2)

## 2017-03-14 LAB — HEPATIC FUNCTION PANEL
ALK PHOS: 53 U/L (ref 39–117)
ALT: 9 U/L (ref 0–35)
AST: 18 U/L (ref 0–37)
Albumin: 4.1 g/dL (ref 3.5–5.2)
BILIRUBIN DIRECT: 0.1 mg/dL (ref 0.0–0.3)
Total Bilirubin: 0.5 mg/dL (ref 0.2–1.2)
Total Protein: 6.4 g/dL (ref 6.0–8.3)

## 2017-03-14 LAB — LIPID PANEL
CHOL/HDL RATIO: 4
Cholesterol: 156 mg/dL (ref 0–200)
HDL: 41 mg/dL (ref >39.00)
LDL Cholesterol: 88 mg/dL (ref 0–99)
NonHDL: 115.08
TRIGLYCERIDES: 133 mg/dL (ref 0.0–149.0)
VLDL: 26.6 mg/dL (ref 0.0–40.0)

## 2017-03-14 LAB — T4, FREE: Free T4: 1.14 ng/dL (ref 0.60–1.60)

## 2017-03-14 LAB — TSH: TSH: 0.33 u[IU]/mL — ABNORMAL LOW (ref 0.35–4.50)

## 2017-03-14 MED ORDER — SPIRONOLACTONE 25 MG PO TABS: ORAL_TABLET | ORAL | 3 refills | Status: DC

## 2017-03-14 MED ORDER — PRAVASTATIN SODIUM 40 MG PO TABS: ORAL_TABLET | ORAL | 3 refills | Status: DC

## 2017-03-14 MED ORDER — METOPROLOL TARTRATE 100 MG PO TABS: ORAL_TABLET | ORAL | 3 refills | Status: DC

## 2017-03-14 MED ORDER — EPINEPHRINE 0.3 MG/0.3ML IJ SOAJ: 0.3000 mg | Freq: Once | INTRAMUSCULAR | 11 refills | Status: AC

## 2017-03-14 NOTE — Addendum Note (Signed)
Addended by: Alysia Penna A on: 03/14/2017 05:52 PM   Modules accepted: Orders

## 2017-03-14 NOTE — Progress Notes (Signed)
   Subjective:    Patient ID: [name] [name], female    DOB: 10/11/1934, 82 y.o.   MRN: [id number]  HPI Here to follow up some issues. She complains of some mild fatigue and she thinks her Synthroid dose may be too low. Otherwise she is doing well. Her moods have been stable. She as been on Celexa for about 16 years and wonders if she still needs it. She is active and her BP has been stable at home. She just took a course to learn how to fly fish.    Review of Systems  Constitutional: Positive for fatigue.  HENT: Negative.   Eyes: Negative.   Respiratory: Negative.   Cardiovascular: Negative.   Gastrointestinal: Negative.   Genitourinary: Negative.  Negative for decreased urine volume, difficulty urinating, dyspareunia, dysuria, enuresis, flank pain, frequency, hematuria, pelvic pain and urgency.  Musculoskeletal: Negative.   Skin: Negative.   Neurological: Negative.   Psychiatric/Behavioral: Negative.        Objective:   Physical Exam  Constitutional: She is oriented to person, place, and time. She appears well-developed and well-nourished. No distress.  HENT:  Head: Normocephalic and atraumatic.  Right Ear: External ear normal.  Left Ear: External ear normal.  Nose: Nose normal.  Mouth/Throat: Oropharynx is clear and moist. No oropharyngeal exudate.  Eyes: Conjunctivae and EOM are normal. Pupils are equal, round, and reactive to light. No scleral icterus.  Neck: Normal range of motion. Neck supple. No JVD present. No thyromegaly present.  Cardiovascular: Normal rate, regular rhythm, normal heart sounds and intact distal pulses.  Exam reveals no gallop and no friction rub.   No murmur heard. Pulmonary/Chest: Effort normal and breath sounds normal. No respiratory distress. She has no wheezes. She has no rales. She exhibits no tenderness.  Abdominal: Soft. Bowel sounds are normal. She exhibits no distension and no mass. There is no tenderness. There is no rebound and no  guarding.  Musculoskeletal: Normal range of motion. She exhibits no edema or tenderness.  Lymphadenopathy:    She has no cervical adenopathy.  Neurological: She is alert and oriented to person, place, and time. She has normal reflexes. No cranial nerve deficit. She exhibits normal muscle tone. Coordination normal.  Skin: Skin is warm and dry. No rash noted. No erythema.  Psychiatric: She has a normal mood and affect. Her behavior is normal. Judgment and thought content normal.          Assessment & Plan:  Her HTN is stable. We will get fasting labs to check her lipids and her thyroid levels. Her depression has resolved so she will stop taking Celexa.  [name] Penna, MD

## 2017-05-25 ENCOUNTER — Encounter: Payer: Self-pay | Admitting: Family Medicine

## 2017-06-01 ENCOUNTER — Other Ambulatory Visit: Payer: Self-pay | Admitting: Family Medicine

## 2017-06-02 ENCOUNTER — Other Ambulatory Visit: Payer: Self-pay | Admitting: Family Medicine

## 2017-06-02 MED ORDER — LEVOTHYROXINE SODIUM 100 MCG PO TABS: ORAL_TABLET | ORAL | 3 refills | Status: DC

## 2017-06-02 NOTE — Telephone Encounter (Signed)
Refill request for Synthroid 100 mcg take 1 po qd.

## 2017-06-02 NOTE — Telephone Encounter (Signed)
I sent script e-scribe to [facility].

## 2017-06-15 ENCOUNTER — Other Ambulatory Visit: Payer: Self-pay | Admitting: Family Medicine

## 2017-06-15 ENCOUNTER — Telehealth: Payer: Self-pay | Admitting: Family Medicine

## 2017-06-15 DIAGNOSIS — R309 Painful micturition, unspecified: Secondary | ICD-10-CM

## 2017-06-15 NOTE — Telephone Encounter (Signed)
I put in a future order for UA and left a voice message for pt with this information.

## 2017-06-15 NOTE — Telephone Encounter (Signed)
Pt requesting to give a urine sample, painful urination is the reason. Per Dr. Sarajane Jews okay to put in future order.

## 2017-06-16 ENCOUNTER — Other Ambulatory Visit: Payer: Self-pay | Admitting: Family Medicine

## 2017-06-16 ENCOUNTER — Other Ambulatory Visit (INDEPENDENT_AMBULATORY_CARE_PROVIDER_SITE_OTHER): Payer: Medicare Other

## 2017-06-16 DIAGNOSIS — N390 Urinary tract infection, site not specified: Secondary | ICD-10-CM

## 2017-06-16 LAB — POC URINALSYSI DIPSTICK (AUTOMATED)
BILIRUBIN UA: NEGATIVE
Blood, UA: NEGATIVE
GLUCOSE UA: NEGATIVE
Ketones, UA: NEGATIVE
Nitrite, UA: NEGATIVE
Protein, UA: NEGATIVE
UROBILINOGEN UA: 0.2 U/dL
pH, UA: 8.5 — AB (ref 5.0–8.0)

## 2017-06-16 MED ORDER — CIPROFLOXACIN HCL 500 MG PO TABS: 500.0000 mg | ORAL_TABLET | Freq: Two times a day (BID) | ORAL | 0 refills | Status: DC

## 2017-06-16 NOTE — Telephone Encounter (Signed)
See result note page, script was sent to pharmacy and spoke with pt.

## 2017-06-17 LAB — URINE CULTURE
MICRO NUMBER:: 81140317
SPECIMEN QUALITY:: ADEQUATE

## 2017-07-09 ENCOUNTER — Other Ambulatory Visit: Payer: Self-pay | Admitting: Family Medicine

## 2017-07-09 DIAGNOSIS — F32A Depression, unspecified: Secondary | ICD-10-CM

## 2017-07-09 DIAGNOSIS — F329 Major depressive disorder, single episode, unspecified: Secondary | ICD-10-CM

## 2017-10-04 ENCOUNTER — Telehealth: Payer: Self-pay

## 2017-10-04 ENCOUNTER — Other Ambulatory Visit: Payer: Self-pay

## 2017-10-04 ENCOUNTER — Other Ambulatory Visit (INDEPENDENT_AMBULATORY_CARE_PROVIDER_SITE_OTHER): Payer: Medicare Other

## 2017-10-04 DIAGNOSIS — R300 Dysuria: Secondary | ICD-10-CM | POA: Diagnosis not present

## 2017-10-04 LAB — POC URINALSYSI DIPSTICK (AUTOMATED)
BILIRUBIN UA: NEGATIVE
Glucose, UA: NEGATIVE
Ketones, UA: NEGATIVE
NITRITE UA: NEGATIVE
PH UA: 7 (ref 5.0–8.0)
PROTEIN UA: NEGATIVE
RBC UA: NEGATIVE
Spec Grav, UA: 1.01 (ref 1.010–1.025)
UROBILINOGEN UA: 0.2 U/dL

## 2017-10-04 NOTE — Telephone Encounter (Signed)
Pt came by the office today and dropped off her urine to be tested for a UTI. Pt did not want to make an appt. We have put orders in to have her urine tested today.   Pt stated that she came down with a kidney infection a few days ago. She through she could flush the infection with lots of water which has not been successful.  Call back at [phone]

## 2017-10-05 LAB — URINE CULTURE
MICRO NUMBER: 90128045
SPECIMEN QUALITY:: ADEQUATE

## 2017-10-06 ENCOUNTER — Other Ambulatory Visit: Payer: Self-pay

## 2017-10-06 MED ORDER — CIPROFLOXACIN HCL 500 MG PO TABS: 500.0000 mg | ORAL_TABLET | Freq: Two times a day (BID) | ORAL | 0 refills | Status: DC

## 2017-10-06 NOTE — Telephone Encounter (Signed)
Pt is here now in the office waiting on her lab results on her urine sample left on 1/30  Sent to PCP lab results are in

## 2017-10-06 NOTE — Telephone Encounter (Signed)
Cipro 500 mg bid for 7 days

## 2017-10-06 NOTE — Telephone Encounter (Signed)
See my Result Note  

## 2017-10-06 NOTE — Telephone Encounter (Signed)
Spoke with pt. Pt advised and voiced understanding Rx was sent into pt's pharmacy.

## 2018-03-03 ENCOUNTER — Other Ambulatory Visit: Payer: Self-pay | Admitting: Family Medicine

## 2018-03-03 DIAGNOSIS — I10 Essential (primary) hypertension: Secondary | ICD-10-CM

## 2018-03-05 NOTE — Telephone Encounter (Signed)
Will refill for three months 90 day supply pt will need an office visit for more refills thanks.

## 2018-03-05 NOTE — Telephone Encounter (Signed)
Last OV 03/14/2017   Last refilled 03/14/2017 disp 90 with 3 refills

## 2018-03-12 ENCOUNTER — Other Ambulatory Visit: Payer: Self-pay | Admitting: Family Medicine

## 2018-03-12 DIAGNOSIS — E782 Mixed hyperlipidemia: Secondary | ICD-10-CM

## 2018-03-12 NOTE — Telephone Encounter (Signed)
30 day supply with no refills pt is due for an office visit for more refills

## 2018-03-23 ENCOUNTER — Other Ambulatory Visit: Payer: Self-pay | Admitting: Family Medicine

## 2018-03-23 DIAGNOSIS — I10 Essential (primary) hypertension: Secondary | ICD-10-CM

## 2018-03-23 NOTE — Telephone Encounter (Signed)
30 day supply sent with no refills pt will need an OV for more refills thanks

## 2018-03-24 DIAGNOSIS — J069 Acute upper respiratory infection, unspecified: Secondary | ICD-10-CM | POA: Diagnosis not present

## 2018-03-24 DIAGNOSIS — J029 Acute pharyngitis, unspecified: Secondary | ICD-10-CM | POA: Diagnosis not present

## 2018-03-24 DIAGNOSIS — J019 Acute sinusitis, unspecified: Secondary | ICD-10-CM | POA: Diagnosis not present

## 2018-04-04 ENCOUNTER — Ambulatory Visit (INDEPENDENT_AMBULATORY_CARE_PROVIDER_SITE_OTHER): Payer: Medicare Other | Admitting: Family Medicine

## 2018-04-04 ENCOUNTER — Encounter: Payer: Self-pay | Admitting: Family Medicine

## 2018-04-04 VITALS — BP 138/60 | HR 61 | Temp 98.2°F | Ht 65.0 in | Wt 154.7 lb

## 2018-04-04 DIAGNOSIS — E782 Mixed hyperlipidemia: Secondary | ICD-10-CM

## 2018-04-04 DIAGNOSIS — J209 Acute bronchitis, unspecified: Secondary | ICD-10-CM | POA: Diagnosis not present

## 2018-04-04 DIAGNOSIS — M353 Polymyalgia rheumatica: Secondary | ICD-10-CM | POA: Diagnosis not present

## 2018-04-04 DIAGNOSIS — I10 Essential (primary) hypertension: Secondary | ICD-10-CM

## 2018-04-04 DIAGNOSIS — E890 Postprocedural hypothyroidism: Secondary | ICD-10-CM

## 2018-04-04 LAB — LIPID PANEL
CHOL/HDL RATIO: 4
Cholesterol: 156 mg/dL (ref 0–200)
HDL: 36.5 mg/dL — AB (ref >39.00)
LDL Cholesterol: 85 mg/dL (ref 0–99)
NONHDL: 119.67
Triglycerides: 172 mg/dL — ABNORMAL HIGH (ref 0.0–149.0)
VLDL: 34.4 mg/dL (ref 0.0–40.0)

## 2018-04-04 LAB — BASIC METABOLIC PANEL
BUN: 9 mg/dL (ref 6–23)
CO2: 30 meq/L (ref 19–32)
Calcium: 9.4 mg/dL (ref 8.4–10.5)
Chloride: 95 mEq/L — ABNORMAL LOW (ref 96–112)
Creatinine, Ser: 0.62 mg/dL (ref 0.40–1.20)
GFR: 98.28 mL/min (ref >60.00)
Glucose, Bld: 98 mg/dL (ref 70–99)
Potassium: 4.6 mEq/L (ref 3.5–5.1)
Sodium: 132 mEq/L — ABNORMAL LOW (ref 135–145)

## 2018-04-04 LAB — HEPATIC FUNCTION PANEL
ALK PHOS: 58 U/L (ref 39–117)
ALT: 11 U/L (ref 0–35)
AST: 18 U/L (ref 0–37)
Albumin: 4.1 g/dL (ref 3.5–5.2)
BILIRUBIN DIRECT: 0.1 mg/dL (ref 0.0–0.3)
TOTAL PROTEIN: 6.5 g/dL (ref 6.0–8.3)
Total Bilirubin: 0.5 mg/dL (ref 0.2–1.2)

## 2018-04-04 LAB — CBC WITH DIFFERENTIAL/PLATELET
BASOS PCT: 0.5 % (ref 0.0–3.0)
Basophils Absolute: 0 10*3/uL (ref 0.0–0.1)
EOS ABS: 0.3 10*3/uL (ref 0.0–0.7)
EOS PCT: 5.1 % — AB (ref 0.0–5.0)
HCT: 38.8 % (ref 36.0–46.0)
Hemoglobin: 13.5 g/dL (ref 12.0–15.0)
Lymphocytes Relative: 35.6 % (ref 12.0–46.0)
Lymphs Abs: 2.3 10*3/uL (ref 0.7–4.0)
MCHC: 34.8 g/dL (ref 30.0–36.0)
MCV: 90.7 fl (ref 78.0–100.0)
Monocytes Absolute: 0.5 10*3/uL (ref 0.1–1.0)
Monocytes Relative: 7.2 % (ref 3.0–12.0)
NEUTROS ABS: 3.3 10*3/uL (ref 1.4–7.7)
Neutrophils Relative %: 51.6 % (ref 43.0–77.0)
PLATELETS: 327 10*3/uL (ref 150.0–400.0)
RBC: 4.28 Mil/uL (ref 3.87–5.11)
RDW: 12.4 % (ref 11.5–15.5)
WBC: 6.4 10*3/uL (ref 4.0–10.5)

## 2018-04-04 LAB — T4, FREE: Free T4: 1.4 ng/dL (ref 0.60–1.60)

## 2018-04-04 LAB — C-REACTIVE PROTEIN: CRP: 0.5 mg/dL (ref 0.5–20.0)

## 2018-04-04 LAB — SEDIMENTATION RATE: Sed Rate: 28 mm/hr (ref 0–30)

## 2018-04-04 LAB — TSH: TSH: 0.15 u[IU]/mL — ABNORMAL LOW (ref 0.35–4.50)

## 2018-04-04 LAB — T3, FREE: T3, Free: 3 pg/mL (ref 2.3–4.2)

## 2018-04-04 NOTE — Progress Notes (Signed)
   Subjective:    Patient ID: [name] [name], female    DOB: 1934/10/09, 83 y.o.   MRN: [id number]  HPI Here to follow up on issues. She feels great. Her BP has been stable. She was treated recently for a bronchitis with 7 days of Levaquin, and now this seems to have resolved.    Review of Systems  Constitutional: Negative.   Respiratory: Negative.   Cardiovascular: Negative.   Gastrointestinal: Negative.   Musculoskeletal: Positive for arthralgias.  Neurological: Negative.        Objective:   Physical Exam  Constitutional: She is oriented to person, place, and time. She appears well-developed and well-nourished.  Neck: No thyromegaly present.  Cardiovascular: Normal rate, regular rhythm, normal heart sounds and intact distal pulses.  Pulmonary/Chest: Effort normal and breath sounds normal. No stridor. No respiratory distress. She has no wheezes. She has no rales.  Lymphadenopathy:    She has no cervical adenopathy.  Neurological: She is alert and oriented to person, place, and time.          Assessment & Plan:  Her recent bronchitis has resolved. Her HTN is stable. We will get fasting labs today to check her lipids, her thyroid levels, and inflammatory markers for her arthritis.  Alysia Penna, MD

## 2018-04-06 ENCOUNTER — Other Ambulatory Visit: Payer: Self-pay | Admitting: Family Medicine

## 2018-04-06 DIAGNOSIS — E782 Mixed hyperlipidemia: Secondary | ICD-10-CM

## 2018-04-23 ENCOUNTER — Other Ambulatory Visit: Payer: Self-pay | Admitting: Family Medicine

## 2018-04-23 DIAGNOSIS — I10 Essential (primary) hypertension: Secondary | ICD-10-CM

## 2018-04-27 ENCOUNTER — Other Ambulatory Visit: Payer: Self-pay

## 2018-04-27 DIAGNOSIS — F32A Depression, unspecified: Secondary | ICD-10-CM

## 2018-04-27 DIAGNOSIS — I10 Essential (primary) hypertension: Secondary | ICD-10-CM

## 2018-04-27 DIAGNOSIS — F329 Major depressive disorder, single episode, unspecified: Secondary | ICD-10-CM

## 2018-04-27 MED ORDER — LEVOTHYROXINE SODIUM 100 MCG PO TABS: ORAL_TABLET | ORAL | 3 refills | Status: DC

## 2018-04-27 MED ORDER — METOPROLOL TARTRATE 100 MG PO TABS: ORAL_TABLET | ORAL | 3 refills | Status: DC

## 2018-04-27 MED ORDER — CITALOPRAM HYDROBROMIDE 20 MG PO TABS: ORAL_TABLET | ORAL | 3 refills | Status: DC

## 2018-04-27 MED ORDER — SPIRONOLACTONE 25 MG PO TABS: ORAL_TABLET | ORAL | 3 refills | Status: DC

## 2018-04-27 NOTE — Telephone Encounter (Signed)
Pt is upset that none of her prescriptions were refilled after her last OV with Dr. Sarajane Jews needs all mediations refilled and sent to [facility] in [place]   Sent to PCP to advise   Last OV 04/04/2018   Refill the folowing   celexa Levothyroxine Metoprolol Spironolactone

## 2018-04-27 NOTE — Telephone Encounter (Signed)
Please refill all these for one year  

## 2018-07-21 ENCOUNTER — Encounter (HOSPITAL_COMMUNITY)
Admission: EM | Disposition: A | Discharge status: Home / Self Care | Payer: Self-pay | Source: Home / Self Care | Attending: Emergency Medicine

## 2018-07-21 ENCOUNTER — Encounter (HOSPITAL_BASED_OUTPATIENT_CLINIC_OR_DEPARTMENT_OTHER): Payer: Self-pay | Admitting: Emergency Medicine

## 2018-07-21 ENCOUNTER — Emergency Department (HOSPITAL_COMMUNITY): Payer: Medicare Other | Admitting: Certified Registered Nurse Anesthetist

## 2018-07-21 ENCOUNTER — Other Ambulatory Visit: Payer: Self-pay

## 2018-07-21 ENCOUNTER — Ambulatory Visit (HOSPITAL_BASED_OUTPATIENT_CLINIC_OR_DEPARTMENT_OTHER)
Admission: EM | Admit: 2018-07-21 | Discharge: 2018-07-21 | Disposition: A | Discharge status: Home / Self Care | Payer: Medicare Other | Attending: Emergency Medicine | Admitting: Emergency Medicine

## 2018-07-21 DIAGNOSIS — T18128A Food in esophagus causing other injury, initial encounter: Secondary | ICD-10-CM | POA: Diagnosis not present

## 2018-07-21 DIAGNOSIS — K589 Irritable bowel syndrome without diarrhea: Secondary | ICD-10-CM | POA: Diagnosis not present

## 2018-07-21 DIAGNOSIS — E785 Hyperlipidemia, unspecified: Secondary | ICD-10-CM | POA: Diagnosis not present

## 2018-07-21 DIAGNOSIS — K2210 Ulcer of esophagus without bleeding: Secondary | ICD-10-CM | POA: Insufficient documentation

## 2018-07-21 DIAGNOSIS — E039 Hypothyroidism, unspecified: Secondary | ICD-10-CM | POA: Diagnosis not present

## 2018-07-21 DIAGNOSIS — Z880 Allergy status to penicillin: Secondary | ICD-10-CM | POA: Diagnosis not present

## 2018-07-21 DIAGNOSIS — I10 Essential (primary) hypertension: Secondary | ICD-10-CM | POA: Insufficient documentation

## 2018-07-21 DIAGNOSIS — M353 Polymyalgia rheumatica: Secondary | ICD-10-CM | POA: Insufficient documentation

## 2018-07-21 DIAGNOSIS — K222 Esophageal obstruction: Secondary | ICD-10-CM | POA: Diagnosis not present

## 2018-07-21 HISTORY — DX: Other complications of anesthesia, initial encounter: T88.59XA

## 2018-07-21 HISTORY — DX: Nausea with vomiting, unspecified: R11.2

## 2018-07-21 HISTORY — DX: Adverse effect of unspecified anesthetic, initial encounter: T41.45XA

## 2018-07-21 HISTORY — PX: FOREIGN BODY REMOVAL: SHX962

## 2018-07-21 HISTORY — PX: ESOPHAGOGASTRODUODENOSCOPY (EGD) WITH PROPOFOL: SHX5813

## 2018-07-21 HISTORY — DX: Other specified postprocedural states: Z98.890

## 2018-07-21 SURGERY — ESOPHAGOGASTRODUODENOSCOPY (EGD) WITH PROPOFOL
Anesthesia: General

## 2018-07-21 MED ORDER — OMEPRAZOLE 20 MG PO CPDR: 20.0000 mg | DELAYED_RELEASE_CAPSULE | Freq: Two times a day (BID) | ORAL | 1 refills | Status: DC

## 2018-07-21 MED ORDER — LIDOCAINE 2% (20 MG/ML) 5 ML SYRINGE
INTRAMUSCULAR | Status: DC | PRN
  Administered 2018-07-21: 60 mg via INTRAVENOUS

## 2018-07-21 MED ORDER — FENTANYL CITRATE (PF) 100 MCG/2ML IJ SOLN
INTRAMUSCULAR | Status: AC
  Filled 2018-07-21: qty 2

## 2018-07-21 MED ORDER — PROPOFOL 10 MG/ML IV BOLUS
INTRAVENOUS | Status: AC
  Filled 2018-07-21: qty 40

## 2018-07-21 MED ORDER — SUCCINYLCHOLINE CHLORIDE 20 MG/ML IJ SOLN
INTRAMUSCULAR | Status: DC | PRN
  Administered 2018-07-21: 120 mg via INTRAVENOUS

## 2018-07-21 MED ORDER — ONDANSETRON HCL 4 MG/2ML IJ SOLN
INTRAMUSCULAR | Status: DC | PRN
  Administered 2018-07-21: 4 mg via INTRAVENOUS

## 2018-07-21 MED ORDER — FENTANYL CITRATE (PF) 100 MCG/2ML IJ SOLN
INTRAMUSCULAR | Status: DC | PRN
  Administered 2018-07-21: 100 ug via INTRAVENOUS

## 2018-07-21 MED ORDER — SODIUM CHLORIDE 0.9 % IV SOLN
INTRAVENOUS | Status: DC
  Administered 2018-07-21: 10:00:00 via INTRAVENOUS

## 2018-07-21 MED ORDER — PROPOFOL 500 MG/50ML IV EMUL
INTRAVENOUS | Status: DC | PRN
  Administered 2018-07-21: 130 mg via INTRAVENOUS

## 2018-07-21 SURGICAL SUPPLY — 14 items

## 2018-07-21 NOTE — ED Triage Notes (Signed)
Reports ate pork last night around 1800 last night.  Reports vomiting since then.

## 2018-07-21 NOTE — ED Provider Notes (Addendum)
[facility] EMERGENCY DEPARTMENT Provider Note   CSN: [id number] Arrival date & time: 07/21/18  3818     History   Chief Complaint Chief Complaint  Patient presents with  . Food Stuck in Throat    HPI [name] [name] is a 83 y.o. female.  HPI Patient reports that she was eating a pork roast last night, she took her first bite and it got stuck in her esophagus.  She tried throughout the night to get it to pass.  She reports all that has been happening is she has been spitting up clear saliva all night.  That occurred at 6 PM.  Patient denies any shortness of breath or pain.  She reports that she has had this happen about 3 times over the course of the year.  She reports typically however within about 2 hours she can either vomited back up or it passes on its own.  She has not had to have upper endoscopy to get removed.  She reports that she had been seen by Dr. Maurene Capes at [place] for IBS for about 20 years.  Once Dr. Maurene Capes retired, she did not continue to have follow-up.  She denies she has had any problems with chronic reflux. Past Medical History:  Diagnosis Date  . Allergic angioedema    from shellfish and tree nuts  . Chest pain 2000 and 2001   negative cardiac workup  . Diverticulosis   . Fibrocystic breast disease   . Hyperlipidemia   . Hypertension   . Irritable bowel syndrome   . Perirectal cyst    ?lymphocoele  . PMR (polymyalgia rheumatica) ([place]) 2011  . Seborrheic dermatitis   . Squamous cell skin cancer    X1, [facility]  . Thyroid disease    post op hypothyroidism; TSH goal would be sup;pressive    Patient Active Problem List   Diagnosis Date Noted  . DERMATITIS, SEBORRHEIC 07/14/2010  . Squamous cell skin cancer 07/14/2010  . Polymyalgia rheumatica ([place]) 06/23/2010  . POSTMENOPAUSAL SYNDROME 06/03/2010  . ARTHRALGIA 06/03/2010  . THYROID CANCER, HX OF 02/06/2009  . Hypothyroidism, postsurgical 06/16/2008  . DIVERTICULOSIS, COLON 06/16/2008  .  HYPERLIPIDEMIA 06/29/2007  . Essential hypertension 01/05/2007  . IBS 01/05/2007  . FIBROCYSTIC BREAST DISEASE 01/05/2007    Past Surgical History:  Procedure Laterality Date  . COLONOSCOPY  2004   per Dr. Olevia Perches, clear but incomplete; Barium study added, repeat in 10 yrs   . DILATION AND CURETTAGE OF UTERUS    . exporatory surgery for rectal mass  1986   peri rectal cyst  . THYROIDECTOMY  1988   S/P radiation previously seen by Liliane Bade, MD @ [facility], now followed by Dr. Leonides Schanz  . TONSILLECTOMY  1972     OB History   None      Home Medications    Prior to Admission medications   Medication Sig Start Date End Date Taking? Authorizing Provider  citalopram (CELEXA) 20 MG tablet TAKE 1/2 TABLET BY MOUTH DAILY AS DIRECTED 04/27/18   [name], MD  diphenhydrAMINE (BENADRYL) 25 mg capsule Take 25 mg by mouth as needed.      [provider]  levothyroxine (SYNTHROID, LEVOTHROID) 100 MCG tablet TAKE 1 TABLET(100 MCG) BY MOUTH DAILY 04/27/18   [name], MD  metoprolol tartrate (LOPRESSOR) 100 MG tablet TAKE 1 TABLET BY MOUTH EVERY MORNING AND 1/2 TABLET EVERY EVENING 04/27/18   [name], MD  pravastatin (PRAVACHOL) 40 MG  tablet TAKE 1 TABLET BY MOUTH 1 TIME IN THE EVENING AS DIRECTED 04/06/18   [name], MD  spironolactone (ALDACTONE) 25 MG tablet Take once daily 04/27/18   [name], MD    Family History Family History  Problem Relation Age of Onset  . Parkinsonism Brother   . Hypertension Mother   . Prostate cancer Father   . Breast cancer Maternal Grandmother   . Hypertension Maternal Grandmother   . Heart attack Paternal Uncle 38  . Heart attack Paternal Grandfather 22    Social History Social History   Tobacco Use  . Smoking status: Never Smoker  . Smokeless tobacco: Never Used  Substance Use Topics  . Alcohol use: Yes    Alcohol/week: 0.0 standard drinks    Comment: very rarely  . Drug use: No     Allergies     Horse-derived products; Other; Penicillins; Shellfish-derived products; Bacitracin-polymyxin b; Eggs or egg-derived products; and Neomycin-bacitracin zn-polymyx   Review of Systems Review of Systems Constitutional: No recent fever chills or general illness. Respiratory: No recent cough, no shortness of breath. Cardiovascular: no recent chest pain, no lightheadedness, no syncope.  Physical Exam Updated Vital Signs BP (!) 169/66 (BP Location: Left Arm)   Pulse 86   Temp 98.2 F (36.8 C) (Oral)   Resp 16   Ht 5\' 4"  (1.626 m)   Wt 70.3 kg   SpO2 97%   BMI 26.61 kg/m   Physical Exam  Constitutional:  Patient is alert and appropriate.  She is sitting in a chair at the bedside.  No respiratory distress.  Mental status is clear.  Patient is intermittently spitting clear saliva into a cup that she is holding.  HENT:  Head: Normocephalic and atraumatic.  Mouth/Throat: Oropharynx is clear and moist.  Eyes: EOM are normal.  Neck: Neck supple.  Cardiovascular: Normal rate, regular rhythm and normal heart sounds.  Pulmonary/Chest: Effort normal and breath sounds normal.  Abdominal: Soft. She exhibits no distension.  Neurological: She is alert. She exhibits normal muscle tone. Coordination normal.  Skin: Skin is warm and dry.  Psychiatric: She has a normal mood and affect.     ED Treatments / Results  Labs (all labs ordered are listed, but only abnormal results are displayed) Labs Reviewed - No data to display  EKG None  Radiology No results found.  Procedures Procedures (including critical care time)  Medications Ordered in ED Medications - No data to display   Initial Impression / Assessment and Plan / ED Course  I have reviewed the triage vital signs and the nursing notes.  Pertinent labs & imaging results that were available during my care of the patient were reviewed by me and considered in my medical decision making (see chart for details).  Clinical Course as  of Jul 21 858  Sat Jul 21, 2018  [address] has returned call for consultation.  Made aware of the patient being transferred to [facility], ED for esophageal food impaction.   [MP]    Clinical Course User Index [MP] Charlesetta Shanks, MD   Consult: Placed for gastroenterology low Exie Parody.  Pending at this time. Consult: Reviewed with ED physician Dr. Merrily Pew long to accept for transfer to [facility] long emergency department.  Patient has esophageal food impaction.  She is otherwise clinically well in appearance.  Alert and appropriate.  No respiratory distress.  Patient will go by private vehicle directly to [facility] long emergency department for anticipated upper endoscopy for  esophageal food impaction.  Final Clinical Impressions(s) / ED Diagnoses   Final diagnoses:  Esophageal obstruction due to food impaction    ED Discharge Orders    None      Charlesetta Shanks, MD 07/21/18 4314  Charlesetta Shanks, MD 07/21/18 [phone]

## 2018-07-21 NOTE — Op Note (Signed)
[facility] Patient Name: [name] [name] Procedure Date: 07/21/2018 MRN: [id number] Attending MD: Jackquline Denmark , MD Date of Birth: 1935/02/16 CSN: [id number] Age: 83 Admit Type: Outpatient Procedure:                Upper GI endoscopy Indications:              Foreign body in the esophagus Providers:                Jackquline Denmark, MD, Zenon Mayo, RN, William Dalton, Technician Referring MD:              Medicines:                General Anesthesia Complications:            No immediate complications. Estimated Blood Loss:     Estimated blood loss: none. Procedure:                Pre-Anesthesia Assessment:                           - Prior to the procedure, a History and Physical                            was performed, and patient medications and                            allergies were reviewed. The patient's tolerance of                            previous anesthesia was also reviewed. The risks                            and benefits of the procedure and the sedation                            options and risks were discussed with the patient.                            All questions were answered, and informed consent                            was obtained. Prior Anticoagulants: The patient has                            taken no previous anticoagulant or antiplatelet                            agents. ASA Grade Assessment: E - Emergency. After                            reviewing the risks and benefits, the patient was  deemed in satisfactory condition to undergo the                            procedure.                           After obtaining informed consent, the endoscope was                            passed under direct vision. Throughout the                            procedure, the patient's blood pressure, pulse, and                            oxygen saturations were monitored continuously. The                             GIF-H190 ([id number]) Olympus adult endoscope was                            introduced through the mouth, and advanced to the                            second part of duodenum. The upper GI endoscopy was                            accomplished without difficulty. The patient                            tolerated the procedure well. Scope In: Scope Out: Findings:      Food was found in the middle third of the esophagus and in the lower       third of the esophagus. Scope was gently passed around with resultant       successful disimpaction.      Underlying moderate 1.2 cm stricture with moderate surrounding       esophagitis was found 36 cm from the incisors at GE junction. Biopsies       were taken with a cold forceps for histology.      The gastric mucosa was normal. No fundal masses.      The duodenum was normal. Impression:               -Food impaction status post successful endoscopic                            disimpaction.                           -Underlying distal esophageal stricture with                            moderate esophagitis. Moderate Sedation:      none Recommendation:           - Full liquid diet today. Soft diet tomorrow.  Thereafter can restart normal diet. Chew foods                            especially meats and breads well and eat slowly.                           - Use Prilosec (omeprazole) 20 mg PO BID.                           - Await pathology results.                           - Return to GI clinic in 1 week.                           - Repeat upper endoscopy in 3-4 weeks with                            esophageal dilatation.                           - Discussed in detail with the patient and                            patient's friend (visiting from [place]) Procedure Code(s):        --- Professional ---                           [phone], Esophagogastroduodenoscopy, flexible,                             transoral; with removal of foreign body(s)                           43239, Esophagogastroduodenoscopy, flexible,                            transoral; with biopsy, single or multiple Diagnosis Code(s):        --- Professional ---                           X91.478G, Food in esophagus causing other injury,                            initial encounter                           K22.2, Esophageal obstruction                           T18.108A, Unspecified foreign body in esophagus                            causing other injury, initial encounter CPT copyright 2018 American Medical Association. All rights reserved. The codes documented in this report are preliminary and upon coder review may  be revised to meet  current compliance requirements. Jackquline Denmark, MD 07/21/2018 11:44:19 AM This report has been signed electronically. Number of Addenda: 0

## 2018-07-21 NOTE — Consult Note (Addendum)
Consultation  Referring Provider:   Dr. Wilson Singer   Primary Care Physician:  [name], MD Primary Gastroenterologist: Dr. Olevia Perches         Reason for Consultation:  Food impaction            HPI:   [name] [name] is an 83 y.o. female with a past medical history as listed below, who presented to the ER in [facility] this morning with complaint of "food stuck in my throat".    Today, patient is found with her friend who does assist with history, she explains that she ate some pork tenderloin last night around 630 and after taking her first bite it got stuck in her esophagus.  She tried to get this out with big gulps of water and even trying to make herself vomit but nothing made it move.  Throughout the night she continued to spit up her saliva as it did not go down.  Reports this has happened about 3 times over the past year and is aware that she needed to see a GI physician but was trying to put this off.  Typically during other episodes within about 2 hours she has been able to vomited it up or it has passed on its own.  Denies any previous EGDs.    Denies fever, chills, blood in her stool or abdominal pain.  Past Medical History:  Diagnosis Date  . Allergic angioedema    from shellfish and tree nuts  . Chest pain 2000 and 2001   negative cardiac workup  . Diverticulosis   . Fibrocystic breast disease   . Hyperlipidemia   . Hypertension   . Irritable bowel syndrome   . Perirectal cyst    ?lymphocoele  . PMR (polymyalgia rheumatica) ([place]) 2011  . Seborrheic dermatitis   . Squamous cell skin cancer    X1, [facility]  . Thyroid disease    post op hypothyroidism; TSH goal would be sup;pressive    Past Surgical History:  Procedure Laterality Date  . COLONOSCOPY  2004   per Dr. Olevia Perches, clear but incomplete; Barium study added, repeat in 10 yrs   . DILATION AND CURETTAGE OF UTERUS    . exporatory surgery for rectal mass  1986   peri rectal cyst  . THYROIDECTOMY  1988   S/P  radiation previously seen by Liliane Bade, MD @ [facility], now followed by Dr. Leonides Schanz  . TONSILLECTOMY  1972    Family History  Problem Relation Age of Onset  . Parkinsonism Brother   . Hypertension Mother   . Prostate cancer Father   . Breast cancer Maternal Grandmother   . Hypertension Maternal Grandmother   . Heart attack Paternal Uncle 63  . Heart attack Paternal Grandfather 72    Social History   Tobacco Use  . Smoking status: Never Smoker  . Smokeless tobacco: Never Used  Substance Use Topics  . Alcohol use: Yes    Alcohol/week: 0.0 standard drinks    Comment: very rarely  . Drug use: No    Prior to Admission medications   Medication Sig Start Date End Date Taking? Authorizing Provider  citalopram (CELEXA) 20 MG tablet TAKE 1/2 TABLET BY MOUTH DAILY AS DIRECTED 04/27/18   [name], MD  diphenhydrAMINE (BENADRYL) 25 mg capsule Take 25 mg by mouth as needed.      [provider]  levothyroxine (SYNTHROID, LEVOTHROID) 100 MCG tablet TAKE 1 TABLET(100 MCG) BY MOUTH DAILY 04/27/18  [name], MD  metoprolol tartrate (LOPRESSOR) 100 MG tablet TAKE 1 TABLET BY MOUTH EVERY MORNING AND 1/2 TABLET EVERY EVENING 04/27/18   [name], MD  pravastatin (PRAVACHOL) 40 MG tablet TAKE 1 TABLET BY MOUTH 1 TIME IN THE EVENING AS DIRECTED 04/06/18   [name], MD  spironolactone (ALDACTONE) 25 MG tablet Take once daily 04/27/18   [name], MD    Current Facility-Administered Medications  Medication Dose Route Frequency Provider Last Rate Last Dose  . 0.9 %  sodium chloride infusion   Intravenous Continuous Virgel Manifold, MD 100 mL/hr at 07/21/18 1610     Current Outpatient Medications  Medication Sig Dispense Refill  . citalopram (CELEXA) 20 MG tablet TAKE 1/2 TABLET BY MOUTH DAILY AS DIRECTED 135 tablet 3  . diphenhydrAMINE (BENADRYL) 25 mg capsule Take 25 mg by mouth as needed.      Marland Kitchen levothyroxine (SYNTHROID, LEVOTHROID) 100 MCG tablet TAKE 1  TABLET(100 MCG) BY MOUTH DAILY 90 tablet 3  . metoprolol tartrate (LOPRESSOR) 100 MG tablet TAKE 1 TABLET BY MOUTH EVERY MORNING AND 1/2 TABLET EVERY EVENING 135 tablet 3  . pravastatin (PRAVACHOL) 40 MG tablet TAKE 1 TABLET BY MOUTH 1 TIME IN THE EVENING AS DIRECTED 30 tablet 11  . spironolactone (ALDACTONE) 25 MG tablet Take once daily 90 tablet 3    Allergies as of 07/21/2018 - Review Complete 07/21/2018  Allergen Reaction Noted  . Horse-derived products    . Other Anaphylaxis 11/19/2013  . Penicillins    . Shellfish-derived products  12/01/2010  . Bacitracin-polymyxin b    . Eggs or egg-derived products    . Neomycin-bacitracin zn-polymyx       Review of Systems:    Constitutional: No weight loss, fever or chills Skin: No rash  Cardiovascular: No chest pain  Respiratory: No SOB  Gastrointestinal: See HPI and otherwise negative Genitourinary: No dysuria Neurological: No headache, dizziness or syncope Musculoskeletal: No new muscle or joint pain Hematologic: No bleeding  Psychiatric: No history of depression or anxiety    Physical Exam:  Vital signs in last 24 hours: Temp:  [98.2 F (36.8 C)] 98.2 F (36.8 C) (11/16 0827) Pulse Rate:  [86-87] 87 (11/16 1000) Resp:  [16] 16 (11/16 0827) BP: (159-169)/(66-79) 159/79 (11/16 1000) SpO2:  [97 %-98 %] 98 % (11/16 1000) Weight:  [70.3 kg] 70.3 kg (11/16 0828)   General:   Pleasant Caucasian female appears to be in NAD, Well developed, Well nourished, alert and cooperative +spitting saliva into blue emesis bag Head:  Normocephalic and atraumatic. Eyes:   PEERL, EOMI. No icterus. Conjunctiva pink. Ears:  Normal auditory acuity. Neck:  Supple Throat: Oral cavity and pharynx without inflammation, swelling or lesion.  Lungs: Respirations even and unlabored. Lungs clear to auscultation bilaterally.   No wheezes, crackles, or rhonchi.  Heart: Normal S1, S2. No MRG. Regular rate and rhythm. No peripheral edema, cyanosis or pallor.    Abdomen:  Soft, nondistended, nontender. No rebound or guarding. Normal bowel sounds. No appreciable masses or hepatomegaly. Rectal:  Not performed.  Msk:  Symmetrical without gross deformities. Peripheral pulses intact.  Extremities:  Without edema, no deformity or joint abnormality. Normal ROM. Neurologic:  Alert and  oriented x4;  grossly normal neurologically.  Skin:   Dry and intact without significant lesions or rashes. Psychiatric: Demonstrates good judgement and reason without abnormal affect or behaviors.   Impression / Plan:   Impression: 1. Food impaction: Pork tenderloin got stuck at  6:30 PM last night, 3 episodes of this over the past year, though previously the food passed on it's own; likely esophageal stricture  Plan: 1.  EGD scheduled emergently with Dr. Lyndel Safe.  Did discuss risk, benefits, limitations and alternatives and patient agrees to proceed. 2.  Patient will likely need to follow in our office in the following weeks for outpatient EGD and dilation.  Did discuss this with the patient. 3.  Patient remain n.p.o. until after time procedure. 4.  Please await further recommendations from Dr. Lyndel Safe after procedure.  Thank you for your kind consultation, we will continue to follow.  Lavone Nian [facility]  07/21/2018, 10:32 AM   Attending physician's note   I have taken an interval history, reviewed the chart and examined the patient. I agree with the Advanced Practitioner's note, impression and recommendations.   83 year old with history of intermittent dysphagia, now with food impaction.  Plan: Endoscopic disimpaction ASAP, would need PPIs thereafter, gust risks and benefits including small but definite risks of esophageal perforation requiring thoracotomy and prolonged hospitalization, bleeding and aspiration.  Carmell Austria, MD

## 2018-07-21 NOTE — Discharge Instructions (Addendum)
Go directly to [facility] long emergency department.  You will need consultation with gastroenterology for an upper endoscopy to remove your food impaction.  Full liquid diet today, then soft diet tomorrow.  Chew foods very carefully there after.  Dr. Lyndel Safe wishes to schedule an appointment in the future to re-evaluate.   YOU HAD AN ENDOSCOPIC PROCEDURE TODAY: Refer to the procedure report and other information in the discharge instructions given to you for any specific questions about what was found during the examination. If this information does not answer your questions, please call [place] office at [phone] to clarify.   YOU SHOULD EXPECT: Some feelings of bloating in the abdomen. Passage of more gas than usual. Walking can help get rid of the air that was put into your GI tract during the procedure and reduce the bloating. If you had a lower endoscopy (such as a colonoscopy or flexible sigmoidoscopy) you may notice spotting of blood in your stool or on the toilet paper. Some abdominal soreness may be present for a day or two, also.  DIET: Your first meal following the procedure should be a light meal and then it is ok to progress to your normal diet. A half-sandwich or bowl of soup is an example of a good first meal. Heavy or fried foods are harder to digest and may make you feel nauseous or bloated. Drink plenty of fluids but you should avoid alcoholic beverages for 24 hours. If you had a esophageal dilation, please see attached instructions for diet.    ACTIVITY: Your care partner should take you home directly after the procedure. You should plan to take it easy, moving slowly for the rest of the day. You can resume normal activity the day after the procedure however YOU SHOULD NOT DRIVE, use power tools, machinery or perform tasks that involve climbing or major physical exertion for 24 hours (because of the sedation medicines used during the test).   SYMPTOMS TO REPORT IMMEDIATELY: A  gastroenterologist can be reached at any hour. Please call [phone]  for any of the following symptoms:  Following lower endoscopy (colonoscopy, flexible sigmoidoscopy) Excessive amounts of blood in the stool  Significant tenderness, worsening of abdominal pains  Swelling of the abdomen that is new, acute  Fever of 100 or higher  Following upper endoscopy (EGD, EUS, ERCP, esophageal dilation) Vomiting of blood or coffee ground material  New, significant abdominal pain  New, significant chest pain or pain under the shoulder blades  Painful or persistently difficult swallowing  New shortness of breath  Black, tarry-looking or red, bloody stools  FOLLOW UP:  If any biopsies were taken you will be contacted by phone or by letter within the next 1-3 weeks. Call [phone]  if you have not heard about the biopsies in 3 weeks.  Please also call with any specific questions about appointments or follow up tests.

## 2018-07-21 NOTE — ED Notes (Signed)
ED Provider at bedside. 

## 2018-07-21 NOTE — ED Provider Notes (Signed)
Patient briefly assessed upon arrival to [facility] ED  She is coming from [facility] for esophageal food impaction. Happened last night when eating pork loin. She has no new complaints since she left [facility].  She is occasionally spitting into a cup but otherwise appears very well.  Previously seen by Dr. Delfin Edis prior to her retirement.  Will place an IV and in anticipation of endoscopy.  We will start some fluids.  Mild touch was base with GI to let them know of her arrival.   Virgel Manifold, MD 07/21/18 [phone]

## 2018-07-21 NOTE — Transfer of Care (Signed)
Immediate Anesthesia Transfer of Care Note  Patient: [name] [name]  Procedure(s) Performed: ESOPHAGOGASTRODUODENOSCOPY (EGD) WITH PROPOFOL (N/A )  Patient Location: PACU  Anesthesia Type:General  Level of Consciousness: awake, alert  and oriented  Airway & Oxygen Therapy: Patient Spontanous Breathing and Patient connected to face mask oxygen  Post-op Assessment: Report given to RN and Post -op Vital signs reviewed and stable  Post vital signs: Reviewed and stable  Last Vitals:  Vitals Value Taken Time  BP    Temp    Pulse 105 07/21/2018 11:44 AM  Resp 19 07/21/2018 11:44 AM  SpO2 96 % 07/21/2018 11:44 AM  Vitals shown include unvalidated device data.  Last Pain:  Vitals:   07/21/18 1040  TempSrc:   PainSc: 0-No pain         Complications: No apparent anesthesia complications

## 2018-07-21 NOTE — Anesthesia Procedure Notes (Signed)
Procedure Name: Intubation Performed by: Jenice Leiner J, CRNA Pre-anesthesia Checklist: Patient identified, Emergency Drugs available, Suction available, Patient being monitored and Timeout performed Patient Re-evaluated:Patient Re-evaluated prior to induction Oxygen Delivery Method: Circle system utilized Preoxygenation: Pre-oxygenation with 100% oxygen Induction Type: IV induction and Rapid sequence Laryngoscope Size: Mac and 4 Grade View: Grade II Tube type: Oral Tube size: 7.0 mm Number of attempts: 1 Airway Equipment and Method: Stylet Placement Confirmation: ETT inserted through vocal cords under direct vision,  positive ETCO2 and breath sounds checked- equal and bilateral Secured at: 21 cm Tube secured with: Tape Dental Injury: Teeth and Oropharynx as per pre-operative assessment        

## 2018-07-21 NOTE — Anesthesia Preprocedure Evaluation (Signed)
Anesthesia Evaluation  Patient identified by MRN, date of birth, ID band Patient awake    Reviewed: Allergy & Precautions, NPO status , Patient's Chart, lab work & pertinent test results  History of Anesthesia Complications (+) PONV  Airway Mallampati: I  TM Distance: >3 FB Neck ROM: Full    Dental   Pulmonary    Pulmonary exam normal        Cardiovascular hypertension, Pt. on medications Normal cardiovascular exam     Neuro/Psych    GI/Hepatic   Endo/Other    Renal/GU      Musculoskeletal   Abdominal   Peds  Hematology   Anesthesia Other Findings   Reproductive/Obstetrics                             Anesthesia Physical Anesthesia Plan  ASA: II and emergent  Anesthesia Plan: General   Post-op Pain Management:    Induction: Intravenous, Rapid sequence and Cricoid pressure planned  PONV Risk Score and Plan: 4 or greater and Ondansetron, Midazolam and Treatment may vary due to age or medical condition  Airway Management Planned: Oral ETT  Additional Equipment:   Intra-op Plan:   Post-operative Plan: Extubation in OR  Informed Consent: I have reviewed the patients History and Physical, chart, labs and discussed the procedure including the risks, benefits and alternatives for the proposed anesthesia with the patient or authorized representative who has indicated his/her understanding and acceptance.     Plan Discussed with: CRNA and Surgeon  Anesthesia Plan Comments:         Anesthesia Quick Evaluation

## 2018-07-21 NOTE — Anesthesia Postprocedure Evaluation (Signed)
Anesthesia Post Note  Patient: LESHAY DESAULNIERS  Procedure(s) Performed: ESOPHAGOGASTRODUODENOSCOPY (EGD) WITH PROPOFOL (N/A )     Patient location during evaluation: PACU Anesthesia Type: General Level of consciousness: awake and alert Pain management: pain level controlled Vital Signs Assessment: post-procedure vital signs reviewed and stable Respiratory status: spontaneous breathing, nonlabored ventilation, respiratory function stable and patient connected to nasal cannula oxygen Cardiovascular status: blood pressure returned to baseline and stable Postop Assessment: no apparent nausea or vomiting Anesthetic complications: no    Last Vitals:  Vitals:   07/21/18 1145 07/21/18 1150  BP: (!) 154/62   Pulse: (!) 107 (!) 103  Resp: 14 18  Temp: 37.1 C   SpO2: 97% 94%    Last Pain:  Vitals:   07/21/18 1150  TempSrc:   PainSc: 0-No pain                 Gionni Vaca DAVID

## 2018-07-23 ENCOUNTER — Telehealth: Payer: Self-pay

## 2018-07-23 ENCOUNTER — Encounter (HOSPITAL_COMMUNITY): Payer: Self-pay | Admitting: Gastroenterology

## 2018-07-23 NOTE — Telephone Encounter (Signed)
-----   Message from Levin Erp, [place] sent at 07/21/2018 11:31 AM EST ----- Regarding: Needs appt with Dr. Lyndel Safe Needs appt with Dr. Donovan Kail in 1 week for set up for EGD with dilation after food impaction in ED today.  Thanks- Ellouise Newer, PA-C

## 2018-07-23 NOTE — Telephone Encounter (Signed)
07/31/18 1130 am appt with Dr Lyndel Safe The patient has been notified of this information and all questions answered.

## 2018-07-31 ENCOUNTER — Encounter: Payer: Self-pay | Admitting: Gastroenterology

## 2018-07-31 ENCOUNTER — Ambulatory Visit: Payer: Medicare Other | Admitting: Gastroenterology

## 2018-08-06 ENCOUNTER — Encounter: Payer: Self-pay | Admitting: Gastroenterology

## 2018-08-06 ENCOUNTER — Ambulatory Visit (INDEPENDENT_AMBULATORY_CARE_PROVIDER_SITE_OTHER): Payer: Medicare Other | Admitting: Gastroenterology

## 2018-08-06 VITALS — BP 122/72 | HR 62 | Ht 64.5 in | Wt 156.0 lb

## 2018-08-06 DIAGNOSIS — R1310 Dysphagia, unspecified: Secondary | ICD-10-CM

## 2018-08-06 MED ORDER — OMEPRAZOLE 20 MG PO CPDR: 20.0000 mg | DELAYED_RELEASE_CAPSULE | Freq: Every day | ORAL | 11 refills | Status: DC

## 2018-08-06 NOTE — Progress Notes (Signed)
Chief Complaint:   Referring Provider:  [name], MD      ASSESSMENT AND PLAN;   #1.  H/O Food impaction d/t esophageal stricture status post endoscopic disimpaction 07/21/2018.  Plan: - Omeprazole 20mg  po qd (dose reduced) - Ba swallow with Ba tablet. - EGD with dil if needed. - I have instructed patient that she needs to chew food especially meats and breads well and eat slowly.   HPI:    [name] [name] is a 83 y.o. female  For follow-up visit Had food impaction, status post endoscopic disimpaction on 07/21/2018 with underlying distal esophageal stricture with esophagitis.  Biopsies showed erosive esophagitis.  Patient has been maintained on omeprazole 20 mg p.o. twice daily ever since.  No heartburn or any further problems. She is reluctant to get a repeat EGD with dilation at the present time due to " need of someone to accompany the patient", her son lives in [place].  She did not want to ask any of her friends.  She is willing to go in for barium swallow.  No melena or hematochezia No weight loss.   Past Medical History:  Diagnosis Date  . Allergic angioedema    from shellfish and tree nuts  . Chest pain 2000 and 2001   negative cardiac workup  . Complication of anesthesia   . Diverticulosis   . Fibrocystic breast disease   . Hyperlipidemia   . Hypertension   . Irritable bowel syndrome   . Perirectal cyst    ?lymphocoele  . PMR (polymyalgia rheumatica) ([place]) 2011  . PONV (postoperative nausea and vomiting)   . Seborrheic dermatitis   . Squamous cell skin cancer    X1, [facility]  . Thyroid disease    post op hypothyroidism; TSH goal would be sup;pressive    Past Surgical History:  Procedure Laterality Date  . COLONOSCOPY  2004   per Dr. Olevia Perches, clear but incomplete; Barium study added, repeat in 10 yrs   . DILATION AND CURETTAGE OF UTERUS    . ESOPHAGOGASTRODUODENOSCOPY (EGD) WITH PROPOFOL N/A 07/21/2018   Procedure: ESOPHAGOGASTRODUODENOSCOPY  (EGD) WITH PROPOFOL;  Surgeon: Jackquline Denmark, MD;  Location: [facility];  Service: Endoscopy;  Laterality: N/A;  . exporatory surgery for rectal mass  1986   peri rectal cyst  . FOREIGN BODY REMOVAL  07/21/2018   Procedure: FOREIGN BODY REMOVAL;  Surgeon: Jackquline Denmark, MD;  Location: [facility];  Service: Endoscopy;;  . THYROIDECTOMY  1988   S/P radiation previously seen by Liliane Bade, MD @ [facility], now followed by Dr. Leonides Schanz  . TONSILLECTOMY  1972    Family History  Problem Relation Age of Onset  . Parkinsonism Brother   . Hypertension Mother   . Prostate cancer Father   . Breast cancer Maternal Grandmother   . Hypertension Maternal Grandmother   . Heart attack Paternal Uncle 50  . Heart attack Paternal Grandfather 2    Social History   Tobacco Use  . Smoking status: Never Smoker  . Smokeless tobacco: Never Used  Substance Use Topics  . Alcohol use: Yes    Alcohol/week: 0.0 standard drinks    Comment: very rarely  . Drug use: No    Current Outpatient Medications  Medication Sig Dispense Refill  . citalopram (CELEXA) 20 MG tablet TAKE 1/2 TABLET BY MOUTH DAILY AS DIRECTED 135 tablet 3  . diphenhydrAMINE (BENADRYL) 25 mg capsule Take 25 mg by mouth as needed.      Marland Kitchen levothyroxine (  SYNTHROID, LEVOTHROID) 100 MCG tablet TAKE 1 TABLET(100 MCG) BY MOUTH DAILY 90 tablet 3  . metoprolol tartrate (LOPRESSOR) 100 MG tablet TAKE 1 TABLET BY MOUTH EVERY MORNING AND 1/2 TABLET EVERY EVENING 135 tablet 3  . omeprazole (PRILOSEC) 20 MG capsule Take 1 capsule (20 mg total) by mouth 2 (two) times daily. Take half an hour before breakfast and half an hour before supper 60 capsule 1  . pravastatin (PRAVACHOL) 40 MG tablet TAKE 1 TABLET BY MOUTH 1 TIME IN THE EVENING AS DIRECTED 30 tablet 11  . spironolactone (ALDACTONE) 25 MG tablet Take once daily 90 tablet 3   No current facility-administered medications for this visit.     Allergies  Allergen Reactions  . Horse-Derived  Products     Redness & swelling of arm  . Other Anaphylaxis    Tree nuts (not peanuts)   . Penicillins     Rash   . Shellfish-Derived Products     Throat swelling  . Bacitracin-Polymyxin B     Local swelling  . Eggs Or Egg-Derived Products     REACTION: Unable to get Flu Vaccine Skin Tests+ for egg products  . Neomycin-Bacitracin Zn-Polymyx     Review of Systems:  Negative except for HPI.     Physical Exam:    BP 122/72   Pulse 62   Ht 5' 4.5" (1.638 m)   Wt 156 lb (70.8 kg)   SpO2 97%   BMI 26.36 kg/m  Filed Weights   08/06/18 1457  Weight: 156 lb (70.8 kg)   Constitutional:  Well-developed, in no acute distress. Psychiatric: Normal mood and affect. Behavior is normal. HEENT: Pupils normal.  Conjunctivae are normal. No scleral icterus. Cardiovascular: Normal rate, regular rhythm. No edema Pulmonary/chest: Effort normal and breath sounds normal. No wheezing, rales or rhonchi. Abdominal: Soft, nondistended. Nontender. Bowel sounds active throughout. There are no masses palpable. No hepatomegaly. Neurological: Alert and oriented to person place and time. Skin: Skin is warm and dry. No rashes noted.  Data Reviewed: I have personally reviewed following labs and imaging studies  CBC: CBC Latest Ref Rng & Units 04/04/2018 03/14/2017 06/04/2015  WBC 4.0 - 10.5 K/uL 6.4 5.8 5.4  Hemoglobin 12.0 - 15.0 g/dL 13.5 13.2 13.0  Hematocrit 36.0 - 46.0 % 38.8 37.7 38.2  Platelets 150.0 - 400.0 K/uL 327.0 239.0 244.0    CMP: CMP Latest Ref Rng & Units 04/04/2018 03/14/2017 06/04/2015  Glucose 70 - 99 mg/dL 98 104(H) 98  BUN 6 - 23 mg/dL 9 12 10   Creatinine 0.40 - 1.20 mg/dL 0.62 0.58 0.60  Sodium 135 - 145 mEq/L 132(L) 136 133(L)  Potassium 3.5 - 5.1 mEq/L 4.6 4.1 4.4  Chloride 96 - 112 mEq/L 95(L) 100 97  CO2 19 - 32 mEq/L 30 30 29   Calcium 8.4 - 10.5 mg/dL 9.4 9.3 9.3  Total Protein 6.0 - 8.3 g/dL 6.5 6.4 6.5  Total Bilirubin 0.2 - 1.2 mg/dL 0.5 0.5 0.7  Alkaline Phos 39  - 117 U/L 58 53 53  AST 0 - 37 U/L 18 18 18   ALT 0 - 35 U/L 11 9 9      Carmell Austria, MD 08/06/2018, 3:11 PM  Cc: [name], MD

## 2018-08-06 NOTE — Patient Instructions (Signed)
If you are age 81 or older, your body mass index should be between 23-30. Your Body mass index is 26.36 kg/m. If this is out of the aforementioned range listed, please consider follow up with your Primary Care Provider.  If you are age 67 or younger, your body mass index should be between 19-25. Your Body mass index is 26.36 kg/m. If this is out of the aformentioned range listed, please consider follow up with your Primary Care Provider.   You have been scheduled for a Barium Esophogram at [facility] (1st floor of the hospital) on 08/09/18 at 10:30am. Please arrive 15 minutes prior to your appointment for registration. Make certain not to have anything to eat or drink 3 hours prior to your test. If you need to reschedule for any reason, please contact radiology at [phone] to do so. __________________________________________________________________ A barium swallow is an examination that concentrates on views of the esophagus. This tends to be a double contrast exam (barium and two liquids which, when combined, create a gas to distend the wall of the oesophagus) or single contrast (non-ionic iodine based). The study is usually tailored to your symptoms so a good history is essential. Attention is paid during the study to the form, structure and configuration of the esophagus, looking for functional disorders (such as aspiration, dysphagia, achalasia, motility and reflux) EXAMINATION You may be asked to change into a gown, depending on the type of swallow being performed. A radiologist and radiographer will perform the procedure. The radiologist will advise you of the type of contrast selected for your procedure and direct you during the exam. You will be asked to stand, sit or lie in several different positions and to hold a small amount of fluid in your mouth before being asked to swallow while the imaging is performed .In some instances you may be asked to swallow barium coated  marshmallows to assess the motility of a solid food bolus. The exam can be recorded as a digital or video fluoroscopy procedure. POST PROCEDURE It will take 1-2 days for the barium to pass through your system. To facilitate this, it is important, unless otherwise directed, to increase your fluids for the next 24-48hrs and to resume your normal diet.  This test typically takes about 30 minutes to perform. __________________________________________________________________________________  [name] [name] have been scheduled for an endoscopy. Please follow written instructions given to you at your visit today. If you use inhalers (even only as needed), please bring them with you on the day of your procedure. Your physician has requested that you go to www.startemmi.com and enter the access code given to you at your visit today. This web site gives a general overview about your procedure. However, you should still follow specific instructions given to you by our office regarding your preparation for the procedure.   We have sent the following medications to your pharmacy for you to pick up at your convenience: Omeprazole 20 mg once daily  Thank you,  Dr. Jackquline Denmark

## 2018-08-09 ENCOUNTER — Ambulatory Visit (HOSPITAL_COMMUNITY)
Admission: RE | Admit: 2018-08-09 | Discharge: 2018-08-09 | Disposition: A | Discharge status: Home / Self Care | Payer: Medicare Other | Source: Ambulatory Visit | Attending: Gastroenterology | Admitting: Gastroenterology

## 2018-08-09 DIAGNOSIS — K449 Diaphragmatic hernia without obstruction or gangrene: Secondary | ICD-10-CM | POA: Diagnosis not present

## 2018-08-09 DIAGNOSIS — R1310 Dysphagia, unspecified: Secondary | ICD-10-CM | POA: Diagnosis not present

## 2018-09-13 ENCOUNTER — Encounter: Payer: Medicare Other | Admitting: Gastroenterology

## 2018-11-27 ENCOUNTER — Other Ambulatory Visit: Payer: Self-pay | Admitting: Gastroenterology

## 2018-11-27 ENCOUNTER — Other Ambulatory Visit: Payer: Self-pay

## 2019-04-05 ENCOUNTER — Other Ambulatory Visit: Payer: Self-pay

## 2019-04-14 ENCOUNTER — Other Ambulatory Visit: Payer: Self-pay | Admitting: Family Medicine

## 2019-04-14 DIAGNOSIS — I10 Essential (primary) hypertension: Secondary | ICD-10-CM

## 2019-04-19 ENCOUNTER — Other Ambulatory Visit: Payer: Self-pay | Admitting: Family Medicine

## 2019-04-19 DIAGNOSIS — E782 Mixed hyperlipidemia: Secondary | ICD-10-CM

## 2019-05-17 ENCOUNTER — Other Ambulatory Visit: Payer: Self-pay | Admitting: Family Medicine

## 2019-05-17 DIAGNOSIS — I10 Essential (primary) hypertension: Secondary | ICD-10-CM

## 2019-05-18 ENCOUNTER — Other Ambulatory Visit: Payer: Self-pay | Admitting: Family Medicine

## 2019-05-18 DIAGNOSIS — F32A Depression, unspecified: Secondary | ICD-10-CM

## 2019-05-18 DIAGNOSIS — F329 Major depressive disorder, single episode, unspecified: Secondary | ICD-10-CM

## 2019-05-19 ENCOUNTER — Other Ambulatory Visit: Payer: Self-pay | Admitting: Family Medicine

## 2019-05-19 DIAGNOSIS — I10 Essential (primary) hypertension: Secondary | ICD-10-CM

## 2019-05-20 ENCOUNTER — Telehealth: Payer: Self-pay

## 2019-05-20 DIAGNOSIS — M353 Polymyalgia rheumatica: Secondary | ICD-10-CM

## 2019-05-20 DIAGNOSIS — E782 Mixed hyperlipidemia: Secondary | ICD-10-CM

## 2019-05-20 DIAGNOSIS — E890 Postprocedural hypothyroidism: Secondary | ICD-10-CM

## 2019-05-20 NOTE — Telephone Encounter (Signed)
Pt called for refill on her metoprolol tartrate (LOPRESSOR) 100 MG tablet And  levothyroxine (SYNTHROID, LEVOTHROID) 100 MCG tablet  Sent to   [id number] - [place], [place] - [address] AT [place] OF [address] [phone] (Phone) [phone] (Fax)   Pt is without it and needs asap

## 2019-05-20 NOTE — Telephone Encounter (Signed)
Copied from [place] [id number]. Topic: Appointment Scheduling - Scheduling Inquiry for Clinic >> May 20, 2019 11:33 AM Alanda Slim E wrote: Reason for CRM: Pt scheduled a med f/u appt on 10.5.20 and would like orders for blood work put in so she can be scheduled for a lab appt the week before her med f/u. please advise

## 2019-05-22 NOTE — Telephone Encounter (Signed)
Please advise on the labs you would like for me to put to.

## 2019-05-23 NOTE — Telephone Encounter (Signed)
The labs are ordered

## 2019-05-23 NOTE — Telephone Encounter (Signed)
Left message to return phone call.

## 2019-05-24 NOTE — Telephone Encounter (Signed)
Called pt 2x with no answer to schedule lab visit. No answer closing note. Pt can call back when she is ready to schedule

## 2019-05-27 ENCOUNTER — Other Ambulatory Visit: Payer: Self-pay | Admitting: Family Medicine

## 2019-05-27 DIAGNOSIS — F32A Depression, unspecified: Secondary | ICD-10-CM

## 2019-05-27 DIAGNOSIS — F329 Major depressive disorder, single episode, unspecified: Secondary | ICD-10-CM

## 2019-05-27 NOTE — Telephone Encounter (Signed)
Patient need to schedule an ov for more refills. 

## 2019-05-30 ENCOUNTER — Other Ambulatory Visit: Payer: Self-pay

## 2019-05-30 ENCOUNTER — Other Ambulatory Visit (INDEPENDENT_AMBULATORY_CARE_PROVIDER_SITE_OTHER): Payer: Medicare Other

## 2019-05-30 DIAGNOSIS — E782 Mixed hyperlipidemia: Secondary | ICD-10-CM

## 2019-05-30 DIAGNOSIS — E890 Postprocedural hypothyroidism: Secondary | ICD-10-CM | POA: Diagnosis not present

## 2019-05-30 DIAGNOSIS — M353 Polymyalgia rheumatica: Secondary | ICD-10-CM

## 2019-05-30 LAB — BASIC METABOLIC PANEL
BUN: 13 mg/dL (ref 6–23)
CO2: 28 mEq/L (ref 19–32)
Calcium: 9.5 mg/dL (ref 8.4–10.5)
Chloride: 94 mEq/L — ABNORMAL LOW (ref 96–112)
Creatinine, Ser: 0.57 mg/dL (ref 0.40–1.20)
GFR: 101.6 mL/min (ref >60.00)
Glucose, Bld: 108 mg/dL — ABNORMAL HIGH (ref 70–99)
Potassium: 4.4 mEq/L (ref 3.5–5.1)
Sodium: 130 mEq/L — ABNORMAL LOW (ref 135–145)

## 2019-05-30 LAB — CBC WITH DIFFERENTIAL/PLATELET
Basophils Absolute: 0 10*3/uL (ref 0.0–0.1)
Basophils Relative: 0.4 % (ref 0.0–3.0)
Eosinophils Absolute: 0.2 10*3/uL (ref 0.0–0.7)
Eosinophils Relative: 3.3 % (ref 0.0–5.0)
HCT: 38 % (ref 36.0–46.0)
Hemoglobin: 13.3 g/dL (ref 12.0–15.0)
Lymphocytes Relative: 31.4 % (ref 12.0–46.0)
Lymphs Abs: 1.8 10*3/uL (ref 0.7–4.0)
MCHC: 35 g/dL (ref 30.0–36.0)
MCV: 89.8 fl (ref 78.0–100.0)
Monocytes Absolute: 0.5 10*3/uL (ref 0.1–1.0)
Monocytes Relative: 8.6 % (ref 3.0–12.0)
Neutro Abs: 3.3 10*3/uL (ref 1.4–7.7)
Neutrophils Relative %: 56.3 % (ref 43.0–77.0)
Platelets: 261 10*3/uL (ref 150.0–400.0)
RBC: 4.23 Mil/uL (ref 3.87–5.11)
RDW: 12 % (ref 11.5–15.5)
WBC: 5.8 10*3/uL (ref 4.0–10.5)

## 2019-05-30 LAB — T4, FREE: Free T4: 2.15 ng/dL — ABNORMAL HIGH (ref 0.60–1.60)

## 2019-05-30 LAB — HEPATIC FUNCTION PANEL
ALT: 10 U/L (ref 0–35)
AST: 18 U/L (ref 0–37)
Albumin: 4.3 g/dL (ref 3.5–5.2)
Alkaline Phosphatase: 55 U/L (ref 39–117)
Bilirubin, Direct: 0.1 mg/dL (ref 0.0–0.3)
Total Bilirubin: 0.7 mg/dL (ref 0.2–1.2)
Total Protein: 6.5 g/dL (ref 6.0–8.3)

## 2019-05-30 LAB — LIPID PANEL
Cholesterol: 160 mg/dL (ref 0–200)
HDL: 43.2 mg/dL (ref >39.00)
LDL Cholesterol: 93 mg/dL (ref 0–99)
NonHDL: 116.55
Total CHOL/HDL Ratio: 4
Triglycerides: 118 mg/dL (ref 0.0–149.0)
VLDL: 23.6 mg/dL (ref 0.0–40.0)

## 2019-05-30 LAB — T3, FREE: T3, Free: 3 pg/mL (ref 2.3–4.2)

## 2019-05-30 LAB — SEDIMENTATION RATE: Sed Rate: 11 mm/hr (ref 0–30)

## 2019-05-30 LAB — C-REACTIVE PROTEIN: CRP: 1 mg/dL (ref 0.5–20.0)

## 2019-05-30 LAB — TSH: TSH: 0.01 u[IU]/mL — ABNORMAL LOW (ref 0.35–4.50)

## 2019-05-31 ENCOUNTER — Other Ambulatory Visit: Payer: Self-pay

## 2019-05-31 MED ORDER — LEVOTHYROXINE SODIUM 88 MCG PO TABS: 88.0000 ug | ORAL_TABLET | Freq: Every day | ORAL | 3 refills | Status: DC

## 2019-06-10 ENCOUNTER — Encounter: Payer: Self-pay | Admitting: Family Medicine

## 2019-06-10 ENCOUNTER — Ambulatory Visit (INDEPENDENT_AMBULATORY_CARE_PROVIDER_SITE_OTHER): Payer: Medicare Other | Admitting: Family Medicine

## 2019-06-10 ENCOUNTER — Other Ambulatory Visit: Payer: Self-pay

## 2019-06-10 VITALS — BP 130/70 | HR 71 | Temp 98.1°F | Ht 64.5 in | Wt 158.4 lb

## 2019-06-10 DIAGNOSIS — I10 Essential (primary) hypertension: Secondary | ICD-10-CM

## 2019-06-10 DIAGNOSIS — E782 Mixed hyperlipidemia: Secondary | ICD-10-CM

## 2019-06-10 DIAGNOSIS — Z23 Encounter for immunization: Secondary | ICD-10-CM

## 2019-06-10 DIAGNOSIS — Z85850 Personal history of malignant neoplasm of thyroid: Secondary | ICD-10-CM

## 2019-06-10 DIAGNOSIS — E890 Postprocedural hypothyroidism: Secondary | ICD-10-CM

## 2019-06-10 NOTE — Progress Notes (Signed)
   Subjective:    Patient ID: [name] [name], female    DOB: Apr 20, 1935, 84 y.o.   MRN: [id number]  HPI Here to follow up on HTN and to go over lab results from 05-30-19. She feels fine. Her BP at home is stable in the 110s over 70s. Her free T4 was elevated so we reduced her Synthroid dose to 88 mcg daily. Her lipid levels were all in range.   Review of Systems  Constitutional: Negative.   Respiratory: Negative.   Cardiovascular: Negative.        Objective:   Physical Exam Constitutional:      Appearance: Normal appearance.  Cardiovascular:     Rate and Rhythm: Normal rate and regular rhythm.     Pulses: Normal pulses.     Heart sounds: Normal heart sounds.  Pulmonary:     Effort: Pulmonary effort is normal.     Breath sounds: Normal breath sounds.  Neurological:     Mental Status: She is alert.           Assessment & Plan:  She is doing well. We will plan to repeat a thyroid panel in January. HTN and lipids are controlled.  Alysia Penna, MD

## 2019-06-15 ENCOUNTER — Other Ambulatory Visit: Payer: Self-pay | Admitting: Family Medicine

## 2019-06-15 DIAGNOSIS — F329 Major depressive disorder, single episode, unspecified: Secondary | ICD-10-CM

## 2019-06-15 DIAGNOSIS — F32A Depression, unspecified: Secondary | ICD-10-CM

## 2019-08-11 ENCOUNTER — Other Ambulatory Visit: Payer: Self-pay | Admitting: Family Medicine

## 2019-08-11 DIAGNOSIS — I10 Essential (primary) hypertension: Secondary | ICD-10-CM

## 2019-08-28 ENCOUNTER — Other Ambulatory Visit: Payer: Self-pay | Admitting: Gastroenterology

## 2019-09-25 ENCOUNTER — Ambulatory Visit: Payer: Medicare Other | Attending: Internal Medicine

## 2019-09-25 DIAGNOSIS — Z23 Encounter for immunization: Secondary | ICD-10-CM

## 2019-09-25 NOTE — Progress Notes (Signed)
   Covid-19 Vaccination Clinic  Name:  [name] [name]    MRN: [id number] DOB: 1935-06-21  09/25/2019  [name] [name] was observed post Covid-19 immunization for 15 minutes without incidence. She was provided with Vaccine Information Sheet and instruction to access the V-Safe system.   [name] [name] was instructed to call 911 with any severe reactions post vaccine: Marland Kitchen Difficulty breathing  . Swelling of your face and throat  . A fast heartbeat  . A bad rash all over your body  . Dizziness and weakness    Immunizations Administered    Name Date Dose VIS Date Route   Pfizer COVID-19 Vaccine 09/25/2019  4:43 PM 0.3 mL 08/16/2019 Intramuscular   Manufacturer: [place]   Lot: [id number]   [place]: [id number]

## 2019-09-30 ENCOUNTER — Other Ambulatory Visit: Payer: Self-pay | Admitting: Gastroenterology

## 2019-10-16 ENCOUNTER — Ambulatory Visit: Payer: Medicare Other | Attending: Internal Medicine

## 2019-10-16 DIAGNOSIS — Z23 Encounter for immunization: Secondary | ICD-10-CM | POA: Insufficient documentation

## 2019-10-16 NOTE — Progress Notes (Signed)
   Covid-19 Vaccination Clinic  Name:  [name] [name]    MRN: [id number] DOB: 07-09-1935  10/16/2019  [name] was observed post Covid-19 immunization for 15 minutes without incidence. She was provided with Vaccine Information Sheet and instruction to access the V-Safe system.   [name] was instructed to call 911 with any severe reactions post vaccine: Marland Kitchen Difficulty breathing  . Swelling of your face and throat  . A fast heartbeat  . A bad rash all over your body  . Dizziness and weakness    Immunizations Administered    Name Date Dose VIS Date Route   Pfizer COVID-19 Vaccine 10/16/2019  8:08 AM 0.3 mL 08/16/2019 Intramuscular   Manufacturer: [place]   Lot: [id number]   [place]: [id number]

## 2019-11-12 ENCOUNTER — Other Ambulatory Visit: Payer: Self-pay | Admitting: Family Medicine

## 2019-11-12 DIAGNOSIS — I10 Essential (primary) hypertension: Secondary | ICD-10-CM

## 2019-12-26 ENCOUNTER — Other Ambulatory Visit: Payer: Medicare Other

## 2020-01-02 ENCOUNTER — Other Ambulatory Visit (INDEPENDENT_AMBULATORY_CARE_PROVIDER_SITE_OTHER): Payer: Medicare Other

## 2020-01-02 DIAGNOSIS — E890 Postprocedural hypothyroidism: Secondary | ICD-10-CM

## 2020-01-02 LAB — TSH: TSH: 0.21 u[IU]/mL — ABNORMAL LOW (ref 0.35–4.50)

## 2020-01-02 LAB — T3, FREE: T3, Free: 3.2 pg/mL (ref 2.3–4.2)

## 2020-01-02 LAB — T4, FREE: Free T4: 1.13 ng/dL (ref 0.60–1.60)

## 2020-01-03 ENCOUNTER — Telehealth: Payer: Self-pay | Admitting: Family Medicine

## 2020-01-03 NOTE — Telephone Encounter (Signed)
error 

## 2020-01-12 ENCOUNTER — Other Ambulatory Visit: Payer: Self-pay | Admitting: Family Medicine

## 2020-01-12 DIAGNOSIS — I10 Essential (primary) hypertension: Secondary | ICD-10-CM

## 2020-04-15 ENCOUNTER — Other Ambulatory Visit: Payer: Self-pay | Admitting: Family Medicine

## 2020-04-15 DIAGNOSIS — I10 Essential (primary) hypertension: Secondary | ICD-10-CM

## 2020-04-28 ENCOUNTER — Other Ambulatory Visit: Payer: Self-pay | Admitting: Family Medicine

## 2020-04-28 DIAGNOSIS — E782 Mixed hyperlipidemia: Secondary | ICD-10-CM

## 2020-04-29 ENCOUNTER — Other Ambulatory Visit: Payer: Self-pay | Admitting: Family Medicine

## 2020-04-29 DIAGNOSIS — E782 Mixed hyperlipidemia: Secondary | ICD-10-CM

## 2020-05-09 ENCOUNTER — Other Ambulatory Visit: Payer: Self-pay | Admitting: Family Medicine

## 2020-05-09 DIAGNOSIS — I10 Essential (primary) hypertension: Secondary | ICD-10-CM

## 2020-05-12 ENCOUNTER — Telehealth: Payer: Self-pay | Admitting: Family Medicine

## 2020-05-12 NOTE — Telephone Encounter (Signed)
Rx has already been sent in this morning.  Spoke with the patient. She is aware that her refills have been sent in and has scheduled an appointment.

## 2020-05-12 NOTE — Telephone Encounter (Signed)
Pt want to know when her metoprolol tartrate (LOPRESSOR) 100 MG tablet  going to be filled

## 2020-05-28 ENCOUNTER — Other Ambulatory Visit: Payer: Self-pay | Admitting: Family Medicine

## 2020-05-28 DIAGNOSIS — E782 Mixed hyperlipidemia: Secondary | ICD-10-CM

## 2020-06-03 ENCOUNTER — Telehealth: Payer: Self-pay | Admitting: Family Medicine

## 2020-06-03 NOTE — Telephone Encounter (Signed)
Left message for patient to schedule Annual Wellness Visit.  Please schedule with Nurse Health Advisor Shannon Crews, RN at [facility]  

## 2020-06-11 ENCOUNTER — Ambulatory Visit (INDEPENDENT_AMBULATORY_CARE_PROVIDER_SITE_OTHER): Payer: Medicare Other

## 2020-06-11 DIAGNOSIS — Z0000 Encounter for general adult medical examination without abnormal findings: Secondary | ICD-10-CM | POA: Diagnosis not present

## 2020-06-11 NOTE — Patient Instructions (Signed)
[name] [name] , Thank you for taking time to come for your Medicare Wellness Visit. I appreciate your ongoing commitment to your health goals. Please review the following plan we discussed and let me know if I can assist you in the future.   Screening recommendations/referrals: Colonoscopy: No longer required Mammogram: No longer required  Bone Density: No longer required Recommended yearly ophthalmology/optometry visit for glaucoma screening and checkup Recommended yearly dental visit for hygiene and checkup  Vaccinations: Influenza vaccine: Not required as patient has an allergy Pneumococcal vaccine: Completed series Tdap vaccine: Not required as patient has an allergy Shingles vaccine: Patient declined to receive this vaccine     Advanced directives: Please bring copies of your medical advanced directives so that we may scan them into your chart   Conditions/risks identified: None   Next appointment: 06/15/2020 @ 10:30 am with Dr. Sarajane Jews    Preventive Care 65 Years and Older, Female Preventive care refers to lifestyle choices and visits with your health care provider that can promote health and wellness. What does preventive care include?  A yearly physical exam. This is also called an annual well check.  Dental exams once or twice a year.  Routine eye exams. Ask your health care provider how often you should have your eyes checked.  Personal lifestyle choices, including:  Daily care of your teeth and gums.  Regular physical activity.  Eating a healthy diet.  Avoiding tobacco and drug use.  Limiting alcohol use.  Practicing safe sex.  Taking low-dose aspirin every day.  Taking vitamin and mineral supplements as recommended by your health care provider. What happens during an annual well check? The services and screenings done by your health care provider during your annual well check will depend on your age, overall health, lifestyle risk factors, and family history  of disease. Counseling  Your health care provider may ask you questions about your:  Alcohol use.  Tobacco use.  Drug use.  Emotional well-being.  Home and relationship well-being.  Sexual activity.  Eating habits.  History of falls.  Memory and ability to understand (cognition).  Work and work Statistician.  Reproductive health. Screening  You may have the following tests or measurements:  Height, weight, and BMI.  Blood pressure.  Lipid and cholesterol levels. These may be checked every 5 years, or more frequently if you are over 70 years old.  Skin check.  Lung cancer screening. You may have this screening every year starting at age 10 if you have a 30-pack-year history of smoking and currently smoke or have quit within the past 15 years.  Fecal occult blood test (FOBT) of the stool. You may have this test every year starting at age 72.  Flexible sigmoidoscopy or colonoscopy. You may have a sigmoidoscopy every 5 years or a colonoscopy every 10 years starting at age 93.  Hepatitis C blood test.  Hepatitis B blood test.  Sexually transmitted disease (STD) testing.  Diabetes screening. This is done by checking your blood sugar (glucose) after you have not eaten for a while (fasting). You may have this done every 1-3 years.  Bone density scan. This is done to screen for osteoporosis. You may have this done starting at age 88.  Mammogram. This may be done every 1-2 years. Talk to your health care provider about how often you should have regular mammograms. Talk with your health care provider about your test results, treatment options, and if necessary, the need for more tests. Vaccines  Your health  care provider may recommend certain vaccines, such as:  Influenza vaccine. This is recommended every year.  Tetanus, diphtheria, and acellular pertussis (Tdap, Td) vaccine. You may need a Td booster every 10 years.  Zoster vaccine. You may need this after age  81.  Pneumococcal 13-valent conjugate (PCV13) vaccine. One dose is recommended after age 1.  Pneumococcal polysaccharide (PPSV23) vaccine. One dose is recommended after age 45. Talk to your health care provider about which screenings and vaccines you need and how often you need them. This information is not intended to replace advice given to you by your health care provider. Make sure you discuss any questions you have with your health care provider. Document Released: 09/18/2015 Document Revised: 05/11/2016 Document Reviewed: 06/23/2015 Elsevier Interactive Patient Education  2017 [place] Prevention in the Home Falls can cause injuries. They can happen to people of all ages. There are many things you can do to make your home safe and to help prevent falls. What can I do on the outside of my home?  Regularly fix the edges of walkways and driveways and fix any cracks.  Remove anything that might make you trip as you walk through a door, such as a raised step or threshold.  Trim any bushes or trees on the path to your home.  Use bright outdoor lighting.  Clear any walking paths of anything that might make someone trip, such as rocks or tools.  Regularly check to see if handrails are loose or broken. Make sure that both sides of any steps have handrails.  Any raised decks and porches should have guardrails on the edges.  Have any leaves, snow, or ice cleared regularly.  Use sand or salt on walking paths during winter.  Clean up any spills in your garage right away. This includes oil or grease spills. What can I do in the bathroom?  Use night lights.  Install grab bars by the toilet and in the tub and shower. Do not use towel bars as grab bars.  Use non-skid mats or decals in the tub or shower.  If you need to sit down in the shower, use a plastic, non-slip stool.  Keep the floor dry. Clean up any water that spills on the floor as soon as it happens.  Remove  soap buildup in the tub or shower regularly.  Attach bath mats securely with double-sided non-slip rug tape.  Do not have throw rugs and other things on the floor that can make you trip. What can I do in the bedroom?  Use night lights.  Make sure that you have a light by your bed that is easy to reach.  Do not use any sheets or blankets that are too big for your bed. They should not hang down onto the floor.  Have a firm chair that has side arms. You can use this for support while you get dressed.  Do not have throw rugs and other things on the floor that can make you trip. What can I do in the kitchen?  Clean up any spills right away.  Avoid walking on wet floors.  Keep items that you use a lot in easy-to-reach places.  If you need to reach something above you, use a strong step stool that has a grab bar.  Keep electrical cords out of the way.  Do not use floor polish or wax that makes floors slippery. If you must use wax, use non-skid floor wax.  Do not have  throw rugs and other things on the floor that can make you trip. What can I do with my stairs?  Do not leave any items on the stairs.  Make sure that there are handrails on both sides of the stairs and use them. Fix handrails that are broken or loose. Make sure that handrails are as long as the stairways.  Check any carpeting to make sure that it is firmly attached to the stairs. Fix any carpet that is loose or worn.  Avoid having throw rugs at the top or bottom of the stairs. If you do have throw rugs, attach them to the floor with carpet tape.  Make sure that you have a light switch at the top of the stairs and the bottom of the stairs. If you do not have them, ask someone to add them for you. What else can I do to help prevent falls?  Wear shoes that:  Do not have high heels.  Have rubber bottoms.  Are comfortable and fit you well.  Are closed at the toe. Do not wear sandals.  If you use a  stepladder:  Make sure that it is fully opened. Do not climb a closed stepladder.  Make sure that both sides of the stepladder are locked into place.  Ask someone to hold it for you, if possible.  Clearly mark and make sure that you can see:  Any grab bars or handrails.  First and last steps.  Where the edge of each step is.  Use tools that help you move around (mobility aids) if they are needed. These include:  Canes.  Walkers.  Scooters.  Crutches.  Turn on the lights when you go into a dark area. Replace any light bulbs as soon as they burn out.  Set up your furniture so you have a clear path. Avoid moving your furniture around.  If any of your floors are uneven, fix them.  If there are any pets around you, be aware of where they are.  Review your medicines with your doctor. Some medicines can make you feel dizzy. This can increase your chance of falling. Ask your doctor what other things that you can do to help prevent falls. This information is not intended to replace advice given to you by your health care provider. Make sure you discuss any questions you have with your health care provider. Document Released: 06/18/2009 Document Revised: 01/28/2016 Document Reviewed: 09/26/2014 Elsevier Interactive Patient Education  2017 [employer].

## 2020-06-11 NOTE — Progress Notes (Signed)
Subjective:   [name] [name] is a 85 y.o. female who presents for Medicare Annual (Subsequent) preventive examination.  I connected with [name] today by telephone and verified that I am speaking with the correct person using two identifiers. Location patient: home Location provider: work Persons participating in the virtual visit: patient, provider.   I discussed the limitations, risks, security and privacy concerns of performing an evaluation and management service by telephone and the availability of in person appointments. I also discussed with the patient that there may be a patient responsible charge related to this service. The patient expressed understanding and verbally consented to this telephonic visit.    Interactive audio and video telecommunications were attempted between this provider and patient, however failed, due to patient having technical difficulties OR patient did not have access to video capability.  We continued and completed visit with audio only.     Review of Systems    N/A  Cardiac Risk Factors include: advanced age (>67men, >101 women);hypertension     Objective:    Today's Vitals   There is no height or weight on file to calculate BMI.  Advanced Directives 06/11/2020 07/21/2018  Does Patient Have a Medical Advance Directive? Yes No  Type of Advance Directive Living will;Healthcare Power of Attorney -  Does patient want to make changes to medical advance directive? No - Patient declined -  Copy of [place] in Chart? Yes - validated most recent copy scanned in chart (See row information) -  Would patient like information on creating a medical advance directive? - No - Patient declined    Current Medications (verified) Outpatient Encounter Medications as of 06/11/2020  Medication Sig  . citalopram (CELEXA) 20 MG tablet TAKE ONE-HALF TABLET BY MOUTH DAILY AS DIRECTED  . diphenhydrAMINE (BENADRYL) 25 mg capsule Take 25 mg  by mouth as needed.    Marland Kitchen levothyroxine (SYNTHROID) 88 MCG tablet TAKE 1 TABLET(88 MCG) BY MOUTH DAILY  . metoprolol tartrate (LOPRESSOR) 100 MG tablet TAKE 1 TABLET BY MOUTH EVERY MORNING AND 1/2 TABLET EVERY EVENING  . omeprazole (PRILOSEC) 20 MG capsule TAKE 1 CAPSULE(20 MG) BY MOUTH DAILY  . pravastatin (PRAVACHOL) 40 MG tablet TAKE 1 TABLET BY MOUTH 1 TIME IN THE EVENING AS DIRECTED  . spironolactone (ALDACTONE) 25 MG tablet TAKE 1 TABLET BY MOUTH DAILY   No facility-administered encounter medications on file as of 06/11/2020.    Allergies (verified) Horse-derived products, Other, Penicillins, Shellfish allergy, Shellfish-derived products, Bacitracin-polymyxin b, Eggs or egg-derived products, and Neomycin-bacitracin zn-polymyx   History: Past Medical History:  Diagnosis Date  . Allergic angioedema    from shellfish and tree nuts  . Chest pain 2000 and 2001   negative cardiac workup  . Complication of anesthesia   . Diverticulosis   . Fibrocystic breast disease   . Hyperlipidemia   . Hypertension   . Irritable bowel syndrome   . Perirectal cyst    ?lymphocoele  . PMR (polymyalgia rheumatica) ([place]) 2011  . PONV (postoperative nausea and vomiting)   . Seborrheic dermatitis   . Squamous cell skin cancer    X1, [facility]  . Thyroid disease    post op hypothyroidism; TSH goal would be sup;pressive   Past Surgical History:  Procedure Laterality Date  . COLONOSCOPY  2004   per Dr. Olevia Perches, clear but incomplete; Barium study added, repeat in 10 yrs   . DILATION AND CURETTAGE OF UTERUS    . ESOPHAGOGASTRODUODENOSCOPY (EGD) WITH PROPOFOL N/A  07/21/2018   Procedure: ESOPHAGOGASTRODUODENOSCOPY (EGD) WITH PROPOFOL;  Surgeon: Jackquline Denmark, MD;  Location: [facility] ENDOSCOPY;  Service: Endoscopy;  Laterality: N/A;  . exporatory surgery for rectal mass  1986   peri rectal cyst  . FOREIGN BODY REMOVAL  07/21/2018   Procedure: FOREIGN BODY REMOVAL;  Surgeon: Jackquline Denmark, MD;  Location: [facility]  ENDOSCOPY;  Service: Endoscopy;;  . THYROIDECTOMY  1988   S/P radiation previously seen by Liliane Bade, MD @ [facility], now followed by Dr. Leonides Schanz  . TONSILLECTOMY  1972   Family History  Problem Relation Age of Onset  . Parkinsonism Brother   . Hypertension Mother   . Prostate cancer Father   . Breast cancer Maternal Grandmother   . Hypertension Maternal Grandmother   . Heart attack Paternal Uncle 62  . Heart attack Paternal Grandfather 23   Social History   Socioeconomic History  . Marital status: Single    Spouse name: Not on file  . Number of children: Not on file  . Years of education: Not on file  . Highest education level: Not on file  Occupational History  . Not on file  Tobacco Use  . Smoking status: Never Smoker  . Smokeless tobacco: Never Used  Substance and Sexual Activity  . Alcohol use: Yes    Alcohol/week: 0.0 standard drinks    Comment: very rarely  . Drug use: No  . Sexual activity: Not on file  Other Topics Concern  . Not on file  Social History Narrative  . Not on file   Social Determinants of Health   Financial Resource Strain: Low Risk   . Difficulty of Paying Living Expenses: Not hard at all  Food Insecurity: No Food Insecurity  . Worried About Charity fundraiser in the Last Year: Never true  . Ran Out of Food in the Last Year: Never true  Transportation Needs: No Transportation Needs  . Lack of Transportation (Medical): No  . Lack of Transportation (Non-Medical): No  Physical Activity: Insufficiently Active  . Days of Exercise per Week: 2 days  . Minutes of Exercise per Session: 20 min  Stress: No Stress Concern Present  . Feeling of Stress : Not at all  Social Connections: Moderately Isolated  . Frequency of Communication with Friends and Family: More than three times a week  . Frequency of Social Gatherings with Friends and Family: Once a week  . Attends Religious Services: More than 4 times per year  . Active Member of Clubs  or Organizations: No  . Attends Archivist Meetings: Never  . Marital Status: Divorced    Tobacco Counseling Counseling given: Not Answered   Clinical Intake:  Pre-visit preparation completed: Yes  Pain : No/denies pain     Nutritional Risks: None Diabetes: No  How often do you need to have someone help you when you read instructions, pamphlets, or other written materials from your doctor or pharmacy?: 1 - Never What is the last grade level you completed in school?: 12th grade  Diabetic?No  Interpreter Needed?: No  Information entered by :: [place] of Daily Living In your present state of health, do you have any difficulty performing the following activities: 06/11/2020  Hearing? N  Vision? N  Difficulty concentrating or making decisions? Y  Comment Has issues with names  Walking or climbing stairs? N  Dressing or bathing? N  Doing errands, shopping? N  Preparing Food and eating ? N  Using the Toilet? N  In the past six months, have you accidently leaked urine? N  Do you have problems with loss of bowel control? N  Managing your Medications? N  Managing your Finances? N  Housekeeping or managing your Housekeeping? N  Some recent data might be hidden    Patient Care Team: [name], MD as PCP - General (Family Medicine)  Indicate any recent Medical Services you may have received from other than [facility] providers in the past year (date may be approximate).     Assessment:   This is a routine wellness examination for [place].  Hearing/Vision screen  Hearing Screening   125Hz  250Hz  500Hz  1000Hz  2000Hz  3000Hz  4000Hz  6000Hz  8000Hz   Right ear:           Left ear:           Vision Screening Comments: Patient states has not had eyes checked in a few years   Dietary issues and exercise activities discussed: Current Exercise Habits: Home exercise routine, Type of exercise: walking, Time (Minutes): 20, Frequency (Times/Week): 2, Weekly  Exercise (Minutes/Week): 40, Intensity: Mild  Goals    . Exercise 3x per week (30 min per time)      Depression Screen PHQ 2/9 Scores 06/11/2020 06/08/2015 04/24/2014  PHQ - 2 Score 0 0 0  PHQ- 9 Score 0 - -    Fall Risk Fall Risk  06/11/2020 04/05/2019 06/08/2015 04/24/2014  Falls in the past year? 0 (No Data) No No  Comment - Emmi Telephone Survey: data to providers prior to load - -  Number falls in past yr: 0 (No Data) - -  Comment - Emmi Telephone Survey Actual Response =  - -  Injury with Fall? 0 - - -  Risk for fall due to : No Fall Risks - - -  Follow up Falls evaluation completed;Falls prevention discussed - - -    Any stairs in or around the home? No  If so, are there any without handrails? No  Home free of loose throw rugs in walkways, pet beds, electrical cords, etc? Yes  Adequate lighting in your home to reduce risk of falls? Yes   ASSISTIVE DEVICES UTILIZED TO PREVENT FALLS:  Life alert? No  Use of a cane, walker or w/c? No  Grab bars in the bathroom? Yes  Shower chair or bench in shower? No  Elevated toilet seat or a handicapped toilet? No    Cognitive Function:     6CIT Screen 06/11/2020  What Year? 0 points  What month? 0 points  What time? 0 points  Count back from 20 0 points  Months in reverse 0 points  Repeat phrase 0 points  Total Score 0    Immunizations Immunization History  Administered Date(s) Administered  . PFIZER SARS-COV-2 Vaccination 09/25/2019, 10/16/2019  . Pneumococcal Conjugate-13 04/24/2014  . Pneumococcal Polysaccharide-23 07/14/2010    TDAP status: Due, Education has been provided regarding the importance of this vaccine. Advised may receive this vaccine at local pharmacy or Health Dept. Aware to provide a copy of the vaccination record if obtained from local pharmacy or Health Dept. Verbalized acceptance and understanding. Flu Vaccine status: Declined, Education has been provided regarding the importance of this vaccine but  patient still declined. Advised may receive this vaccine at local pharmacy or Health Dept. Aware to provide a copy of the vaccination record if obtained from local pharmacy or Health Dept. Verbalized acceptance and understanding. Pneumococcal vaccine status: Up to date Covid-19 vaccine status: Completed vaccines  Qualifies for Shingles Vaccine? Yes   Zostavax completed No   Shingrix Completed?: No.    Education has been provided regarding the importance of this vaccine. Patient has been advised to call insurance company to determine out of pocket expense if they have not yet received this vaccine. Advised may also receive vaccine at local pharmacy or Health Dept. Verbalized acceptance and understanding.  Screening Tests Health Maintenance  Topic Date Due  . TETANUS/TDAP  Never done  . INFLUENZA VACCINE  Never done  . DEXA SCAN  Completed  . COVID-19 Vaccine  Completed  . PNA vac Low Risk Adult  Completed    Health Maintenance  Health Maintenance Due  Topic Date Due  . TETANUS/TDAP  Never done  . INFLUENZA VACCINE  Never done    Colorectal cancer screening: No longer required.  Mammogram status: No longer required.  Bone Density status: Completed 07/23/2010. Results reflect: Bone density results: NORMAL. Repeat every 0 years.  Lung Cancer Screening: (Low Dose CT Chest recommended if Age 4-80 years, 30 pack-year currently smoking OR have quit w/in 15years.) does not qualify.   Lung Cancer Screening Referral: N/A  Additional Screening:  Hepatitis C Screening: does not qualify;   Vision Screening: Recommended annual ophthalmology exams for early detection of glaucoma and other disorders of the eye. Is the patient up to date with their annual eye exam?  No  Who is the provider or what is the name of the office in which the patient attends annual eye exams? Does not have a current eye doctor If pt is not established with a provider, would they like to be referred to a provider to  establish care? Yes .   Dental Screening: Recommended annual dental exams for proper oral hygiene  Community Resource Referral / Chronic Care Management: CRR required this visit?  No   CCM required this visit?  No      Plan:     I have personally reviewed and noted the following in the patient's chart:   . Medical and social history . Use of alcohol, tobacco or illicit drugs  . Current medications and supplements . Functional ability and status . Nutritional status . Physical activity . Advanced directives . List of other physicians . Hospitalizations, surgeries, and ER visits in previous 12 months . Vitals . Screenings to include cognitive, depression, and falls . Referrals and appointments  In addition, I have reviewed and discussed with patient certain preventive protocols, quality metrics, and best practice recommendations. A written personalized care plan for preventive services as well as general preventive health recommendations were provided to patient.     Ofilia Neas, LPN   [id number]   Nurse Notes: None

## 2020-06-12 ENCOUNTER — Other Ambulatory Visit: Payer: Self-pay

## 2020-06-15 ENCOUNTER — Ambulatory Visit (INDEPENDENT_AMBULATORY_CARE_PROVIDER_SITE_OTHER): Payer: Medicare Other | Admitting: Family Medicine

## 2020-06-15 ENCOUNTER — Other Ambulatory Visit: Payer: Self-pay

## 2020-06-15 ENCOUNTER — Encounter: Payer: Self-pay | Admitting: Family Medicine

## 2020-06-15 VITALS — BP 110/62 | HR 60 | Temp 98.1°F | Ht 64.5 in | Wt 154.2 lb

## 2020-06-15 DIAGNOSIS — F320 Major depressive disorder, single episode, mild: Secondary | ICD-10-CM

## 2020-06-15 DIAGNOSIS — E782 Mixed hyperlipidemia: Secondary | ICD-10-CM

## 2020-06-15 DIAGNOSIS — E890 Postprocedural hypothyroidism: Secondary | ICD-10-CM

## 2020-06-15 DIAGNOSIS — I10 Essential (primary) hypertension: Secondary | ICD-10-CM | POA: Diagnosis not present

## 2020-06-15 DIAGNOSIS — M353 Polymyalgia rheumatica: Secondary | ICD-10-CM

## 2020-06-15 MED ORDER — CITALOPRAM HYDROBROMIDE 20 MG PO TABS: ORAL_TABLET | ORAL | 3 refills | Status: DC

## 2020-06-15 MED ORDER — EPINEPHRINE 0.3 MG/0.3ML IJ SOAJ: 0.3000 mg | INTRAMUSCULAR | 5 refills | Status: DC | PRN

## 2020-06-15 MED ORDER — SPIRONOLACTONE 25 MG PO TABS: 25.0000 mg | ORAL_TABLET | Freq: Every day | ORAL | 3 refills | Status: DC

## 2020-06-15 MED ORDER — OMEPRAZOLE 20 MG PO CPDR
DELAYED_RELEASE_CAPSULE | ORAL | 3 refills | Status: DC
Start: 2020-06-15 — End: 2021-06-18

## 2020-06-15 MED ORDER — PRAVASTATIN SODIUM 40 MG PO TABS: 40.0000 mg | ORAL_TABLET | Freq: Every day | ORAL | 3 refills | Status: DC

## 2020-06-15 MED ORDER — LEVOTHYROXINE SODIUM 88 MCG PO TABS
ORAL_TABLET | ORAL | 3 refills | Status: DC
Start: 2020-06-15 — End: 2021-06-18

## 2020-06-15 MED ORDER — METOPROLOL TARTRATE 100 MG PO TABS: ORAL_TABLET | ORAL | 3 refills | Status: DC

## 2020-06-15 NOTE — Progress Notes (Signed)
   Subjective:    Patient ID: [name] [name], female    DOB: November 08, 1934, 85 y.o.   MRN: [id number]  HPI Here to follow up on issues. She feels well. Her BP is stable. Her moods are stable.    Review of Systems  Constitutional: Negative.   HENT: Negative.   Eyes: Negative.   Respiratory: Negative.   Cardiovascular: Negative.   Gastrointestinal: Negative.   Genitourinary: Negative for decreased urine volume, difficulty urinating, dyspareunia, dysuria, enuresis, flank pain, frequency, hematuria, pelvic pain and urgency.  Musculoskeletal: Negative.   Skin: Negative.   Neurological: Negative.   Psychiatric/Behavioral: Negative.        Objective:   Physical Exam Constitutional:      General: She is not in acute distress.    Appearance: She is well-developed.  HENT:     Head: Normocephalic and atraumatic.     Right Ear: External ear normal.     Left Ear: External ear normal.     Nose: Nose normal.     Mouth/Throat:     Pharynx: No oropharyngeal exudate.  Eyes:     General: No scleral icterus.    Conjunctiva/sclera: Conjunctivae normal.     Pupils: Pupils are equal, round, and reactive to light.  Neck:     Thyroid: No thyromegaly.     Vascular: No JVD.  Cardiovascular:     Rate and Rhythm: Normal rate and regular rhythm.     Heart sounds: Normal heart sounds. No murmur heard.  No friction rub. No gallop.   Pulmonary:     Effort: Pulmonary effort is normal. No respiratory distress.     Breath sounds: Normal breath sounds. No wheezing or rales.  Chest:     Chest wall: No tenderness.  Abdominal:     General: Bowel sounds are normal. There is no distension.     Palpations: Abdomen is soft. There is no mass.     Tenderness: There is no abdominal tenderness. There is no guarding or rebound.  Musculoskeletal:        General: No tenderness. Normal range of motion.     Cervical back: Normal range of motion and neck supple.  Lymphadenopathy:     Cervical: No cervical  adenopathy.  Skin:    General: Skin is warm and dry.     Findings: No erythema or rash.  Neurological:     Mental Status: She is alert and oriented to person, place, and time.     Cranial Nerves: No cranial nerve deficit.     Motor: No abnormal muscle tone.     Coordination: Coordination normal.     Deep Tendon Reflexes: Reflexes are normal and symmetric. Reflexes normal.  Psychiatric:        Behavior: Behavior normal.        Thought Content: Thought content normal.        Judgment: Judgment normal.           Assessment & Plan:  She is doing well. Her HTN and depression are stable. We will get fasting labs to check her lipids and a thyroid panel.  Alysia Penna, MD

## 2020-06-16 LAB — CBC WITH DIFFERENTIAL/PLATELET
Absolute Monocytes: 598 cells/uL (ref 200–950)
Basophils Absolute: 29 cells/uL (ref 0–200)
Basophils Relative: 0.4 %
Eosinophils Absolute: 79 cells/uL (ref 15–500)
Eosinophils Relative: 1.1 %
HCT: 38.9 % (ref 35.0–45.0)
Hemoglobin: 13.3 g/dL (ref 11.7–15.5)
Lymphs Abs: 2059 cells/uL (ref 850–3900)
MCH: 31.3 pg (ref 27.0–33.0)
MCHC: 34.2 g/dL (ref 32.0–36.0)
MCV: 91.5 fL (ref 80.0–100.0)
MPV: 9.6 fL (ref 7.5–12.5)
Monocytes Relative: 8.3 %
Neutro Abs: 4435 cells/uL (ref 1500–7800)
Neutrophils Relative %: 61.6 %
Platelets: 316 10*3/uL (ref 140–400)
RBC: 4.25 10*6/uL (ref 3.80–5.10)
RDW: 11.7 % (ref 11.0–15.0)
Total Lymphocyte: 28.6 %
WBC: 7.2 10*3/uL (ref 3.8–10.8)

## 2020-06-16 LAB — BASIC METABOLIC PANEL
BUN/Creatinine Ratio: 15 (calc) (ref 6–22)
BUN: 8 mg/dL (ref 7–25)
CO2: 31 mmol/L (ref 20–32)
Calcium: 9.4 mg/dL (ref 8.6–10.4)
Chloride: 94 mmol/L — ABNORMAL LOW (ref 98–110)
Creat: 0.55 mg/dL — ABNORMAL LOW (ref 0.60–0.88)
Glucose, Bld: 100 mg/dL — ABNORMAL HIGH (ref 65–99)
Potassium: 4.5 mmol/L (ref 3.5–5.3)
Sodium: 131 mmol/L — ABNORMAL LOW (ref 135–146)

## 2020-06-16 LAB — LIPID PANEL
Cholesterol: 143 mg/dL (ref <200)
HDL: 40 mg/dL — ABNORMAL LOW (ref >=50)
LDL Cholesterol (Calc): 82 mg/dL (calc)
Non-HDL Cholesterol (Calc): 103 mg/dL (calc) (ref <130)
Total CHOL/HDL Ratio: 3.6 (calc) (ref <5.0)
Triglycerides: 110 mg/dL (ref <150)

## 2020-06-16 LAB — HEPATIC FUNCTION PANEL
AG Ratio: 1.8 (calc) (ref 1.0–2.5)
ALT: 9 U/L (ref 6–29)
AST: 19 U/L (ref 10–35)
Albumin: 4.2 g/dL (ref 3.6–5.1)
Alkaline phosphatase (APISO): 56 U/L (ref 37–153)
Bilirubin, Direct: 0.1 mg/dL (ref 0.0–0.2)
Globulin: 2.4 g/dL (calc) (ref 1.9–3.7)
Indirect Bilirubin: 0.5 mg/dL (calc) (ref 0.2–1.2)
Total Bilirubin: 0.6 mg/dL (ref 0.2–1.2)
Total Protein: 6.6 g/dL (ref 6.1–8.1)

## 2020-06-16 LAB — T4, FREE: Free T4: 1.8 ng/dL (ref 0.8–1.8)

## 2020-06-16 LAB — T3, FREE: T3, Free: 3.2 pg/mL (ref 2.3–4.2)

## 2020-06-16 LAB — TSH: TSH: 0.22 mIU/L — ABNORMAL LOW (ref 0.40–4.50)

## 2020-06-17 ENCOUNTER — Telehealth: Payer: Self-pay

## 2020-06-17 DIAGNOSIS — M810 Age-related osteoporosis without current pathological fracture: Secondary | ICD-10-CM

## 2020-06-17 NOTE — Telephone Encounter (Signed)
Patient says she went to pick up Omeprazole and read the pamphlet. It said it can affect the bones. She wants to know what is Dr. Barbie Banner advice on continuing to take this and if she is due for a bone scan. I advised she's been on this medication since 2019, she says she never read the pamphlet that comes with it until now.

## 2020-06-18 NOTE — Telephone Encounter (Signed)
Patient called, left VM to return the call to the office. 

## 2020-06-18 NOTE — Telephone Encounter (Signed)
Yes she should continue to take this medication. The bone effects are rare, but I have ordered another DEXA to make sure (her last one was in 2011)

## 2020-06-18 NOTE — Addendum Note (Signed)
Addended by: Alysia Penna A on: 06/18/2020 07:50 AM   Modules accepted: Orders

## 2020-06-22 ENCOUNTER — Other Ambulatory Visit: Payer: Self-pay

## 2020-06-22 DIAGNOSIS — I10 Essential (primary) hypertension: Secondary | ICD-10-CM

## 2020-06-22 NOTE — Telephone Encounter (Signed)
Patient called, left VM to return the call to the office. 

## 2020-06-23 LAB — URINALYSIS
Bilirubin Urine: NEGATIVE
Glucose, UA: NEGATIVE
Ketones, ur: NEGATIVE
Nitrite: NEGATIVE
Protein, ur: NEGATIVE
Specific Gravity, Urine: 1.004 (ref 1.001–1.03)
pH: 7.5 (ref 5.0–8.0)

## 2020-06-23 MED ORDER — NITROFURANTOIN MONOHYD MACRO 100 MG PO CAPS: 100.0000 mg | ORAL_CAPSULE | Freq: Two times a day (BID) | ORAL | 0 refills | Status: DC

## 2020-06-23 NOTE — Telephone Encounter (Signed)
Patient called and advised of Dr. Barbie Banner recommendation below, she verbalized understanding.

## 2020-06-23 NOTE — Addendum Note (Signed)
Addended by: Matilde Sprang on: 06/23/2020 02:39 PM   Modules accepted: Orders

## 2020-06-29 ENCOUNTER — Telehealth: Payer: Self-pay | Admitting: Family Medicine

## 2020-06-29 NOTE — Telephone Encounter (Signed)
levothyroxine (SYNTHROID) 88 MCG tablet  pravastatin (PRAVACHOL) 40 MG tablet  [id number] - [place], [place] - [address] AT [place] Phone:  [phone]  Fax:  [phone]     The patient is wanting to know why does she needs Dr. Barbie Banner approval for these 2 Rx when all the other Rx was sent to the pharmacy on 10/11.  The patient would like for you to contact the Pharmacy and then call her to let her know what is going on with just these 2 Rx.

## 2020-06-29 NOTE — Telephone Encounter (Signed)
[place] called and spoke to Bunk Foss, [facility] about the refills. He says they are on file and will be refilled. He says the patient is calling in on the old prescription number with no refills and once the refills run out and a new prescription is sent, that one is placed on hold. He says to let her know she will need to talk to them directly and not the automated. I called the patient and advised, she verbalized understanding.

## 2020-06-30 ENCOUNTER — Other Ambulatory Visit: Payer: Self-pay | Admitting: Family Medicine

## 2020-06-30 DIAGNOSIS — E782 Mixed hyperlipidemia: Secondary | ICD-10-CM

## 2020-07-13 ENCOUNTER — Other Ambulatory Visit: Payer: Self-pay | Admitting: Family Medicine

## 2020-07-13 DIAGNOSIS — F320 Major depressive disorder, single episode, mild: Secondary | ICD-10-CM

## 2020-07-24 DIAGNOSIS — B349 Viral infection, unspecified: Secondary | ICD-10-CM | POA: Diagnosis not present

## 2020-07-27 DIAGNOSIS — J209 Acute bronchitis, unspecified: Secondary | ICD-10-CM | POA: Diagnosis not present

## 2020-07-27 DIAGNOSIS — Z20822 Contact with and (suspected) exposure to covid-19: Secondary | ICD-10-CM | POA: Diagnosis not present

## 2020-08-10 ENCOUNTER — Other Ambulatory Visit: Payer: Self-pay | Admitting: Family Medicine

## 2020-08-10 DIAGNOSIS — I10 Essential (primary) hypertension: Secondary | ICD-10-CM

## 2020-09-11 DIAGNOSIS — Z23 Encounter for immunization: Secondary | ICD-10-CM | POA: Diagnosis not present

## 2020-10-17 ENCOUNTER — Other Ambulatory Visit: Payer: Self-pay | Admitting: Family Medicine

## 2020-10-17 DIAGNOSIS — I10 Essential (primary) hypertension: Secondary | ICD-10-CM

## 2020-10-20 ENCOUNTER — Other Ambulatory Visit: Payer: Self-pay

## 2020-10-20 DIAGNOSIS — I10 Essential (primary) hypertension: Secondary | ICD-10-CM

## 2020-10-20 MED ORDER — SPIRONOLACTONE 25 MG PO TABS: ORAL_TABLET | ORAL | 1 refills | Status: DC

## 2020-12-29 DIAGNOSIS — Z20822 Contact with and (suspected) exposure to covid-19: Secondary | ICD-10-CM | POA: Diagnosis not present

## 2021-01-12 ENCOUNTER — Other Ambulatory Visit: Payer: Self-pay | Admitting: Family Medicine

## 2021-01-12 DIAGNOSIS — I10 Essential (primary) hypertension: Secondary | ICD-10-CM

## 2021-01-14 ENCOUNTER — Telehealth: Payer: Self-pay | Admitting: Family Medicine

## 2021-01-14 NOTE — Telephone Encounter (Signed)
Pharmacy has been updated.

## 2021-01-14 NOTE — Telephone Encounter (Signed)
Pt is calling in stating that she has moved and has a change in her pharmacy  NEW PHARMACY IS [facility] on [address] in [place], [place]

## 2021-01-18 ENCOUNTER — Other Ambulatory Visit: Payer: Self-pay

## 2021-01-19 ENCOUNTER — Ambulatory Visit: Payer: Medicare Other | Admitting: Family Medicine

## 2021-02-16 DIAGNOSIS — Z23 Encounter for immunization: Secondary | ICD-10-CM | POA: Diagnosis not present

## 2021-04-19 ENCOUNTER — Inpatient Hospital Stay (HOSPITAL_COMMUNITY)
Admission: EM | Admit: 2021-04-19 | Discharge: 2021-04-25 | DRG: 418 | Disposition: A | Discharge status: Home / Self Care | Payer: Medicare Other | Attending: Family Medicine | Admitting: Family Medicine

## 2021-04-19 ENCOUNTER — Emergency Department (HOSPITAL_COMMUNITY): Payer: Medicare Other

## 2021-04-19 ENCOUNTER — Other Ambulatory Visit: Payer: Self-pay

## 2021-04-19 ENCOUNTER — Encounter (HOSPITAL_COMMUNITY): Payer: Self-pay | Admitting: Emergency Medicine

## 2021-04-19 DIAGNOSIS — Z820 Family history of epilepsy and other diseases of the nervous system: Secondary | ICD-10-CM

## 2021-04-19 DIAGNOSIS — D649 Anemia, unspecified: Secondary | ICD-10-CM | POA: Diagnosis present

## 2021-04-19 DIAGNOSIS — Z923 Personal history of irradiation: Secondary | ICD-10-CM

## 2021-04-19 DIAGNOSIS — Z803 Family history of malignant neoplasm of breast: Secondary | ICD-10-CM

## 2021-04-19 DIAGNOSIS — Z7982 Long term (current) use of aspirin: Secondary | ICD-10-CM

## 2021-04-19 DIAGNOSIS — K8070 Calculus of gallbladder and bile duct without cholecystitis without obstruction: Secondary | ICD-10-CM | POA: Diagnosis not present

## 2021-04-19 DIAGNOSIS — Z8249 Family history of ischemic heart disease and other diseases of the circulatory system: Secondary | ICD-10-CM

## 2021-04-19 DIAGNOSIS — K449 Diaphragmatic hernia without obstruction or gangrene: Secondary | ICD-10-CM | POA: Diagnosis present

## 2021-04-19 DIAGNOSIS — K2100 Gastro-esophageal reflux disease with esophagitis, without bleeding: Secondary | ICD-10-CM | POA: Diagnosis present

## 2021-04-19 DIAGNOSIS — K8020 Calculus of gallbladder without cholecystitis without obstruction: Secondary | ICD-10-CM

## 2021-04-19 DIAGNOSIS — E7800 Pure hypercholesterolemia, unspecified: Secondary | ICD-10-CM | POA: Diagnosis present

## 2021-04-19 DIAGNOSIS — I119 Hypertensive heart disease without heart failure: Secondary | ICD-10-CM | POA: Diagnosis present

## 2021-04-19 DIAGNOSIS — Z8042 Family history of malignant neoplasm of prostate: Secondary | ICD-10-CM

## 2021-04-19 DIAGNOSIS — R109 Unspecified abdominal pain: Secondary | ICD-10-CM | POA: Diagnosis not present

## 2021-04-19 DIAGNOSIS — M353 Polymyalgia rheumatica: Secondary | ICD-10-CM | POA: Diagnosis present

## 2021-04-19 DIAGNOSIS — E890 Postprocedural hypothyroidism: Secondary | ICD-10-CM | POA: Diagnosis present

## 2021-04-19 DIAGNOSIS — K851 Biliary acute pancreatitis without necrosis or infection: Secondary | ICD-10-CM | POA: Diagnosis not present

## 2021-04-19 DIAGNOSIS — Z79899 Other long term (current) drug therapy: Secondary | ICD-10-CM

## 2021-04-19 DIAGNOSIS — K8050 Calculus of bile duct without cholangitis or cholecystitis without obstruction: Secondary | ICD-10-CM | POA: Diagnosis not present

## 2021-04-19 DIAGNOSIS — K589 Irritable bowel syndrome without diarrhea: Secondary | ICD-10-CM | POA: Diagnosis present

## 2021-04-19 DIAGNOSIS — E871 Hypo-osmolality and hyponatremia: Secondary | ICD-10-CM | POA: Diagnosis not present

## 2021-04-19 DIAGNOSIS — I10 Essential (primary) hypertension: Secondary | ICD-10-CM | POA: Diagnosis not present

## 2021-04-19 DIAGNOSIS — Z880 Allergy status to penicillin: Secondary | ICD-10-CM

## 2021-04-19 DIAGNOSIS — Z85850 Personal history of malignant neoplasm of thyroid: Secondary | ICD-10-CM

## 2021-04-19 DIAGNOSIS — Z20822 Contact with and (suspected) exposure to covid-19: Secondary | ICD-10-CM | POA: Diagnosis present

## 2021-04-19 DIAGNOSIS — Z888 Allergy status to other drugs, medicaments and biological substances status: Secondary | ICD-10-CM

## 2021-04-19 DIAGNOSIS — Z91012 Allergy to eggs: Secondary | ICD-10-CM

## 2021-04-19 DIAGNOSIS — K222 Esophageal obstruction: Secondary | ICD-10-CM | POA: Diagnosis present

## 2021-04-19 DIAGNOSIS — Z91013 Allergy to seafood: Secondary | ICD-10-CM

## 2021-04-19 DIAGNOSIS — E876 Hypokalemia: Secondary | ICD-10-CM | POA: Diagnosis not present

## 2021-04-19 DIAGNOSIS — Z79890 Hormone replacement therapy (postmenopausal): Secondary | ICD-10-CM

## 2021-04-19 DIAGNOSIS — Z85828 Personal history of other malignant neoplasm of skin: Secondary | ICD-10-CM

## 2021-04-19 DIAGNOSIS — I700 Atherosclerosis of aorta: Secondary | ICD-10-CM | POA: Diagnosis present

## 2021-04-19 DIAGNOSIS — K660 Peritoneal adhesions (postprocedural) (postinfection): Secondary | ICD-10-CM | POA: Diagnosis present

## 2021-04-19 DIAGNOSIS — Z881 Allergy status to other antibiotic agents status: Secondary | ICD-10-CM

## 2021-04-19 LAB — CBC
HCT: 41.1 % (ref 36.0–46.0)
Hemoglobin: 14.2 g/dL (ref 12.0–15.0)
MCH: 31.7 pg (ref 26.0–34.0)
MCHC: 34.5 g/dL (ref 30.0–36.0)
MCV: 91.7 fL (ref 80.0–100.0)
Platelets: 272 10*3/uL (ref 150–400)
RBC: 4.48 MIL/uL (ref 3.87–5.11)
RDW: 11.6 % (ref 11.5–15.5)
WBC: 15.3 10*3/uL — ABNORMAL HIGH (ref 4.0–10.5)
nRBC: 0 % (ref 0.0–0.2)

## 2021-04-19 LAB — COMPREHENSIVE METABOLIC PANEL
ALT: 231 U/L — ABNORMAL HIGH (ref 0–44)
AST: 592 U/L — ABNORMAL HIGH (ref 15–41)
Albumin: 4.6 g/dL (ref 3.5–5.0)
Alkaline Phosphatase: 113 U/L (ref 38–126)
Anion gap: 12 (ref 5–15)
BUN: 10 mg/dL (ref 8–23)
CO2: 26 mmol/L (ref 22–32)
Calcium: 9.4 mg/dL (ref 8.9–10.3)
Chloride: 92 mmol/L — ABNORMAL LOW (ref 98–111)
Creatinine, Ser: 0.55 mg/dL (ref 0.44–1.00)
GFR, Estimated: >60 mL/min (ref >60)
Glucose, Bld: 148 mg/dL — ABNORMAL HIGH (ref 70–99)
Potassium: 3.4 mmol/L — ABNORMAL LOW (ref 3.5–5.1)
Sodium: 130 mmol/L — ABNORMAL LOW (ref 135–145)
Total Bilirubin: 1.6 mg/dL — ABNORMAL HIGH (ref 0.3–1.2)
Total Protein: 7.7 g/dL (ref 6.5–8.1)

## 2021-04-19 LAB — LIPASE, BLOOD: Lipase: 4307 U/L — ABNORMAL HIGH (ref 11–51)

## 2021-04-19 MED ORDER — IOHEXOL 350 MG/ML SOLN
80.0000 mL | Freq: Once | INTRAVENOUS | Status: AC | PRN
  Administered 2021-04-19: 80 mL via INTRAVENOUS

## 2021-04-19 MED ORDER — ONDANSETRON 4 MG PO TBDP: 4.0000 mg | ORAL_TABLET | Freq: Once | ORAL | Status: DC | PRN

## 2021-04-19 MED ORDER — MORPHINE SULFATE (PF) 2 MG/ML IV SOLN
2.0000 mg | Freq: Once | INTRAVENOUS | Status: AC
  Administered 2021-04-20: 2 mg via INTRAVENOUS
  Filled 2021-04-19: qty 1

## 2021-04-19 MED ORDER — ONDANSETRON HCL 4 MG/2ML IJ SOLN
4.0000 mg | Freq: Once | INTRAMUSCULAR | Status: AC
  Administered 2021-04-20: 4 mg via INTRAVENOUS
  Filled 2021-04-19: qty 2

## 2021-04-19 NOTE — ED Notes (Signed)
Pt ambulatory in ED lobby. 

## 2021-04-19 NOTE — ED Provider Notes (Signed)
[facility] DEPT Provider Note   CSN: [id number] Arrival date & time: 04/19/21  2036     History Chief Complaint  Patient presents with   Abdominal Pain    [name] [name] is a 86 y.o. female sent in for evaluation of abdominal pain.  Patient states her pain began suddenly a few hours prior to arrival.  Pain is severe and constant, does not radiate.  Pain is crampy.  She has not taken anything for it.  No associated nausea, vomiting, diarrhea.  Last BM was yesterday, was normal.  No urinary symptoms.  She denies any fevers or chills.  No sick contacts.  She reports previous history of exploratory lap for a possible perirectal mass, but this was not excised.  No other abdominal surgeries.  Additional history obtained from chart review.  Patient with a history of hyperlipidemia, hypertension, IBS, perirectal mass, polymyalgia rheumatica, hypothyroidism  HPI     Past Medical History:  Diagnosis Date   Allergic angioedema    from shellfish and tree nuts   Chest pain 2000 and 2001   negative cardiac workup   Complication of anesthesia    Diverticulosis    Fibrocystic breast disease    Hyperlipidemia    Hypertension    Irritable bowel syndrome    Perirectal cyst    ?lymphocoele   PMR (polymyalgia rheumatica) ([place]) 2011   PONV (postoperative nausea and vomiting)    Seborrheic dermatitis    Squamous cell skin cancer    X1, [facility]   Thyroid disease    post op hypothyroidism; TSH goal would be sup;pressive    Patient Active Problem List   Diagnosis Date Noted   Choledocholithiasis 04/20/2021   DERMATITIS, SEBORRHEIC 07/14/2010   Squamous cell skin cancer 07/14/2010   Polymyalgia rheumatica ([place]) 06/23/2010   POSTMENOPAUSAL SYNDROME 06/03/2010   ARTHRALGIA 06/03/2010   THYROID CANCER, HX OF 02/06/2009   Hypothyroidism, postsurgical 06/16/2008   DIVERTICULOSIS, COLON 06/16/2008   HYPERLIPIDEMIA 06/29/2007   Essential hypertension  01/05/2007   IBS 01/05/2007   FIBROCYSTIC BREAST DISEASE 01/05/2007    Past Surgical History:  Procedure Laterality Date   COLONOSCOPY  2004   per Dr. Olevia Perches, clear but incomplete; Barium study added, repeat in 10 yrs    DILATION AND CURETTAGE OF UTERUS     ESOPHAGOGASTRODUODENOSCOPY (EGD) WITH PROPOFOL N/A 07/21/2018   Procedure: ESOPHAGOGASTRODUODENOSCOPY (EGD) WITH PROPOFOL;  Surgeon: Jackquline Denmark, MD;  Location: [facility];  Service: Endoscopy;  Laterality: N/A;   exporatory surgery for rectal mass  1986   peri rectal cyst   FOREIGN BODY REMOVAL  07/21/2018   Procedure: FOREIGN BODY REMOVAL;  Surgeon: Jackquline Denmark, MD;  Location: [facility];  Service: Endoscopy;;   THYROIDECTOMY  1988   S/P radiation previously seen by Liliane Bade, MD @ [facility], now followed by Dr. Leonides Schanz   TONSILLECTOMY  1972     OB History   No obstetric history on file.     Family History  Problem Relation Age of Onset   Parkinsonism Brother    Hypertension Mother    Prostate cancer Father    Breast cancer Maternal Grandmother    Hypertension Maternal Grandmother    Heart attack Paternal Uncle 36   Heart attack Paternal Grandfather 61    Social History   Tobacco Use   Smoking status: Never   Smokeless tobacco: Never  Substance Use Topics   Alcohol use: Yes    Alcohol/week: 0.0 standard drinks  Comment: very rarely   Drug use: No    Home Medications Prior to Admission medications   Medication Sig Start Date End Date Taking? Authorizing Provider  citalopram (CELEXA) 20 MG tablet TAKE ONE-HALF TABLET BY MOUTH DAILY AS DIRECTED 07/13/20  Yes [name], MD  diphenhydrAMINE (BENADRYL) 25 mg capsule Take 25 mg by mouth as needed.     Yes [provider]  EPINEPHrine 0.3 mg/0.3 mL IJ SOAJ injection Inject 0.3 mg into the muscle as needed for anaphylaxis. 06/15/20  Yes [name], MD  levothyroxine (SYNTHROID) 88 MCG tablet TAKE 1 TABLET(88 MCG) BY MOUTH DAILY  06/15/20  Yes [name], MD  metoprolol tartrate (LOPRESSOR) 100 MG tablet TAKE 1 TABLET BY MOUTH EVERY MORNING, AND 1/2 TABLET EVERY EVENING Patient taking differently: Take by mouth 2 (two) times daily. TAKE 1 TABLET BY MOUTH EVERY MORNING, AND 1/2 TABLET EVERY EVENING 08/10/20  Yes [name], MD  omeprazole (PRILOSEC) 20 MG capsule TAKE 1 CAPSULE(20 MG) BY MOUTH DAILY 06/15/20  Yes [name], MD  pravastatin (PRAVACHOL) 40 MG tablet Take 1 tablet (40 mg total) by mouth daily. Patient taking differently: Take 20 mg by mouth 2 (two) times daily. 06/15/20  Yes [name], MD  spironolactone (ALDACTONE) 25 MG tablet TAKE 1 TABLET(25 MG) BY MOUTH DAILY 10/20/20  Yes [name], MD  nitrofurantoin, macrocrystal-monohydrate, (MACROBID) 100 MG capsule Take 1 capsule (100 mg total) by mouth 2 (two) times daily. Patient not taking: Reported on 04/20/2021 06/23/20   [name], MD    Allergies    Horse-derived products, Other, Penicillins, Shellfish allergy, Shellfish-derived products, Bacitracin-polymyxin b, Eggs or egg-derived products, and Neomycin-bacitracin zn-polymyx  Review of Systems   Review of Systems  Gastrointestinal:  Positive for abdominal pain.  All other systems reviewed and are negative.  Physical Exam Updated Vital Signs BP 123/69   Pulse 88   Temp 97.8 F (36.6 C) (Oral)   Resp 20   Ht 5' 5.5" (1.664 m)   Wt 59 kg   SpO2 97%   BMI 21.30 kg/m   Physical Exam Vitals and nursing note reviewed.  Constitutional:      General: She is not in acute distress.    Appearance: Normal appearance.     Comments: Pt appears uncomfortable  HENT:     Head: Normocephalic and atraumatic.  Eyes:     Conjunctiva/sclera: Conjunctivae normal.     Pupils: Pupils are equal, round, and reactive to light.  Cardiovascular:     Rate and Rhythm: Normal rate and regular rhythm.     Pulses: Normal pulses.  Pulmonary:     Effort: Pulmonary effort is normal. No  respiratory distress.     Breath sounds: Normal breath sounds. No wheezing.     Comments: Speaking in full sentences.  Clear lung sounds in all fields. Abdominal:     General: There is no distension.     Palpations: Abdomen is soft. There is no mass.     Tenderness: There is abdominal tenderness. There is guarding. There is no rebound.     Comments: Tenderness palpation of epigastric and right upper quadrant abdomen.  Voluntary guarding.  No rigidity or distention.  No rebound.  No peritonitis  Musculoskeletal:        General: Normal range of motion.     Cervical back: Normal range of motion and neck supple.  Skin:    General: Skin is warm and dry.  Capillary Refill: Capillary refill takes less than 2 seconds.  Neurological:     Mental Status: She is alert and oriented to person, place, and time.  Psychiatric:        Mood and Affect: Mood and affect normal.        Speech: Speech normal.        Behavior: Behavior normal.    ED Results / Procedures / Treatments   Labs (all labs ordered are listed, but only abnormal results are displayed) Labs Reviewed  LIPASE, BLOOD - Abnormal; Notable for the following components:      Result Value   Lipase 4,307 (*)    All other components within normal limits  COMPREHENSIVE METABOLIC PANEL - Abnormal; Notable for the following components:   Sodium 130 (*)    Potassium 3.4 (*)    Chloride 92 (*)    Glucose, Bld 148 (*)    AST 592 (*)    ALT 231 (*)    Total Bilirubin 1.6 (*)    All other components within normal limits  CBC - Abnormal; Notable for the following components:   WBC 15.3 (*)    All other components within normal limits  LACTIC ACID, PLASMA - Abnormal; Notable for the following components:   Lactic Acid, Venous 4.3 (*)    All other components within normal limits  RESP PANEL BY RT-PCR (FLU A&B, COVID) ARPGX2  URINALYSIS, ROUTINE W REFLEX MICROSCOPIC  LACTIC ACID, PLASMA    EKG EKG Interpretation  Date/Time:  Monday  April 19 2021 22:00:38 EDT Ventricular Rate:  92 PR Interval:  189 QRS Duration: 91 QT Interval:  348 QTC Calculation: 431 R Axis:   35 Text Interpretation: Sinus rhythm Multiple ventricular premature complexes Left atrial enlargement Low voltage, precordial leads Abnormal R-wave progression, early transition 12 Lead; Mason-Likar No old tracing to compare Confirmed by Dorie Rank [phone]) on 04/19/2021 10:38:03 PM  Radiology CT ABDOMEN PELVIS W CONTRAST  Result Date: 04/19/2021 CLINICAL DATA:  Abdominal distension.  Upper abdominal pain. EXAM: CT ABDOMEN AND PELVIS WITH CONTRAST TECHNIQUE: Multidetector CT imaging of the abdomen and pelvis was performed using the standard protocol following bolus administration of intravenous contrast. CONTRAST:  63m OMNIPAQUE IOHEXOL 350 MG/ML SOLN COMPARISON:  None. FINDINGS: Lower chest: A 5 mm right lower lobe pulmonary nodule is only partially included in the field of view, image 1 series 6. There is a 4 mm subpleural nodule in the right lower lobe, series 6, image 5. No pleural effusion. There is fluid within the distal esophagus with small hiatal hernia. Hepatobiliary: There is intra and extrahepatic biliary ductal dilatation with multiple distal common bile duct stones. At least 6 stones are seen within the mid and distal common bile duct, measuring approximately 5 mm. Common bile duct measures 14 mm. Gallbladder is distended and contains innumerable intraluminal gallstones. There is mild gallbladder wall thickening. Prominent intrahepatic biliary ductal dilatation. Occasional small hepatic cysts. No suspicious liver lesion. Pancreas: Mild peripancreatic fat stranding about the distal body and tail. There is no pancreatic ductal dilatation. No evidence of pancreatic mass. Spleen: Normal in size without focal abnormality. Adrenals/Urinary Tract: There is a 13 mm left adrenal nodule, nonspecific. Normal right adrenal gland. No hydronephrosis or perinephric edema.  Homogeneous renal enhancement with symmetric excretion on delayed phase imaging. Urinary bladder is physiologically distended without wall thickening. Stomach/Bowel: Small hiatal hernia distended with intraluminal fluid. Fluid-filled stomach. No gastric wall thickening. No duodenal wall thickening. There is a small periampullary duodenal diverticulum.  Decompressed small bowel. Appendix is not confidently visualized, no appendicitis. Moderate volume of colonic stool. The sigmoid colon is redundant. There is prominent diverticulosis involving the distal descending and sigmoid colon. No diverticulitis. Cystic structure posterior to the rectum measures 7.0 x 3.9 cm, this was described on remote 2001 pelvic MRI and is considered benign. Vascular/Lymphatic: Moderate aortic atherosclerosis. No aortic aneurysm. Patent portal vein. No acute vascular findings. No abdominopelvic adenopathy. Reproductive: Atrophic uterus. There is a 12 mm cyst in the right ovary. No follow-up imaging recommended. Note: This recommendation does not apply to premenarchal patients and to those with increased risk (genetic, family history, elevated tumor markers or other high-risk factors) of ovarian cancer. Reference: JACR 2020 Feb; 17(2):248-254 Other: Cyst posterior to the rectum as described above. No free air or ascites. No abdominal wall hernia. Musculoskeletal: There are no acute or suspicious osseous abnormalities. Age related degenerative change in the spine. IMPRESSION: 1. Choledocholithiasis with multiple distal common bile duct stones, intra and extrahepatic biliary ductal dilatation, and gallbladder distention. Mild gallbladder wall thickening. Multiple intraluminal gallstones. While MRCP is often recommended to assess the biliary tree, stones are readily apparent on CT, and MRCP is not recommended in this elderly patient, as choledocholithiasis is well visualized by CT. 2. Mild peripancreatic fat stranding about the distal body and  tail, suspicious for gallstone pancreatitis. There is no pancreatic ductal dilatation or evidence of pancreatic mass. 3. Colonic diverticulosis without diverticulitis. 4. Small hiatal hernia with fluid within the distal esophagus, suggesting reflux. 5. Right lower lobe pulmonary nodules, largest measuring 5 mm. No follow-up needed if patient is low-risk (and has no known or suspected primary neoplasm). Non-contrast chest CT can be considered in 12 months if patient is high-risk. This recommendation follows the consensus statement: Guidelines for Management of Incidental Pulmonary Nodules Detected on CT Images: From the Fleischner Society 2017; Radiology 2017; 284:228-243. 6. A 13 mm left adrenal nodule is nonspecific. 7. Benign cyst posterior to the rectum, described on remote 2001 pelvic MRI and considered benign. Aortic Atherosclerosis (ICD10-I70.0). Electronically Signed   By: Keith Rake M.D.   On: 04/19/2021 23:42    Procedures Procedures   Medications Ordered in ED Medications  lactated ringers infusion (has no administration in time range)  ondansetron (ZOFRAN) injection 4 mg (4 mg Intravenous Given 04/20/21 0008)  morphine 2 MG/ML injection 2 mg (2 mg Intravenous Given 04/20/21 0008)  iohexol (OMNIPAQUE) 350 MG/ML injection 80 mL (80 mLs Intravenous Contrast Given 04/19/21 2313)  piperacillin-tazobactam (ZOSYN) IVPB 3.375 g (0 g Intravenous Stopped 04/20/21 0054)  lactated ringers bolus 1,000 mL (1,000 mLs Intravenous New Bag/Given 04/20/21 0058)    ED Course  I have reviewed the triage vital signs and the nursing notes.  Pertinent labs & imaging results that were available during my care of the patient were reviewed by me and considered in my medical decision making (see chart for details).    MDM Rules/Calculators/A&P                           Patient presented for evaluation of abdominal pain.  On exam, patient appears very uncomfortable due to pain, however otherwise nontoxic.   She does have tenderness palpation of the epigastric and right upper quadrant abdomen.  Concern for gallbladder etiology, pancreatitis, gastritis, viral GI illness.  Less likely bowel obstruction, however could be early.  Labs and CT ordered from triage.  Labs interpreted by me, shows leukocytosis of 15.  LFTs elevated and bili mildly elevated at 1.6.  Lipase is significantly elevated at 4000.  CT shows choledocholithiasis with multiple stones.  Concern for gallstone pancreatitis. Will consult with gen surgery and plan for medicine admit.  Discussed with Dr. Marlou Starks from general surgery, his team will evaluate the patient tomorrow.  Recommends GI consultation and admission to medicine.  GI team added to the patient's treatment team and secure chat message was sent to [facility].  Case discussed with attending, Dr. Dolly Rias evaluated the patient.  Discussed with Dr. Hal Hope from triad hospitalist service, pt to be admitted.   Final Clinical Impression(s) / ED Diagnoses Final diagnoses:  Choledocholithiasis  Gallstone pancreatitis    Rx / DC Orders ED Discharge Orders     None        Franchot Heidelberg, PA-C 04/20/21 0143    Mesner, Corene Cornea, MD 04/20/21 [id number]

## 2021-04-19 NOTE — ED Provider Notes (Signed)
Emergency Medicine Provider Triage Evaluation Note  [name] [name] , a 86 y.o. female  was evaluated in triage.  Pt complains of upper abdominal pain that started a few hours prior to arrival.  Patient denies associated nausea, vomiting, or diarrhea.  Last bowel movement was yesterday.  Denies chest pain and shortness of breath.  Patient notes pain is worse than "labor pains". Abdominal pain associated with subjective distention. No fever. No dysuria.  Review of Systems  Positive: Abdominal pain Negative: fever  Physical Exam  BP (!) 171/91 (BP Location: Left Arm)   Pulse 78   Temp 97.8 F (36.6 C) (Oral)   Ht 5' 5.5" (1.664 m)   Wt 59 kg   SpO2 96%   BMI 21.30 kg/m  Gen:   Awake, no distress   Resp:  Normal effort  MSK:   Moves extremities without difficulty  Other:  TTP in upper quadrants  Medical Decision Making  Medically screening exam initiated at 9:15 PM.  Appropriate orders placed.  [name] [name] was informed that the remainder of the evaluation will be completed by another provider, this initial triage assessment does not replace that evaluation, and the importance of remaining in the ED until their evaluation is complete.  Abdominal labs EKG CT abdomen   [name] [name] 04/19/21 2117    Dorie Rank, MD 04/20/21 [phone]

## 2021-04-19 NOTE — ED Triage Notes (Signed)
Patient complaining of abdominal pain around 2pm. Patient last bm was yesterday. No other symptoms per patient.

## 2021-04-19 NOTE — ED Provider Notes (Signed)
I provided a substantive portion of the care of this patient.  I personally performed the entirety of the history, exam, and medical decision making for this encounter. Acute onset of abdominal pain just prior to coming here. Has already had meds on my exam so she feels somewhat better but with persistent RUQ ttp. No emesis. Appears nontoxic.  Labs/imaging concerning for choledocholithiasis. Plan for EGS/GI/hospitalist consultations for management.   EKG Interpretation  Date/Time:  Monday April 19 2021 22:00:38 EDT Ventricular Rate:  92 PR Interval:  189 QRS Duration: 91 QT Interval:  348 QTC Calculation: 431 R Axis:   35 Text Interpretation: Sinus rhythm Multiple ventricular premature complexes Left atrial enlargement Low voltage, precordial leads Abnormal R-wave progression, early transition 12 Lead; Mason-Likar No old tracing to compare Confirmed by Dorie Rank [phone]) on 04/19/2021 10:38:03 PM    [name] [name], Corene Cornea, MD 04/20/21 [id number]

## 2021-04-20 DIAGNOSIS — K222 Esophageal obstruction: Secondary | ICD-10-CM | POA: Diagnosis not present

## 2021-04-20 DIAGNOSIS — E890 Postprocedural hypothyroidism: Secondary | ICD-10-CM | POA: Diagnosis not present

## 2021-04-20 DIAGNOSIS — K2100 Gastro-esophageal reflux disease with esophagitis, without bleeding: Secondary | ICD-10-CM | POA: Diagnosis present

## 2021-04-20 DIAGNOSIS — K589 Irritable bowel syndrome without diarrhea: Secondary | ICD-10-CM | POA: Diagnosis present

## 2021-04-20 DIAGNOSIS — I10 Essential (primary) hypertension: Secondary | ICD-10-CM | POA: Diagnosis not present

## 2021-04-20 DIAGNOSIS — K851 Biliary acute pancreatitis without necrosis or infection: Secondary | ICD-10-CM | POA: Diagnosis not present

## 2021-04-20 DIAGNOSIS — Z8042 Family history of malignant neoplasm of prostate: Secondary | ICD-10-CM | POA: Diagnosis not present

## 2021-04-20 DIAGNOSIS — E7800 Pure hypercholesterolemia, unspecified: Secondary | ICD-10-CM | POA: Diagnosis not present

## 2021-04-20 DIAGNOSIS — K8070 Calculus of gallbladder and bile duct without cholecystitis without obstruction: Secondary | ICD-10-CM | POA: Diagnosis not present

## 2021-04-20 DIAGNOSIS — E871 Hypo-osmolality and hyponatremia: Secondary | ICD-10-CM | POA: Diagnosis not present

## 2021-04-20 DIAGNOSIS — M353 Polymyalgia rheumatica: Secondary | ICD-10-CM | POA: Diagnosis not present

## 2021-04-20 DIAGNOSIS — K449 Diaphragmatic hernia without obstruction or gangrene: Secondary | ICD-10-CM | POA: Diagnosis present

## 2021-04-20 DIAGNOSIS — Z8249 Family history of ischemic heart disease and other diseases of the circulatory system: Secondary | ICD-10-CM | POA: Diagnosis not present

## 2021-04-20 DIAGNOSIS — K8050 Calculus of bile duct without cholangitis or cholecystitis without obstruction: Secondary | ICD-10-CM | POA: Diagnosis not present

## 2021-04-20 DIAGNOSIS — I700 Atherosclerosis of aorta: Secondary | ICD-10-CM | POA: Diagnosis not present

## 2021-04-20 DIAGNOSIS — E876 Hypokalemia: Secondary | ICD-10-CM | POA: Diagnosis not present

## 2021-04-20 DIAGNOSIS — Z820 Family history of epilepsy and other diseases of the nervous system: Secondary | ICD-10-CM | POA: Diagnosis not present

## 2021-04-20 DIAGNOSIS — Z85850 Personal history of malignant neoplasm of thyroid: Secondary | ICD-10-CM | POA: Diagnosis not present

## 2021-04-20 DIAGNOSIS — K8010 Calculus of gallbladder with chronic cholecystitis without obstruction: Secondary | ICD-10-CM | POA: Diagnosis not present

## 2021-04-20 DIAGNOSIS — D649 Anemia, unspecified: Secondary | ICD-10-CM | POA: Diagnosis not present

## 2021-04-20 DIAGNOSIS — E785 Hyperlipidemia, unspecified: Secondary | ICD-10-CM | POA: Diagnosis not present

## 2021-04-20 DIAGNOSIS — K8051 Calculus of bile duct without cholangitis or cholecystitis with obstruction: Secondary | ICD-10-CM | POA: Diagnosis not present

## 2021-04-20 DIAGNOSIS — I119 Hypertensive heart disease without heart failure: Secondary | ICD-10-CM | POA: Diagnosis not present

## 2021-04-20 DIAGNOSIS — K660 Peritoneal adhesions (postprocedural) (postinfection): Secondary | ICD-10-CM | POA: Diagnosis present

## 2021-04-20 DIAGNOSIS — R1310 Dysphagia, unspecified: Secondary | ICD-10-CM | POA: Diagnosis not present

## 2021-04-20 DIAGNOSIS — Z7982 Long term (current) use of aspirin: Secondary | ICD-10-CM | POA: Diagnosis not present

## 2021-04-20 DIAGNOSIS — Z923 Personal history of irradiation: Secondary | ICD-10-CM | POA: Diagnosis not present

## 2021-04-20 DIAGNOSIS — Z20822 Contact with and (suspected) exposure to covid-19: Secondary | ICD-10-CM | POA: Diagnosis not present

## 2021-04-20 LAB — URINALYSIS, ROUTINE W REFLEX MICROSCOPIC
Bilirubin Urine: NEGATIVE
Glucose, UA: NEGATIVE mg/dL
Ketones, ur: NEGATIVE mg/dL
Nitrite: NEGATIVE
Protein, ur: NEGATIVE mg/dL
Specific Gravity, Urine: <1.005 — ABNORMAL LOW (ref 1.005–1.030)
pH: 6 (ref 5.0–8.0)

## 2021-04-20 LAB — LIPASE, BLOOD: Lipase: 1041 U/L — ABNORMAL HIGH (ref 11–51)

## 2021-04-20 LAB — URINALYSIS, MICROSCOPIC (REFLEX)

## 2021-04-20 LAB — HEPATIC FUNCTION PANEL
ALT: 372 U/L — ABNORMAL HIGH (ref 0–44)
AST: 606 U/L — ABNORMAL HIGH (ref 15–41)
Albumin: 3.4 g/dL — ABNORMAL LOW (ref 3.5–5.0)
Alkaline Phosphatase: 104 U/L (ref 38–126)
Bilirubin, Direct: 1.5 mg/dL — ABNORMAL HIGH (ref 0.0–0.2)
Indirect Bilirubin: 1.1 mg/dL — ABNORMAL HIGH (ref 0.3–0.9)
Total Bilirubin: 2.6 mg/dL — ABNORMAL HIGH (ref 0.3–1.2)
Total Protein: 5.9 g/dL — ABNORMAL LOW (ref 6.5–8.1)

## 2021-04-20 LAB — CBC WITH DIFFERENTIAL/PLATELET
Abs Immature Granulocytes: 0.23 10*3/uL — ABNORMAL HIGH (ref 0.00–0.07)
Basophils Absolute: 0 10*3/uL (ref 0.0–0.1)
Basophils Relative: 0 %
Eosinophils Absolute: 0 10*3/uL (ref 0.0–0.5)
Eosinophils Relative: 0 %
HCT: 33.7 % — ABNORMAL LOW (ref 36.0–46.0)
Hemoglobin: 11.9 g/dL — ABNORMAL LOW (ref 12.0–15.0)
Immature Granulocytes: 1 %
Lymphocytes Relative: 4 %
Lymphs Abs: 0.7 10*3/uL (ref 0.7–4.0)
MCH: 32.3 pg (ref 26.0–34.0)
MCHC: 35.3 g/dL (ref 30.0–36.0)
MCV: 91.6 fL (ref 80.0–100.0)
Monocytes Absolute: 1 10*3/uL (ref 0.1–1.0)
Monocytes Relative: 5 %
Neutro Abs: 18.8 10*3/uL — ABNORMAL HIGH (ref 1.7–7.7)
Neutrophils Relative %: 90 %
Platelets: 204 10*3/uL (ref 150–400)
RBC: 3.68 MIL/uL — ABNORMAL LOW (ref 3.87–5.11)
RDW: 11.7 % (ref 11.5–15.5)
WBC: 20.8 10*3/uL — ABNORMAL HIGH (ref 4.0–10.5)
nRBC: 0 % (ref 0.0–0.2)

## 2021-04-20 LAB — LACTIC ACID, PLASMA
Lactic Acid, Venous: 4.3 mmol/L (ref 0.5–1.9)
Lactic Acid, Venous: 4 mmol/L (ref 0.5–1.9)
Lactic Acid, Venous: 3.9 mmol/L (ref 0.5–1.9)
Lactic Acid, Venous: 4 mmol/L (ref 0.5–1.9)
Lactic Acid, Venous: 1.5 mmol/L (ref 0.5–1.9)

## 2021-04-20 LAB — BASIC METABOLIC PANEL
Anion gap: 10 (ref 5–15)
BUN: 9 mg/dL (ref 8–23)
CO2: 26 mmol/L (ref 22–32)
Calcium: 8.7 mg/dL — ABNORMAL LOW (ref 8.9–10.3)
Chloride: 95 mmol/L — ABNORMAL LOW (ref 98–111)
Creatinine, Ser: 0.59 mg/dL (ref 0.44–1.00)
GFR, Estimated: >60 mL/min (ref >60)
Glucose, Bld: 149 mg/dL — ABNORMAL HIGH (ref 70–99)
Potassium: 3.8 mmol/L (ref 3.5–5.1)
Sodium: 131 mmol/L — ABNORMAL LOW (ref 135–145)

## 2021-04-20 LAB — CBG MONITORING, ED: Glucose-Capillary: 170 mg/dL — ABNORMAL HIGH (ref 70–99)

## 2021-04-20 LAB — GLUCOSE, CAPILLARY: Glucose-Capillary: 100 mg/dL — ABNORMAL HIGH (ref 70–99)

## 2021-04-20 LAB — RESP PANEL BY RT-PCR (FLU A&B, COVID) ARPGX2
Influenza A by PCR: NEGATIVE
Influenza B by PCR: NEGATIVE
SARS Coronavirus 2 by RT PCR: NEGATIVE

## 2021-04-20 MED ORDER — ONDANSETRON HCL 4 MG/2ML IJ SOLN: 4.0000 mg | Freq: Four times a day (QID) | INTRAMUSCULAR | Status: DC | PRN

## 2021-04-20 MED ORDER — LEVOTHYROXINE SODIUM 100 MCG/5ML IV SOLN
44.0000 ug | Freq: Every day | INTRAVENOUS | Status: DC
  Administered 2021-04-20 – 2021-04-24 (×5): 44 ug via INTRAVENOUS
  Filled 2021-04-20 (×5): qty 5

## 2021-04-20 MED ORDER — PIPERACILLIN-TAZOBACTAM 3.375 G IVPB
3.3750 g | Freq: Three times a day (TID) | INTRAVENOUS | Status: DC
  Administered 2021-04-20 – 2021-04-23 (×10): 3.375 g via INTRAVENOUS
  Filled 2021-04-20 (×12): qty 50

## 2021-04-20 MED ORDER — MORPHINE SULFATE (PF) 2 MG/ML IV SOLN
2.0000 mg | INTRAVENOUS | Status: DC | PRN
  Administered 2021-04-20 – 2021-04-24 (×3): 2 mg via INTRAVENOUS
  Filled 2021-04-20 (×3): qty 1

## 2021-04-20 MED ORDER — PANTOPRAZOLE SODIUM 40 MG IV SOLR
40.0000 mg | Freq: Every day | INTRAVENOUS | Status: DC
  Administered 2021-04-20 – 2021-04-24 (×5): 40 mg via INTRAVENOUS
  Filled 2021-04-20 (×5): qty 40

## 2021-04-20 MED ORDER — PIPERACILLIN-TAZOBACTAM 3.375 G IVPB 30 MIN
3.3750 g | Freq: Once | INTRAVENOUS | Status: AC
Start: 2021-04-20 — End: 2021-04-20
  Administered 2021-04-20: 3.375 g via INTRAVENOUS
  Filled 2021-04-20: qty 50

## 2021-04-20 MED ORDER — LACTATED RINGERS IV SOLN: INTRAVENOUS | Status: DC

## 2021-04-20 MED ORDER — LACTATED RINGERS IV BOLUS
1000.0000 mL | Freq: Once | INTRAVENOUS | Status: AC
  Administered 2021-04-20: 1000 mL via INTRAVENOUS

## 2021-04-20 MED ORDER — SODIUM CHLORIDE 0.9 % IV SOLN: INTRAVENOUS | Status: DC

## 2021-04-20 NOTE — ED Notes (Signed)
This RN assuming care of pt, noted that pt lactic trending up, this RN notified attending Rodena Piety MD, Will continue to monitor.

## 2021-04-20 NOTE — H&P (Signed)
History and Physical    [name] [name] [id number] DOB: [birth year] DOA: 04/19/2021  PCP: [name], MD  Patient coming from: Home.  Chief Complaint: Abdominal pain.  HPI: [name] [name] is a 86 y.o. female with history of hypertension and postsurgical hypothyroidism presents to the ER after patient started having severe abdominal pain at noon time after breakfast.  Pain is mostly epigastric radiating to back with no vomiting.  Denies any fever chills chest pain shortness of breath or diarrhea.  ED Course: In the ER patient's labs show markedly elevated LFTs with AST around 592 ALT around 231 total bilirubin of 1.6 lipase of 4300 WBC of 15.3 with CT scan of the abdomen pelvis showing features concerning for gallstone pancreatitis with choledocholithiasis and multiple gallstones in the distal common bile duct.  Patient was started on aggressive hydration and antibiotics.  COVID test is negative.  Review of Systems: As per HPI, rest all negative.   Past Medical History:  Diagnosis Date   Allergic angioedema    from shellfish and tree nuts   Chest pain 2000 and 2001   negative cardiac workup   Complication of anesthesia    Diverticulosis    Fibrocystic breast disease    Hyperlipidemia    Hypertension    Irritable bowel syndrome    Perirectal cyst    ?lymphocoele   PMR (polymyalgia rheumatica) ([place]) 2011   PONV (postoperative nausea and vomiting)    Seborrheic dermatitis    Squamous cell skin cancer    X1, [facility]   Thyroid disease    post op hypothyroidism; TSH goal would be sup;pressive    Past Surgical History:  Procedure Laterality Date   COLONOSCOPY  2004   per Dr. Olevia Perches, clear but incomplete; Barium study added, repeat in 10 yrs    DILATION AND CURETTAGE OF UTERUS     ESOPHAGOGASTRODUODENOSCOPY (EGD) WITH PROPOFOL N/A 07/21/2018   Procedure: ESOPHAGOGASTRODUODENOSCOPY (EGD) WITH PROPOFOL;  Surgeon: Jackquline Denmark, MD;  Location: [facility];  Service:  Endoscopy;  Laterality: N/A;   exporatory surgery for rectal mass  1986   peri rectal cyst   FOREIGN BODY REMOVAL  07/21/2018   Procedure: FOREIGN BODY REMOVAL;  Surgeon: Jackquline Denmark, MD;  Location: [facility];  Service: Endoscopy;;   THYROIDECTOMY  1988   S/P radiation previously seen by Liliane Bade, MD @ [facility], now followed by Dr. Leonides Schanz   TONSILLECTOMY  1972     reports that she has never smoked. She has never used smokeless tobacco. She reports current alcohol use. She reports that she does not use drugs.  Allergies  Allergen Reactions   Horse-Derived Products     Redness & swelling of arm   Other Anaphylaxis    Tree nuts (not peanuts)  Other reaction(s): Other (See Comments) Uncoded Allergy. Allergen: eggs, Other Reaction: red and swelling   Penicillins     Rash    Shellfish Allergy Other (See Comments)    Other Reaction: throat closes up   Shellfish-Derived Products     Throat swelling   Bacitracin-Polymyxin B     Local swelling   Eggs Or Egg-Derived Products     REACTION: Unable to get Flu Vaccine Skin Tests+ for egg products   Neomycin-Bacitracin Zn-Polymyx     Family History  Problem Relation Age of Onset   Parkinsonism Brother    Hypertension Mother    Prostate cancer Father    Breast cancer Maternal Grandmother    Hypertension Maternal Grandmother  Heart attack Paternal Uncle 34   Heart attack Paternal Grandfather 28    Prior to Admission medications   Medication Sig Start Date End Date Taking? Authorizing Provider  citalopram (CELEXA) 20 MG tablet TAKE ONE-HALF TABLET BY MOUTH DAILY AS DIRECTED 07/13/20  Yes [name], MD  diphenhydrAMINE (BENADRYL) 25 mg capsule Take 25 mg by mouth as needed.     Yes [provider]  EPINEPHrine 0.3 mg/0.3 mL IJ SOAJ injection Inject 0.3 mg into the muscle as needed for anaphylaxis. 06/15/20  Yes [name], MD  levothyroxine (SYNTHROID) 88 MCG tablet TAKE 1 TABLET(88 MCG) BY MOUTH DAILY  06/15/20  Yes [name], MD  metoprolol tartrate (LOPRESSOR) 100 MG tablet TAKE 1 TABLET BY MOUTH EVERY MORNING, AND 1/2 TABLET EVERY EVENING Patient taking differently: Take by mouth 2 (two) times daily. TAKE 1 TABLET BY MOUTH EVERY MORNING, AND 1/2 TABLET EVERY EVENING 08/10/20  Yes [name], MD  omeprazole (PRILOSEC) 20 MG capsule TAKE 1 CAPSULE(20 MG) BY MOUTH DAILY 06/15/20  Yes [name], MD  pravastatin (PRAVACHOL) 40 MG tablet Take 1 tablet (40 mg total) by mouth daily. Patient taking differently: Take 20 mg by mouth 2 (two) times daily. 06/15/20  Yes [name], MD  spironolactone (ALDACTONE) 25 MG tablet TAKE 1 TABLET(25 MG) BY MOUTH DAILY 10/20/20  Yes [name], MD  nitrofurantoin, macrocrystal-monohydrate, (MACROBID) 100 MG capsule Take 1 capsule (100 mg total) by mouth 2 (two) times daily. Patient not taking: Reported on 04/20/2021 06/23/20   [name], MD    Physical Exam: Constitutional: Moderately built and nourished. Vitals:   04/20/21 0015 04/20/21 0030 04/20/21 0100 04/20/21 0130  BP: 125/60 (!) 121/57 (!) 121/58 123/69  Pulse: 88 91 86 88  Resp: '13 14 15 20  '$ Temp:      TempSrc:      SpO2: 97% 94% 91% 97%  Weight:      Height:       Eyes: Anicteric no pallor. ENMT: No discharge from the ears eyes nose and mouth. Neck: No mass felt.  No neck rigidity. Respiratory: No rhonchi or crepitations. Cardiovascular: S1-S2 heard. Abdomen: Soft epigastric tenderness present.  No guarding or rigidity. Musculoskeletal: No edema. Skin: No rash. Neurologic: Alert awake oriented to time place and person.  Moves all extremities. Psychiatric: Appears normal.  Normal affect.   Labs on Admission: I have personally reviewed following labs and imaging studies  CBC: Recent Labs  Lab 04/19/21 2120  WBC 15.3*  HGB 14.2  HCT 41.1  MCV 91.7  PLT [id number]   Basic Metabolic Panel: Recent Labs  Lab 04/19/21 2120  NA 130*  K 3.4*  CL 92*  CO2 26   GLUCOSE 148*  BUN 10  CREATININE 0.55  CALCIUM 9.4   GFR: Estimated Creatinine Clearance: 49 mL/min (by C-G formula based on SCr of 0.55 mg/dL). Liver Function Tests: Recent Labs  Lab 04/19/21 2120  AST 592*  ALT 231*  ALKPHOS 113  BILITOT 1.6*  PROT 7.7  ALBUMIN 4.6   Recent Labs  Lab 04/19/21 2120  LIPASE 4,307*   No results for input(s): AMMONIA in the last 168 hours. Coagulation Profile: No results for input(s): INR, PROTIME in the last 168 hours. Cardiac Enzymes: No results for input(s): CKTOTAL, CKMB, CKMBINDEX, TROPONINI in the last 168 hours. BNP (last 3 results) No results for input(s): PROBNP in the last 8760 hours. HbA1C: No results for input(s): HGBA1C in the  last 72 hours. CBG: No results for input(s): GLUCAP in the last 168 hours. Lipid Profile: No results for input(s): CHOL, HDL, LDLCALC, TRIG, CHOLHDL, LDLDIRECT in the last 72 hours. Thyroid Function Tests: No results for input(s): TSH, T4TOTAL, FREET4, T3FREE, THYROIDAB in the last 72 hours. Anemia Panel: No results for input(s): VITAMINB12, FOLATE, FERRITIN, TIBC, IRON, RETICCTPCT in the last 72 hours. Urine analysis:    Component Value Date/Time   COLORURINE YELLOW 06/22/2020 1006   APPEARANCEUR CLOUDY (A) 06/22/2020 1006   LABSPEC 1.004 06/22/2020 1006   PHURINE 7.5 06/22/2020 1006   GLUCOSEU NEGATIVE 06/22/2020 1006   HGBUR 1+ (A) 06/22/2020 1006   BILIRUBINUR n 10/04/2017 1221   KETONESUR NEGATIVE 06/22/2020 1006   PROTEINUR NEGATIVE 06/22/2020 1006   UROBILINOGEN 0.2 10/04/2017 1221   NITRITE NEGATIVE 06/22/2020 1006   LEUKOCYTESUR 3+ (A) 06/22/2020 1006   Sepsis Labs: '@LABRCNTIP'$ (procalcitonin:4,lacticidven:4) ) Recent Results (from the past 240 hour(s))  Resp Panel by RT-PCR (Flu A&B, Covid) Nasopharyngeal Swab     Status: None   Collection Time: 04/20/21 12:04 AM   Specimen: Nasopharyngeal Swab; Nasopharyngeal(NP) swabs in vial transport medium  Result Value Ref Range Status    SARS Coronavirus 2 by RT PCR NEGATIVE NEGATIVE Final    Comment: (NOTE) SARS-CoV-2 target nucleic acids are NOT DETECTED.  The SARS-CoV-2 RNA is generally detectable in upper respiratory specimens during the acute phase of infection. The lowest concentration of SARS-CoV-2 viral copies this assay can detect is 138 copies/mL. A negative result does not preclude SARS-Cov-2 infection and should not be used as the sole basis for treatment or other patient management decisions. A negative result may occur with  improper specimen collection/handling, submission of specimen other than nasopharyngeal swab, presence of viral mutation(s) within the areas targeted by this assay, and inadequate number of viral copies(<138 copies/mL). A negative result must be combined with clinical observations, patient history, and epidemiological information. The expected result is Negative.  Fact Sheet for Patients:  EntrepreneurPulse.com.au  Fact Sheet for Healthcare Providers:  IncredibleEmployment.be  This test is no t yet approved or cleared by the [place] FDA and  has been authorized for detection and/or diagnosis of SARS-CoV-2 by FDA under an Emergency Use Authorization (EUA). This EUA will remain  in effect (meaning this test can be used) for the duration of the COVID-19 declaration under Section 564(b)(1) of the Act, 21 U.S.C.section 360bbb-3(b)(1), unless the authorization is terminated  or revoked sooner.       Influenza A by PCR NEGATIVE NEGATIVE Final   Influenza B by PCR NEGATIVE NEGATIVE Final    Comment: (NOTE) The Xpert Xpress SARS-CoV-2/FLU/RSV plus assay is intended as an aid in the diagnosis of influenza from Nasopharyngeal swab specimens and should not be used as a sole basis for treatment. Nasal washings and aspirates are unacceptable for Xpert Xpress SARS-CoV-2/FLU/RSV testing.  Fact Sheet for  Patients: EntrepreneurPulse.com.au  Fact Sheet for Healthcare Providers: IncredibleEmployment.be  This test is not yet approved or cleared by the [place] FDA and has been authorized for detection and/or diagnosis of SARS-CoV-2 by FDA under an Emergency Use Authorization (EUA). This EUA will remain in effect (meaning this test can be used) for the duration of the COVID-19 declaration under Section 564(b)(1) of the Act, 21 U.S.C. section 360bbb-3(b)(1), unless the authorization is terminated or revoked.  Performed at [facility], [place] [address], [place], [place] [address]      Radiological Exams on Admission: [place]  Result Date: 04/19/2021 CLINICAL DATA:  Abdominal distension.  Upper abdominal pain. EXAM: CT ABDOMEN AND PELVIS WITH CONTRAST TECHNIQUE: Multidetector CT imaging of the abdomen and pelvis was performed using the standard protocol following bolus administration of intravenous contrast. CONTRAST:  72m OMNIPAQUE IOHEXOL 350 MG/ML SOLN COMPARISON:  None. FINDINGS: Lower chest: A 5 mm right lower lobe pulmonary nodule is only partially included in the field of view, image 1 series 6. There is a 4 mm subpleural nodule in the right lower lobe, series 6, image 5. No pleural effusion. There is fluid within the distal esophagus with small hiatal hernia. Hepatobiliary: There is intra and extrahepatic biliary ductal dilatation with multiple distal common bile duct stones. At least 6 stones are seen within the mid and distal common bile duct, measuring approximately 5 mm. Common bile duct measures 14 mm. Gallbladder is distended and contains innumerable intraluminal gallstones. There is mild gallbladder wall thickening. Prominent intrahepatic biliary ductal dilatation. Occasional small hepatic cysts. No suspicious liver lesion. Pancreas: Mild peripancreatic fat stranding about the distal body and tail. There is  no pancreatic ductal dilatation. No evidence of pancreatic mass. Spleen: Normal in size without focal abnormality. Adrenals/Urinary Tract: There is a 13 mm left adrenal nodule, nonspecific. Normal right adrenal gland. No hydronephrosis or perinephric edema. Homogeneous renal enhancement with symmetric excretion on delayed phase imaging. Urinary bladder is physiologically distended without wall thickening. Stomach/Bowel: Small hiatal hernia distended with intraluminal fluid. Fluid-filled stomach. No gastric wall thickening. No duodenal wall thickening. There is a small periampullary duodenal diverticulum. Decompressed small bowel. Appendix is not confidently visualized, no appendicitis. Moderate volume of colonic stool. The sigmoid colon is redundant. There is prominent diverticulosis involving the distal descending and sigmoid colon. No diverticulitis. Cystic structure posterior to the rectum measures 7.0 x 3.9 cm, this was described on remote 2001 pelvic MRI and is considered benign. Vascular/Lymphatic: Moderate aortic atherosclerosis. No aortic aneurysm. Patent portal vein. No acute vascular findings. No abdominopelvic adenopathy. Reproductive: Atrophic uterus. There is a 12 mm cyst in the right ovary. No follow-up imaging recommended. Note: This recommendation does not apply to premenarchal patients and to those with increased risk (genetic, family history, elevated tumor markers or other high-risk factors) of ovarian cancer. Reference: JACR 2020 Feb; 17(2):248-254 Other: Cyst posterior to the rectum as described above. No free air or ascites. No abdominal wall hernia. Musculoskeletal: There are no acute or suspicious osseous abnormalities. Age related degenerative change in the spine. IMPRESSION: 1. Choledocholithiasis with multiple distal common bile duct stones, intra and extrahepatic biliary ductal dilatation, and gallbladder distention. Mild gallbladder wall thickening. Multiple intraluminal gallstones.  While MRCP is often recommended to assess the biliary tree, stones are readily apparent on CT, and MRCP is not recommended in this elderly patient, as choledocholithiasis is well visualized by CT. 2. Mild peripancreatic fat stranding about the distal body and tail, suspicious for gallstone pancreatitis. There is no pancreatic ductal dilatation or evidence of pancreatic mass. 3. Colonic diverticulosis without diverticulitis. 4. Small hiatal hernia with fluid within the distal esophagus, suggesting reflux. 5. Right lower lobe pulmonary nodules, largest measuring 5 mm. No follow-up needed if patient is low-risk (and has no known or suspected primary neoplasm). Non-contrast chest CT can be considered in 12 months if patient is high-risk. This recommendation follows the consensus statement: Guidelines for Management of Incidental Pulmonary Nodules Detected on CT Images: From the Fleischner Society 2017; Radiology 2017; 284:228-243. 6. A 13 mm left adrenal nodule is nonspecific. 7. Benign cyst posterior  to the rectum, described on remote 2001 pelvic MRI and considered benign. Aortic Atherosclerosis (ICD10-I70.0). Electronically Signed   By: Keith Rake M.D.   On: 04/19/2021 23:42    EKG: Independently reviewed.  Normal sinus rhythm with PVCs.  Assessment/Plan Principal Problem:   Gallstone pancreatitis Active Problems:   Hypothyroidism, postsurgical   Essential hypertension   Choledocholithiasis    Acute gallstone pancreatitis with choledocholithiasis and multiple distal common bile duct stones with the possible acute cholecystitis for which patient is kept n.p.o. and antibiotics and aggressive IV hydration.  We will consult gastroenterology and general surgery.  Closely follow intake output metabolic panel BUN/creatinine and hemoglobin hematocrit. History of hypertension we will keep patient n.p.o. and IV labetalol. History of postoperative hypothyroidism on Synthroid which we will dose as IV. CT  scan also shows cyst in the distal rectal area.  Follow-up as outpatient.  Since patient has acute gallstone pancreatitis with multiple gallstones in the distal common bile duct will need further work-up management and inpatient status.   DVT prophylaxis: SCDs.  Avoiding anticoagulation anticipation of possible procedure including ERCP and surgery. Code Status: Full code. Family Communication: Discussed with patient. Disposition Plan: Home. Consults called: We will consult general surgery and gastroenterology. Admission status: Inpatient.   Rise Patience MD [facility] Pager [phone].  If 7PM-7AM, please contact night-coverage [email or web address] Password [email or web address]  04/20/2021, 2:22 AM

## 2021-04-20 NOTE — Progress Notes (Signed)
Pharmacy Antibiotic Note  [name] [name] is a 86 y.o. female admitted on 04/19/2021 with r/o acute cholecystitis.  Pharmacy has been consulted for Zosyn dosing.  Patient with PCN allergy (reaction = rash).  Received Zosyn 3.375gm IV x 1 @ 00:21.  Contacted RN who reported patient tolerated Zosyn with no adverse side effects.  Plan: Zosyn 3.375g IV q8h (4 hour infusion). Follow renal function  Height: 5' 5.5" (166.4 cm) Weight: 59 kg (130 lb) IBW/kg (Calculated) : 58.15  Temp (24hrs), Avg:97.8 F (36.6 C), Min:97.8 F (36.6 C), Max:97.8 F (36.6 C)  Recent Labs  Lab 04/19/21 2120 04/20/21 0004  WBC 15.3*  --   CREATININE 0.55  --   LATICACIDVEN  --  4.3*    Estimated Creatinine Clearance: 49 mL/min (by C-G formula based on SCr of 0.55 mg/dL).    Allergies  Allergen Reactions   Horse-Derived Products     Redness & swelling of arm   Other Anaphylaxis    Tree nuts (not peanuts)  Other reaction(s): Other (See Comments) Uncoded Allergy. Allergen: eggs, Other Reaction: red and swelling   Penicillins     Rash    Shellfish Allergy Other (See Comments)    Other Reaction: throat closes up   Shellfish-Derived Products     Throat swelling   Bacitracin-Polymyxin B     Local swelling   Eggs Or Egg-Derived Products     REACTION: Unable to get Flu Vaccine Skin Tests+ for egg products   Neomycin-Bacitracin Zn-Polymyx     Antimicrobials this admission: 8/16 Zosyn >>      Dose adjustments this admission:    Microbiology results:   Thank you for allowing pharmacy to be a part of this patient's care.  Everette Rank, PharmD 04/20/2021 2:35 AM

## 2021-04-20 NOTE — Consult Note (Addendum)
[facility] Surgery Consult Note  [name] [name] 07/10/1935  [id number].    Requesting MD: Jacki Cones Chief Complaint: Abdominal pain Reason for Consult: Gallstone pancreatitis PCP  [name] Morale, MD  HPI:  Patient is an 86 year old female who presented to the ED last evening complaining of abdominal pain that started about 2 PM.  She had no other symptoms.  She denied nausea vomiting or diarrhea her last BM was yesterday.  She denied any chest pain, but said the pain is worse than labor pains.  EKG showed sinus rhythm with multiple PVCs left atrial enlargement.  She reports she had some symptoms about a week ago that she thought was related to her hiatal hernia.  She was supposed to go back for an esophageal dilatation but lives alone and did not have anyone to take her home.  Work-up shows she is afebrile vital signs were stable.  Admission labs shows a sodium of 130, potassium of 3.4, chloride of 92, glucose 148, lipase 4307, AST 592, ALT 231, total bilirubin 1.6.  WBC 15.3, hemoglobin 14.2, hematocrit 41.1, platelets 272,000.  Lactate 4.3.  Respiratory panel was negative, urinalysis 11-20 WBC.  CT of the abdomen pelvis shows choledocholithiasis with multiple distal common bile duct stones intra and extrahepatic biliary ductal dilatation and gallbladder distention.  Mild gallbladder wall thickening.  Multiple intraluminal gallstones.  Mild peripancreatic stranding about the distal body and tail suspicious for gallstone pancreatitis.  There was no pancreatic ductal dilatation or evidence of pancreatic mass.  There is colonic diverticulosis without diverticulitis, small hiatal hernia within the distal esophagus suggesting reflux, right lower lobe pulmonary nodules largest measuring 5 mm.  There is also a benign cyst posterior to the rectum described on a remote 2001 pelvic MRI considered benign.  Repeat labs this a.m. shows a sodium of 131, glucose 149, AST 606, ALT 372, total  bilirubin 2.6.  Lactate to 3.9.  WBC 20 point 8H/H11.9/33.7.  Additional medical problems include a history of hypertension, HX thyroid cancer with total thyroidectomy postoperative hypothyroidism on Synthroid.  Hx IBS, esophageal stricture, followed past by Dr. Delfin Edis, Hx hyperlipidemia.  Hx exploratory laparotomy for posterior rectal cyst.  ROS: Review of Systems  Constitutional: Negative.   HENT:  Positive for hearing loss.   Eyes: Negative.        She wears reading glasses  Respiratory: Negative.    Cardiovascular: Negative.   Gastrointestinal:  Positive for abdominal pain, diarrhea (Occasional) and heartburn. Negative for blood in stool, constipation, melena, nausea and vomiting.  Genitourinary:        History of prolapsed uterus with some difficulty voiding  Musculoskeletal: Negative.   Skin: Negative.   Neurological: Negative.   Endo/Heme/Allergies: Negative.   Psychiatric/Behavioral: Negative.     Family History  Problem Relation Age of Onset   Parkinsonism Brother    Hypertension Mother    Prostate cancer Father    Breast cancer Maternal Grandmother    Hypertension Maternal Grandmother    Heart attack Paternal Uncle 41   Heart attack Paternal Grandfather 68    Past Medical History:  Diagnosis Date   Allergic angioedema    from shellfish and tree nuts   Chest pain 2000 and 2001   negative cardiac workup   Complication of anesthesia    Diverticulosis    Fibrocystic breast disease    Hyperlipidemia    Hypertension    Irritable bowel syndrome    Perirectal cyst    ?lymphocoele   PMR (polymyalgia  rheumatica) ([facility]) 2011   PONV (postoperative nausea and vomiting)    Seborrheic dermatitis    Squamous cell skin cancer    X1, [facility]   Thyroid disease    post op hypothyroidism; TSH goal would be sup;pressive    Past Surgical History:  Procedure Laterality Date   COLONOSCOPY  2004   per Dr. Olevia Perches, clear but incomplete; Barium study added, repeat in 10 yrs     DILATION AND CURETTAGE OF UTERUS     ESOPHAGOGASTRODUODENOSCOPY (EGD) WITH PROPOFOL N/A 07/21/2018   Procedure: ESOPHAGOGASTRODUODENOSCOPY (EGD) WITH PROPOFOL;  Surgeon: Jackquline Denmark, MD;  Location: [facility]COPY;  Service: Endoscopy;  Laterality: N/A;   exporatory surgery for rectal mass  1986   peri rectal cyst   FOREIGN BODY REMOVAL  07/21/2018   Procedure: FOREIGN BODY REMOVAL;  Surgeon: Jackquline Denmark, MD;  Location: [facility] END[facility]COPY;  Service: Endoscopy;;   THYROIDECTOMY  1988   S/P radiation previously seen by Liliane Bade, MD @ [facility], now followed by Dr. Leonides Schanz   TONSILLECTOMY  1972    Social History:  reports that she has never smoked. She has never used smokeless tobacco. She reports current alcohol use. She reports that she does not use drugs.  Patient lives alone.  Her son is in [place]. Tobacco: None Drugs: None EtOH: None   Allergies:  Allergies  Allergen Reactions   Horse-Derived Products     Redness & swelling of arm   Other Anaphylaxis    Tree nuts (not peanuts)  Other reaction(s): Other (See Comments) Uncoded Allergy. Allergen: eggs, Other Reaction: red and swelling   Penicillins     Rash    Shellfish Allergy Other (See Comments)    Other Reaction: throat closes up   Shellfish-Derived Products     Throat swelling   Bacitracin-Polymyxin B     Local swelling   Eggs Or Egg-Derived Products     REACTION: Unable to get Flu Vaccine Skin Tests+ for egg products   Neomycin-Bacitracin Zn-Polymyx     Prior to Admission medications   Medication Sig Start Date End Date Taking? Authorizing Provider  citalopram (CELEXA) 20 MG tablet TAKE ONE-HALF TABLET BY MOUTH DAILY AS DIRECTED 07/13/20  Yes [name] Morale, MD  diphenhydrAMINE (BENADRYL) 25 mg capsule Take 25 mg by mouth as needed.     Yes [provider]  EPINEPHrine 0.3 mg/0.3 mL IJ SOAJ injection Inject 0.3 mg into the muscle as needed for anaphylaxis. 06/15/20  Yes [name] Morale, MD   levothyroxine (SYNTHROID) 88 MCG tablet TAKE 1 TABLET(88 MCG) BY MOUTH DAILY 06/15/20  Yes [name] Morale, MD  metoprolol tartrate (LOPRESSOR) 100 MG tablet TAKE 1 TABLET BY MOUTH EVERY MORNING, AND 1/2 TABLET EVERY EVENING Patient taking differently: Take by mouth 2 (two) times daily. TAKE 1 TABLET BY MOUTH EVERY MORNING, AND 1/2 TABLET EVERY EVENING 08/10/20  Yes [name] Morale, MD  omeprazole (PRILOSEC) 20 MG capsule TAKE 1 CAPSULE(20 MG) BY MOUTH DAILY 06/15/20  Yes [name] Morale, MD  pravastatin (PRAVACHOL) 40 MG tablet Take 1 tablet (40 mg total) by mouth daily. Patient taking differently: Take 20 mg by mouth 2 (two) times daily. 06/15/20  Yes [name] Morale, MD  spironolactone (ALDACTONE) 25 MG tablet TAKE 1 TABLET(25 MG) BY MOUTH DAILY 10/20/20  Yes [name] Morale, MD  nitrofurantoin, macrocrystal-monohydrate, (MACROBID) 100 MG capsule Take 1 capsule (100 mg total) by mouth 2 (two) times daily. Patient not taking: Reported on 04/20/2021 06/23/20  [name] Morale, MD     Blood pressure (!) 106/56, pulse 78, temperature 97.8 F (36.6 C), temperature source Oral, resp. rate 18, height 5' 5.5" (1.664 m), weight 59 kg, SpO2 91 %. Physical Exam:  General: pleasant, WD, WN white female who is laying in bed in NAD HEENT: head is normocephalic, atraumatic.  Sclera are noninjected.  Pupils are equal.  Ears and nose without any masses or lesions.  Mouth is pink and moist Heart: regular, rate, and rhythm.  Normal s1,s2. No obvious murmurs, gallops, or rubs noted.  Palpable radial and pedal pulses bilaterally Lungs: CTAB, no wheezes, rhonchi, or rales noted.  Respiratory effort nonlabored Abd: soft, she points to the right upper quadrant as a source of her pain.  And still says that the most tender area.  She denied any tenderness over the remainder of her abdomen., ND, +BS, no masses, hernias, or organomegaly.  She has a lower midline abdominal surgical scar.  This is well-healed. MS: all 4  extremities are symmetrical with no cyanosis, clubbing, or edema. Skin: warm and dry with no masses, lesions, or rashes Neuro: Cranial nerves 2-12 grossly intact, sensation is normal throughout Psych: A&Ox3 with an appropriate affect.   Results for orders placed or performed during the hospital encounter of 04/19/21 (from the past 48 hour(s))  Lipase, blood     Status: Abnormal   Collection Time: 04/19/21  9:20 PM  Result Value Ref Range   Lipase 4,307 (H) 11 - 51 U/L    Comment: RESULTS CONFIRMED BY MANUAL DILUTION Performed at [facility] [address], [place], [place] [address]   Comprehensive metabolic panel     Status: Abnormal   Collection Time: 04/19/21  9:20 PM  Result Value Ref Range   Sodium 130 (L) 135 - 145 mmol/L   Potassium 3.4 (L) 3.5 - 5.1 mmol/L   Chloride 92 (L) 98 - 111 mmol/L   CO2 26 22 - 32 mmol/L   Glucose, Bld 148 (H) 70 - 99 mg/dL    Comment: Glucose reference range applies only to samples taken after fasting for at least 8 hours.   BUN 10 8 - 23 mg/dL   Creatinine, Ser 0.55 0.44 - 1.00 mg/dL   Calcium 9.4 8.9 - 10.3 mg/dL   Total Protein 7.7 6.5 - 8.1 g/dL   Albumin 4.6 3.5 - 5.0 g/dL   AST 592 (H) 15 - 41 U/L   ALT 231 (H) 0 - 44 U/L   Alkaline Phosphatase 113 38 - 126 U/L   Total Bilirubin 1.6 (H) 0.3 - 1.2 mg/dL   GFR, Estimated >60 >60 mL/min    Comment: (NOTE) Calculated using the CKD-EPI Creatinine Equation (2021)    Anion gap 12 5 - 15    Comment: Performed at [facility], [place] [address] [place], [place] [address]  CBC     Status: Abnormal   Collection Time: 04/19/21  9:20 PM  Result Value Ref Range   WBC 15.3 (H) 4.0 - 10.5 K/uL   RBC 4.48 3.87 - 5.11 MIL/uL   Hemoglobin 14.2 12.0 - 15.0 g/dL   HCT 41.1 36.0 - 46.0 %   MCV 91.7 80.0 - 100.0 fL   MCH 31.7 26.0 - 34.0 pg   MCHC 34.5 30.0 - 36.0 g/dL   RDW 11.6 11.5 - 15.5 %   Platelets 272 150 - 400 K/uL   nRBC 0.0 0.0 - 0.2 %    Comment:  Performed  at [facility], [place] [address] [place], [place] [address]  Urinalysis, Routine w reflex microscopic Urine, Clean Catch     Status: Abnormal   Collection Time: 04/20/21 12:04 AM  Result Value Ref Range   Color, Urine YELLOW YELLOW   APPearance CLEAR CLEAR   Specific Gravity, Urine <1.005 (L) 1.005 - 1.030   pH 6.0 5.0 - 8.0   Glucose, UA NEGATIVE NEGATIVE mg/dL   Hgb urine dipstick SMALL (A) NEGATIVE   Bilirubin Urine NEGATIVE NEGATIVE   Ketones, ur NEGATIVE NEGATIVE mg/dL   Protein, ur NEGATIVE NEGATIVE mg/dL   Nitrite NEGATIVE NEGATIVE   Leukocytes,Ua SMALL (A) NEGATIVE    Comment: Performed at [facility], [place] [address], [place], [place] [address]  Lactic acid, plasma     Status: Abnormal   Collection Time: 04/20/21 12:04 AM  Result Value Ref Range   Lactic Acid, Venous 4.3 (HH) 0.5 - 1.9 mmol/L    Comment: CRITICAL RESULT CALLED TO, READ BACK BY AND VERIFIED WITH: HAILEY, RN @ 0129 ON 04/20/21 Sandy Salaam Performed at [facility], [place] [address], [place], [place] [address]   Resp Panel by RT-PCR (Flu A&B, Covid) Nasopharyngeal Swab     Status: None   Collection Time: 04/20/21 12:04 AM   Specimen: Nasopharyngeal Swab; Nasopharyngeal(NP) swabs in vial transport medium  Result Value Ref Range   SARS Coronavirus 2 by RT PCR NEGATIVE NEGATIVE    Comment: (NOTE) SARS-CoV-2 target nucleic acids are NOT DETECTED.  The SARS-CoV-2 RNA is generally detectable in upper respiratory specimens during the acute phase of infection. The lowest concentration of SARS-CoV-2 viral copies this assay can detect is 138 copies/mL. A negative result does not preclude SARS-Cov-2 infection and should not be used as the sole basis for treatment or other patient management decisions. A negative result may occur with  improper specimen collection/handling, submission of specimen other than nasopharyngeal swab, presence of viral mutation(s)  within the areas targeted by this assay, and inadequate number of viral copies(<138 copies/mL). A negative result must be combined with clinical observations, patient history, and epidemiological information. The expected result is Negative.  Fact Sheet for Patients:  EntrepreneurPulse.com.au  Fact Sheet for Healthcare Providers:  IncredibleEmployment.be  This test is no t yet approved or cleared by the [place] FDA and  has been authorized for detection and/or diagnosis of SARS-CoV-2 by FDA under an Emergency Use Authorization (EUA). This EUA will remain  in effect (meaning this test can be used) for the duration of the COVID-19 declaration under Section 564(b)(1) of the Act, 21 U.S.C.section 360bbb-3(b)(1), unless the authorization is terminated  or revoked sooner.       Influenza A by PCR NEGATIVE NEGATIVE   Influenza B by PCR NEGATIVE NEGATIVE    Comment: (NOTE) The Xpert Xpress SARS-CoV-2/FLU/RSV plus assay is intended as an aid in the diagnosis of influenza from Nasopharyngeal swab specimens and should not be used as a sole basis for treatment. Nasal washings and aspirates are unacceptable for Xpert Xpress SARS-CoV-2/FLU/RSV testing.  Fact Sheet for Patients: EntrepreneurPulse.com.au  Fact Sheet for Healthcare Providers: IncredibleEmployment.be  This test is not yet approved or cleared by the [place] FDA and has been authorized for detection and/or diagnosis of SARS-CoV-2 by FDA under an Emergency Use Authorization (EUA). This EUA will remain in effect (meaning this test can be used) for the duration of the COVID-19 declaration under Section 564(b)(1) of the Act, 21 U.S.C. section 360bbb-3(b)(1), unless the  authorization is terminated or revoked.  Performed at [facility], [place] [address], [place], [place] [address]   Urinalysis, Microscopic (reflex)     Status:  Abnormal   Collection Time: 04/20/21 12:04 AM  Result Value Ref Range   RBC / HPF 0-5 0 - 5 RBC/hpf   WBC, UA 11-20 0 - 5 WBC/hpf   Bacteria, UA RARE (A) NONE SEEN   Squamous Epithelial / LPF 6-10 0 - 5    Comment: Performed at [facility], [place] [address], [place], [place] [address]  Lactic acid, plasma     Status: Abnormal   Collection Time: 04/20/21  2:01 AM  Result Value Ref Range   Lactic Acid, Venous 4.0 (HH) 0.5 - 1.9 mmol/L    Comment: CRITICAL VALUE NOTED.  VALUE IS CONSISTENT WITH PREVIOUSLY REPORTED AND CALLED VALUE. Performed at [facility], [place] [address], [place], [place] [address]   Hepatic function panel     Status: Abnormal   Collection Time: 04/20/21  4:12 AM  Result Value Ref Range   Total Protein 5.9 (L) 6.5 - 8.1 g/dL   Albumin 3.4 (L) 3.5 - 5.0 g/dL   AST 606 (H) 15 - 41 U/L   ALT 372 (H) 0 - 44 U/L   Alkaline Phosphatase 104 38 - 126 U/L   Total Bilirubin 2.6 (H) 0.3 - 1.2 mg/dL   Bilirubin, Direct 1.5 (H) 0.0 - 0.2 mg/dL   Indirect Bilirubin 1.1 (H) 0.3 - 0.9 mg/dL    Comment: Performed at [facility], [place] [address], [place], [place] [id number]  Basic metabolic panel     Status: Abnormal   Collection Time: 04/20/21  4:12 AM  Result Value Ref Range   Sodium 131 (L) 135 - 145 mmol/L   Potassium 3.8 3.5 - 5.1 mmol/L   Chloride 95 (L) 98 - 111 mmol/L   CO2 26 22 - 32 mmol/L   Glucose, Bld 149 (H) 70 - 99 mg/dL    Comment: Glucose reference range applies only to samples taken after fasting for at least 8 hours.   BUN 9 8 - 23 mg/dL   Creatinine, Ser 0.59 0.44 - 1.00 mg/dL   Calcium 8.7 (L) 8.9 - 10.3 mg/dL   GFR, Estimated >60 >60 mL/min    Comment: (NOTE) Calculated using the CKD-EPI Creatinine Equation (2021)    Anion gap 10 5 - 15    Comment: Performed at [facility], [place] [address], [place], [place] [address]  CBC WITH DIFFERENTIAL     Status: Abnormal   Collection Time:  04/20/21  4:12 AM  Result Value Ref Range   WBC 20.8 (H) 4.0 - 10.5 K/uL   RBC 3.68 (L) 3.87 - 5.11 MIL/uL   Hemoglobin 11.9 (L) 12.0 - 15.0 g/dL   HCT 33.7 (L) 36.0 - 46.0 %   MCV 91.6 80.0 - 100.0 fL   MCH 32.3 26.0 - 34.0 pg   MCHC 35.3 30.0 - 36.0 g/dL   RDW 11.7 11.5 - 15.5 %   Platelets 204 150 - 400 K/uL   nRBC 0.0 0.0 - 0.2 %   Neutrophils Relative % 90 %   Neutro Abs 18.8 (H) 1.7 - 7.7 K/uL   Lymphocytes Relative 4 %   Lymphs Abs 0.7 0.7 - 4.0 K/uL   Monocytes Relative 5 %   Monocytes Absolute 1.0 0.1 - 1.0 K/uL   Eosinophils Relative 0 %   Eosinophils Absolute  0.0 0.0 - 0.5 K/uL   Basophils Relative 0 %   Basophils Absolute 0.0 0.0 - 0.1 K/uL   Immature Granulocytes 1 %   Abs Immature Granulocytes 0.23 (H) 0.00 - 0.07 K/uL    Comment: Performed at [facility], [place] [address] [place], [place] [address]   CT ABDOMEN PELVIS W CONTRAST  Result Date: 04/19/2021 CLINICAL DATA:  Abdominal distension.  Upper abdominal pain. EXAM: CT ABDOMEN AND PELVIS WITH CONTRAST TECHNIQUE: Multidetector CT imaging of the abdomen and pelvis was performed using the standard protocol following bolus administration of intravenous contrast. CONTRAST:  89m OMNIPAQUE IOHEXOL 350 MG/ML SOLN COMPARISON:  None. FINDINGS: Lower chest: A 5 mm right lower lobe pulmonary nodule is only partially included in the field of view, image 1 series 6. There is a 4 mm subpleural nodule in the right lower lobe, series 6, image 5. No pleural effusion. There is fluid within the distal esophagus with small hiatal hernia. Hepatobiliary: There is intra and extrahepatic biliary ductal dilatation with multiple distal common bile duct stones. At least 6 stones are seen within the mid and distal common bile duct, measuring approximately 5 mm. Common bile duct measures 14 mm. Gallbladder is distended and contains innumerable intraluminal gallstones. There is mild gallbladder wall thickening. Prominent  intrahepatic biliary ductal dilatation. Occasional small hepatic cysts. No suspicious liver lesion. Pancreas: Mild peripancreatic fat stranding about the distal body and tail. There is no pancreatic ductal dilatation. No evidence of pancreatic mass. Spleen: Normal in size without focal abnormality. Adrenals/Urinary Tract: There is a 13 mm left adrenal nodule, nonspecific. Normal right adrenal gland. No hydronephrosis or perinephric edema. Homogeneous renal enhancement with symmetric excretion on delayed phase imaging. Urinary bladder is physiologically distended without wall thickening. Stomach/Bowel: Small hiatal hernia distended with intraluminal fluid. Fluid-filled stomach. No gastric wall thickening. No duodenal wall thickening. There is a small periampullary duodenal diverticulum. Decompressed small bowel. Appendix is not confidently visualized, no appendicitis. Moderate volume of colonic stool. The sigmoid colon is redundant. There is prominent diverticulosis involving the distal descending and sigmoid colon. No diverticulitis. Cystic structure posterior to the rectum measures 7.0 x 3.9 cm, this was described on remote 2001 pelvic MRI and is considered benign. Vascular/Lymphatic: Moderate aortic atherosclerosis. No aortic aneurysm. Patent portal vein. No acute vascular findings. No abdominopelvic adenopathy. Reproductive: Atrophic uterus. There is a 12 mm cyst in the right ovary. No follow-up imaging recommended. Note: This recommendation does not apply to premenarchal patients and to those with increased risk (genetic, family history, elevated tumor markers or other high-risk factors) of ovarian cancer. Reference: JACR 2020 Feb; 17(2):248-254 Other: Cyst posterior to the rectum as described above. No free air or ascites. No abdominal wall hernia. Musculoskeletal: There are no acute or suspicious osseous abnormalities. Age related degenerative change in the spine. IMPRESSION: 1. Choledocholithiasis with  multiple distal common bile duct stones, intra and extrahepatic biliary ductal dilatation, and gallbladder distention. Mild gallbladder wall thickening. Multiple intraluminal gallstones. While MRCP is often recommended to assess the biliary tree, stones are readily apparent on CT, and MRCP is not recommended in this elderly patient, as choledocholithiasis is well visualized by CT. 2. Mild peripancreatic fat stranding about the distal body and tail, suspicious for gallstone pancreatitis. There is no pancreatic ductal dilatation or evidence of pancreatic mass. 3. Colonic diverticulosis without diverticulitis. 4. Small hiatal hernia with fluid within the distal esophagus, suggesting reflux. 5. Right lower lobe pulmonary nodules, largest measuring 5 mm. No follow-up needed if  patient is low-risk (and has no known or suspected primary neoplasm). Non-contrast chest CT can be considered in 12 months if patient is high-risk. This recommendation follows the consensus statement: Guidelines for Management of Incidental Pulmonary Nodules Detected on CT Images: From the Fleischner Society 2017; Radiology 2017; 284:228-243. 6. A 13 mm left adrenal nodule is nonspecific. 7. Benign cyst posterior to the rectum, described on remote 2001 pelvic MRI and considered benign. Aortic Atherosclerosis (ICD10-I70.0). Electronically Signed   By: Keith Rake M.D.   On: 04/19/2021 23:42    lactated ringers 200 mL/hr at 04/20/21 0229   piperacillin-tazobactam (ZOSYN)  IV        Assessment/Plan  Gallstone pancreatitis/choledocholithiasis  FEN: N.p.o./IV fluids ID: Zosyn DVT: SCDs added  Leukocytosis  -WBC 15.3>> 20.8 Elevated LFTs  - AST 592>> 606  - ALT 231>> 372 Total bilirubin   - 1.6>> 2.6 Hyponatremia  -NA 131 Hx thyroid cancer with total thyroidectomy Hypothyroid  - on Synthroid Hx IBS  -Colonoscopy 02/06/2003 Hx esophageal stricture -07/21/2018 Hypertension Hypercholesterolemia  Plan:  Pt with  choledocholithiasis and pancreatitis.  She has a history of IBD, she has had prior colonoscopies by Dr. Delfin Edis, she has had an esophageal stricture with esophagoscopy and food bolus removal by Dr. Lyndel Safe.  I have reviewed with GI they will see her.  A repeat lipase is ordered for today and labs are also ordered for tomorrow.  Plans are tentatively for her to resolve her pancreatitis, undergo ERCP with stone removal, and then laparoscopic cholecystectomy.  We will follow with you.  I would keep her n.p.o. except for ice chips and sips for oral comfort.  I agree with IV Zosyn for choledocholithiasis.  lipase  4307>>1041   Earnstine Regal [facility] Surgery 04/20/2021, 7:21 AM Please see Amion for pager number during day hours 7:00am-4:30pm

## 2021-04-20 NOTE — Progress Notes (Signed)
This patient was admitted by my partner this morning.  Patient was seen and examined in the ER.  Essentially this is a 86 year old female admitted with gallstone pancreatitis.  Surgery and GI are consulted. Leukocytosis improved.  Acidosis resolved with IV fluids and Zosyn. LR changed to NS as LR was not compatible with Zosyn. ERCP per GI. Follow-up labs in AM.

## 2021-04-20 NOTE — Consult Note (Addendum)
Referring Provider: Franchot Heidelberg PA-C Primary Care Physician:  [name], MD Primary Gastroenterologist:  Dr. Lyndel Safe, previously Dr. Olevia Perches   Reason for Consultation: pancreatitis,  choledocholithiasis  HPI: [name] [name] is a 86 y.o. female with a past medical history of hypertension, hyperlipidemia, thyroid cancer s/p thyroidectomy and radiation resulting in hypothyroidism, GERD, esophageal stricture, diverticulosis, perirectal cyst and irritable bowel syndrome.  She developed RUQ pain with mild nausea and no vomiting around 2 PM on 04/19/2021 which progressively worsened throughout the day so she presented to [facility] ED for further evaluation.  Labs in the ED showed a WBC level 15.3.  Hemoglobin 14.2.  Platelet 272.  Alk phos 113.  Total bili 1.6.  AST 592.  ALT 231.  Lipase 4307.  Sodium 130.  Potassium 3.4.  Glucose 148.  BUN 10.  Creatinine 0.55.  Calcium 9.4.  Albumin 4.6.  Lactic acid 4.3.  SARS coronavirus 2 negative.  Urine bilirubin level negative.  CTAP with contrast notified choledocholithiasis with multiple distal common bile duct stones, intra and extrahepatic biliary ductal dilatation, gallbladder wall distention and gallbladder wall thickening with multiple intraluminal gallstones.  Mild peripancreatic fat stranding about the distal body and tail of the pancreas concerning for gallstone pancreatitis.  A small hiatal hernia with fluid within the distal esophagus suggestive of reflux.  A right lower lobe pulmonary nodule measuring 5 mm.  A benign appearing rectal cyst was also noted.  Her RUQ pain is much less at this time.  No nausea or vomiting.  No prior history of pancreatitis.  She takes Omeprazole 20 mg daily for a prior history of GERD and esophageal stricture identified per EGD done by Dr. Lyndel Safe due to having a food bolus impaction 07/2018.  A repeat EGD with esophageal dilatation was planned, however, she was unable to schedule the procedure she did not  have transportation.  No recent heartburn or dysphagia.  She takes Aleve one half tab 3 days weekly for arthritis pain.  She occasionally takes aspirin 325 mg one half tab as needed as well.  No alcohol use.  She is passing a soft brown stool most days.  No rectal bleeding or melena.  Her last colonoscopy was in 2004 by Dr. Maurene Capes which was incomplete at the hepatic flexure due to adhesions, sigmoid diverticulosis was identified without evidence of polyps within the evaluated colon.  No fever, sweats or chills.  No weight loss.  CTAP with contrast 04/20/2021: 1. Choledocholithiasis with multiple distal common bile duct stones, intra and extrahepatic biliary ductal dilatation, and gallbladder distention. Mild gallbladder wall thickening. Multiple intraluminal gallstones. While MRCP is often recommended to assess the biliary tree, stones are readily apparent on CT, and MRCP is not recommended in this elderly patient, as choledocholithiasis is well visualized by CT. 2. Mild peripancreatic fat stranding about the distal body and tail, suspicious for gallstone pancreatitis. There is no pancreatic ductal dilatation or evidence of pancreatic mass. 3. Colonic diverticulosis without diverticulitis. 4. Small hiatal hernia with fluid within the distal esophagus, suggesting reflux. 5. Right lower lobe pulmonary nodules, largest measuring 5 mm. No follow-up needed if patient is low-risk (and has no known or suspected primary neoplasm). Non-contrast chest CT can be considered in 12 months if patient is high-risk. This recommendation follows the consensus statement: Guidelines for Management of Incidental Pulmonary Nodules Detected on CT Images: From the Fleischner Society 2017; Radiology 2017; 284:228-243. 6. A 13 mm left adrenal nodule is nonspecific. 7. Benign cyst posterior  to the rectum, described on remote 2001 pelvic MRI and considered benign.  Aortic Atherosclerosis   Past GI procedures:  EGD  07/21/2018 Dr. Lyndel Safe: -Food impaction status post successful endoscopic disimpaction. -Underlying distal esophageal stricture with moderate esophagitis.  Colonoscopy 02/06/2003 by Dr. Olevia Perches: Incomplete to the hepatic flexure thought to be due to adhesions Sigmoid diverticulosis  Past Medical History:  Diagnosis Date   Allergic angioedema    from shellfish and tree nuts   Chest pain 2000 and 2001   negative cardiac workup   Complication of anesthesia    Diverticulosis    Fibrocystic breast disease    Hyperlipidemia    Hypertension    Irritable bowel syndrome    Perirectal cyst    ?lymphocoele   PMR (polymyalgia rheumatica) ([place]) 2011   PONV (postoperative nausea and vomiting)    Seborrheic dermatitis    Squamous cell skin cancer    X1, [facility]   Thyroid disease    post op hypothyroidism; TSH goal would be sup;pressive    Past Surgical History:  Procedure Laterality Date   COLONOSCOPY  2004   per Dr. Olevia Perches, clear but incomplete; Barium study added, repeat in 10 yrs    DILATION AND CURETTAGE OF UTERUS     ESOPHAGOGASTRODUODENOSCOPY (EGD) WITH PROPOFOL N/A 07/21/2018   Procedure: ESOPHAGOGASTRODUODENOSCOPY (EGD) WITH PROPOFOL;  Surgeon: Jackquline Denmark, MD;  Location: [facility] ENDOSCOPY;  Service: Endoscopy;  Laterality: N/A;   exporatory surgery for rectal mass  1986   peri rectal cyst   FOREIGN BODY REMOVAL  07/21/2018   Procedure: FOREIGN BODY REMOVAL;  Surgeon: Jackquline Denmark, MD;  Location: [facility] ENDOSCOPY;  Service: Endoscopy;;   THYROIDECTOMY  1988   S/P radiation previously seen by Liliane Bade, MD @ [facility], now followed by Dr. Leonides Schanz   TONSILLECTOMY  1972    Prior to Admission medications   Medication Sig Start Date End Date Taking? Authorizing Provider  citalopram (CELEXA) 20 MG tablet TAKE ONE-HALF TABLET BY MOUTH DAILY AS DIRECTED 07/13/20  Yes [name], MD  diphenhydrAMINE (BENADRYL) 25 mg capsule Take 25 mg by mouth as needed.     Yes [provider]  EPINEPHrine 0.3 mg/0.3 mL IJ SOAJ injection Inject 0.3 mg into the muscle as needed for anaphylaxis. 06/15/20  Yes [name], MD  levothyroxine (SYNTHROID) 88 MCG tablet TAKE 1 TABLET(88 MCG) BY MOUTH DAILY 06/15/20  Yes [name], MD  metoprolol tartrate (LOPRESSOR) 100 MG tablet TAKE 1 TABLET BY MOUTH EVERY MORNING, AND 1/2 TABLET EVERY EVENING Patient taking differently: Take by mouth 2 (two) times daily. TAKE 1 TABLET BY MOUTH EVERY MORNING, AND 1/2 TABLET EVERY EVENING 08/10/20  Yes [name], MD  omeprazole (PRILOSEC) 20 MG capsule TAKE 1 CAPSULE(20 MG) BY MOUTH DAILY 06/15/20  Yes [name], MD  pravastatin (PRAVACHOL) 40 MG tablet Take 1 tablet (40 mg total) by mouth daily. Patient taking differently: Take 20 mg by mouth 2 (two) times daily. 06/15/20  Yes [name], MD  spironolactone (ALDACTONE) 25 MG tablet TAKE 1 TABLET(25 MG) BY MOUTH DAILY 10/20/20  Yes [name], MD  nitrofurantoin, macrocrystal-monohydrate, (MACROBID) 100 MG capsule Take 1 capsule (100 mg total) by mouth 2 (two) times daily. Patient not taking: Reported on 04/20/2021 06/23/20   [name], MD    Current Facility-Administered Medications  Medication Dose Route Frequency Provider Last Rate Last Admin   lactated ringers infusion   Intravenous Continuous Rise Patience, MD  Paused at 04/20/21 0745   levothyroxine (SYNTHROID, LEVOTHROID) injection 44 mcg  44 mcg Intravenous Daily Rise Patience, MD       morphine 2 MG/ML injection 2 mg  2 mg Intravenous Q2H PRN Rise Patience, MD   2 mg at 04/20/21 0257   ondansetron (ZOFRAN) injection 4 mg  4 mg Intravenous Q6H PRN Rise Patience, MD       piperacillin-tazobactam (ZOSYN) IVPB 3.375 g  3.375 g Intravenous Q8H Poindexter, Leann T, RPH 12.5 mL/hr at 04/20/21 0757 3.375 g at 04/20/21 0757   Current Outpatient Medications  Medication Sig Dispense Refill   citalopram (CELEXA) 20 MG tablet TAKE ONE-HALF TABLET BY  MOUTH DAILY AS DIRECTED 135 tablet 1   diphenhydrAMINE (BENADRYL) 25 mg capsule Take 25 mg by mouth as needed.       EPINEPHrine 0.3 mg/0.3 mL IJ SOAJ injection Inject 0.3 mg into the muscle as needed for anaphylaxis. 1 each 5   levothyroxine (SYNTHROID) 88 MCG tablet TAKE 1 TABLET(88 MCG) BY MOUTH DAILY 90 tablet 3   metoprolol tartrate (LOPRESSOR) 100 MG tablet TAKE 1 TABLET BY MOUTH EVERY MORNING, AND 1/2 TABLET EVERY EVENING (Patient taking differently: Take by mouth 2 (two) times daily. TAKE 1 TABLET BY MOUTH EVERY MORNING, AND 1/2 TABLET EVERY EVENING) 135 tablet 3   omeprazole (PRILOSEC) 20 MG capsule TAKE 1 CAPSULE(20 MG) BY MOUTH DAILY 90 capsule 3   pravastatin (PRAVACHOL) 40 MG tablet Take 1 tablet (40 mg total) by mouth daily. (Patient taking differently: Take 20 mg by mouth 2 (two) times daily.) 90 tablet 3   spironolactone (ALDACTONE) 25 MG tablet TAKE 1 TABLET(25 MG) BY MOUTH DAILY 90 tablet 1   nitrofurantoin, macrocrystal-monohydrate, (MACROBID) 100 MG capsule Take 1 capsule (100 mg total) by mouth 2 (two) times daily. (Patient not taking: Reported on 04/20/2021) 14 capsule 0    Allergies as of 04/19/2021 - Review Complete 04/19/2021  Allergen Reaction Noted   Horse-derived products     Other Anaphylaxis 11/19/2013   Penicillins     Shellfish allergy Other (See Comments) 06/10/2019   Shellfish-derived products  12/01/2010   Bacitracin-polymyxin b     Eggs or egg-derived products     Neomycin-bacitracin zn-polymyx      Family History  Problem Relation Age of Onset   Parkinsonism Brother    Hypertension Mother    Prostate cancer Father    Breast cancer Maternal Grandmother    Hypertension Maternal Grandmother    Heart attack Paternal Uncle 27   Heart attack Paternal Grandfather 65    Social History   Socioeconomic History   Marital status: Single    Spouse name: Not on file   Number of children: Not on file   Years of education: Not on file   Highest education  level: Not on file  Occupational History   Not on file  Tobacco Use   Smoking status: Never   Smokeless tobacco: Never  Substance and Sexual Activity   Alcohol use: Yes    Alcohol/week: 0.0 standard drinks    Comment: very rarely   Drug use: No   Sexual activity: Not on file  Other Topics Concern   Not on file  Social History Narrative   Not on file   Social Determinants of Health   Financial Resource Strain: Low Risk    Difficulty of Paying Living Expenses: Not hard at all  Food Insecurity: No Food Insecurity   Worried About Running Out of  Food in the Last Year: Never true   [place] in the Last Year: Never true  Transportation Needs: No Transportation Needs   Lack of Transportation (Medical): No   Lack of Transportation (Non-Medical): No  Physical Activity: Insufficiently Active   Days of Exercise per Week: 2 days   Minutes of Exercise per Session: 20 min  Stress: No Stress Concern Present   Feeling of Stress : Not at all  Social Connections: Moderately Isolated   Frequency of Communication with Friends and Family: More than three times a week   Frequency of Social Gatherings with Friends and Family: Once a week   Attends Religious Services: More than 4 times per year   Active Member of [employer] or Organizations: No   Attends Archivist Meetings: Never   Marital Status: Divorced  Human resources officer Violence: Not At Risk   Fear of Current or Ex-Partner: No   Emotionally Abused: No   Physically Abused: No   Sexually Abused: No    Review of Systems: Gen: Denies fever, sweats or chills. No weight loss.  CV: Denies chest pain, palpitations or edema. Resp: Denies cough, shortness of breath of hemoptysis.  GI: See HPI GU : Denies urinary burning, blood in urine, increased urinary frequency or incontinence. MS: Denies joint pain, muscles aches or weakness. Derm: Denies rash, itchiness, skin lesions or unhealing ulcers. Psych: Denies depression, anxiety or  memory loss. Heme: Denies easy bruising, bleeding. Neuro:  Denies headaches, dizziness or paresthesias. Endo:  Denies any problems with DM, thyroid or adrenal function.  Physical Exam: Vital signs in last 24 hours: Temp:  [97.8 F (36.6 C)] 97.8 F (36.6 C) (08/15 2051) Pulse Rate:  [78-95] 78 (08/16 0600) Resp:  [12-20] 18 (08/16 0600) BP: (106-171)/(52-91) 106/56 (08/16 0600) SpO2:  [91 %-100 %] 91 % (08/16 0600) Weight:  [59 kg] 59 kg (08/15 2111)   General:  Alert 86 year old female in no acute distress. Head:  Normocephalic and atraumatic. Eyes:  No scleral icterus. Conjunctiva pink. Ears:  Normal auditory acuity. Nose:  No deformity, discharge or lesions. Mouth: Upper bridge no ulcers or lesions.  Neck:  Supple. No lymphadenopathy or thyromegaly.  Lungs: Breath sounds clear throughout. Heart: Regular rate and rhythm.  Soft systolic murmur. Abdomen:   Rectal: Deferred. Musculoskeletal:  Symmetrical without gross deformities.  Pulses:  Normal pulses noted. Extremities:  Without clubbing or edema. Neurologic:  Alert and  oriented x4. No focal deficits.  Skin:  Intact.  Erythematous patches to the back. Psych:  Alert and cooperative. Normal mood and affect.  Intake/Output from previous day: 08/15 0701 - 08/16 0700 In: 2100 [IV Piggyback:2100] Out: -  Intake/Output this shift: No intake/output data recorded.  Lab Results: Recent Labs    04/19/21 2120 04/20/21 0412  WBC 15.3* 20.8*  HGB 14.2 11.9*  HCT 41.1 33.7*  PLT 272 204   BMET Recent Labs    04/19/21 2120 04/20/21 0412  NA 130* 131*  K 3.4* 3.8  CL 92* 95*  CO2 26 26  GLUCOSE 148* 149*  BUN 10 9  CREATININE 0.55 0.59  CALCIUM 9.4 8.7*   LFT Recent Labs    04/20/21 0412  PROT 5.9*  ALBUMIN 3.4*  AST 606*  ALT 372*  ALKPHOS 104  BILITOT 2.6*  BILIDIR 1.5*  IBILI 1.1*   PT/INR No results for input(s): LABPROT, INR in the last 72 hours. Hepatitis Panel No results for input(s):  HEPBSAG, HCVAB, [place], HEPBIGM in the last  72 hours.    Studies/Results: CT ABDOMEN PELVIS W CONTRAST  Result Date: 04/19/2021 CLINICAL DATA:  Abdominal distension.  Upper abdominal pain. EXAM: CT ABDOMEN AND PELVIS WITH CONTRAST TECHNIQUE: Multidetector CT imaging of the abdomen and pelvis was performed using the standard protocol following bolus administration of intravenous contrast. CONTRAST:  69mL OMNIPAQUE IOHEXOL 350 MG/ML SOLN COMPARISON:  None. FINDINGS: Lower chest: A 5 mm right lower lobe pulmonary nodule is only partially included in the field of view, image 1 series 6. There is a 4 mm subpleural nodule in the right lower lobe, series 6, image 5. No pleural effusion. There is fluid within the distal esophagus with small hiatal hernia. Hepatobiliary: There is intra and extrahepatic biliary ductal dilatation with multiple distal common bile duct stones. At least 6 stones are seen within the mid and distal common bile duct, measuring approximately 5 mm. Common bile duct measures 14 mm. Gallbladder is distended and contains innumerable intraluminal gallstones. There is mild gallbladder wall thickening. Prominent intrahepatic biliary ductal dilatation. Occasional small hepatic cysts. No suspicious liver lesion. Pancreas: Mild peripancreatic fat stranding about the distal body and tail. There is no pancreatic ductal dilatation. No evidence of pancreatic mass. Spleen: Normal in size without focal abnormality. Adrenals/Urinary Tract: There is a 13 mm left adrenal nodule, nonspecific. Normal right adrenal gland. No hydronephrosis or perinephric edema. Homogeneous renal enhancement with symmetric excretion on delayed phase imaging. Urinary bladder is physiologically distended without wall thickening. Stomach/Bowel: Small hiatal hernia distended with intraluminal fluid. Fluid-filled stomach. No gastric wall thickening. No duodenal wall thickening. There is a small periampullary duodenal diverticulum.  Decompressed small bowel. Appendix is not confidently visualized, no appendicitis. Moderate volume of colonic stool. The sigmoid colon is redundant. There is prominent diverticulosis involving the distal descending and sigmoid colon. No diverticulitis. Cystic structure posterior to the rectum measures 7.0 x 3.9 cm, this was described on remote 2001 pelvic MRI and is considered benign. Vascular/Lymphatic: Moderate aortic atherosclerosis. No aortic aneurysm. Patent portal vein. No acute vascular findings. No abdominopelvic adenopathy. Reproductive: Atrophic uterus. There is a 12 mm cyst in the right ovary. No follow-up imaging recommended. Note: This recommendation does not apply to premenarchal patients and to those with increased risk (genetic, family history, elevated tumor markers or other high-risk factors) of ovarian cancer. Reference: JACR 2020 Feb; 17(2):248-254 Other: Cyst posterior to the rectum as described above. No free air or ascites. No abdominal wall hernia. Musculoskeletal: There are no acute or suspicious osseous abnormalities. Age related degenerative change in the spine. IMPRESSION: 1. Choledocholithiasis with multiple distal common bile duct stones, intra and extrahepatic biliary ductal dilatation, and gallbladder distention. Mild gallbladder wall thickening. Multiple intraluminal gallstones. While MRCP is often recommended to assess the biliary tree, stones are readily apparent on CT, and MRCP is not recommended in this elderly patient, as choledocholithiasis is well visualized by CT. 2. Mild peripancreatic fat stranding about the distal body and tail, suspicious for gallstone pancreatitis. There is no pancreatic ductal dilatation or evidence of pancreatic mass. 3. Colonic diverticulosis without diverticulitis. 4. Small hiatal hernia with fluid within the distal esophagus, suggesting reflux. 5. Right lower lobe pulmonary nodules, largest measuring 5 mm. No follow-up needed if patient is low-risk  (and has no known or suspected primary neoplasm). Non-contrast chest CT can be considered in 12 months if patient is high-risk. This recommendation follows the consensus statement: Guidelines for Management of Incidental Pulmonary Nodules Detected on CT Images: From the Fleischner Society 2017; Radiology 2017; 284:228-243. 6.  A 13 mm left adrenal nodule is nonspecific. 7. Benign cyst posterior to the rectum, described on remote 2001 pelvic MRI and considered benign. Aortic Atherosclerosis (ICD10-I70.0). Electronically Signed   By: Keith Rake M.D.   On: 04/19/2021 23:42    IMPRESSION/PLAN:  50) 86 year old female with RUQ pain, elevated LFTs and lipase level.  CTAP confirmed gallstones, choledocholithiasis with intra and extrahepatic biliary ductal dilatation. Alk phos 231 -> 372. AST 592 -> 606. ALT 231 -> 372. T. Bili 1.6 -> 2.6. Direct bili 1.5. Indirect bili 1.1. Lipase 4,307 -Continue LR IV at  200cc/hr  -NPO -Agree with Zosyn 3.375 mg IV every 8 hours -Eventual ERCP when pancreatitis improves -She will require a cholecystectomy after ERCP completed, general surgery following -Pain management per the hospitalist -Zofran 4 mg p.o. or IV every 6 hours as needed -Repeat CBC, hepatic panel and lipase level in a.m. -Await further recommendations per Dr. Lyndel Safe  2) Leukocytosis secondary to # 1. WBC 15.3 -> 20.8.   3)  History of GERD, esophageal stricture -Pantoprazole 40 mg IV every 24 hours  4) CTAP identified a 5 mm right lower lobe nodule - Follow up with PCP as outpatient   5) Hyponatremia. Na +131. -Repeat BMP in a.m.  6) History of polymyalgia rheumatica   Noralyn Pick  04/20/2021, 0:941 AM    Attending physician's note   I have taken an interval history, reviewed the chart and examined the patient. I agree with the Advanced Practitioner's note, impression and recommendations.   Gallstone pancreatitis.  Much improved since adm. lipase trending down.  Although  white cell count is elevated, there is no definite ascending cholangitis. Obvious choledocholithiasis and a dilated CBD on CT. GERD with H/O esophageal stricture  Plan: -Supportive treatment for now -IV Zosyn -Trend CBC, lipase, LFTs -ERCP once more stable. Likely 8/18. Note-May require eso dil prior to passage of ERCP scope. -Appreciate surgical consultation.   Carmell Austria, MD [facility] [phone]

## 2021-04-21 DIAGNOSIS — I10 Essential (primary) hypertension: Secondary | ICD-10-CM

## 2021-04-21 DIAGNOSIS — K851 Biliary acute pancreatitis without necrosis or infection: Secondary | ICD-10-CM | POA: Diagnosis not present

## 2021-04-21 LAB — CBC
HCT: 29.8 % — ABNORMAL LOW (ref 36.0–46.0)
Hemoglobin: 10.2 g/dL — ABNORMAL LOW (ref 12.0–15.0)
MCH: 32.3 pg (ref 26.0–34.0)
MCHC: 34.2 g/dL (ref 30.0–36.0)
MCV: 94.3 fL (ref 80.0–100.0)
Platelets: 168 10*3/uL (ref 150–400)
RBC: 3.16 MIL/uL — ABNORMAL LOW (ref 3.87–5.11)
RDW: 12.1 % (ref 11.5–15.5)
WBC: 11.9 10*3/uL — ABNORMAL HIGH (ref 4.0–10.5)
nRBC: 0 % (ref 0.0–0.2)

## 2021-04-21 LAB — COMPREHENSIVE METABOLIC PANEL
ALT: 183 U/L — ABNORMAL HIGH (ref 0–44)
AST: 178 U/L — ABNORMAL HIGH (ref 15–41)
Albumin: 2.8 g/dL — ABNORMAL LOW (ref 3.5–5.0)
Alkaline Phosphatase: 85 U/L (ref 38–126)
Anion gap: 7 (ref 5–15)
BUN: 11 mg/dL (ref 8–23)
CO2: 25 mmol/L (ref 22–32)
Calcium: 8 mg/dL — ABNORMAL LOW (ref 8.9–10.3)
Chloride: 98 mmol/L (ref 98–111)
Creatinine, Ser: 0.49 mg/dL (ref 0.44–1.00)
GFR, Estimated: >60 mL/min (ref >60)
Glucose, Bld: 93 mg/dL (ref 70–99)
Potassium: 3.2 mmol/L — ABNORMAL LOW (ref 3.5–5.1)
Sodium: 130 mmol/L — ABNORMAL LOW (ref 135–145)
Total Bilirubin: 3 mg/dL — ABNORMAL HIGH (ref 0.3–1.2)
Total Protein: 4.9 g/dL — ABNORMAL LOW (ref 6.5–8.1)

## 2021-04-21 LAB — LIPASE, BLOOD: Lipase: 268 U/L — ABNORMAL HIGH (ref 11–51)

## 2021-04-21 LAB — GLUCOSE, CAPILLARY
Glucose-Capillary: 99 mg/dL (ref 70–99)
Glucose-Capillary: 110 mg/dL — ABNORMAL HIGH (ref 70–99)

## 2021-04-21 LAB — MAGNESIUM: Magnesium: 1.7 mg/dL (ref 1.7–2.4)

## 2021-04-21 MED ORDER — MAGNESIUM SULFATE IN D5W 1-5 GM/100ML-% IV SOLN
1.0000 g | Freq: Once | INTRAVENOUS | Status: AC
  Administered 2021-04-21: 1 g via INTRAVENOUS
  Filled 2021-04-21: qty 100

## 2021-04-21 MED ORDER — POTASSIUM CHLORIDE IN NACL 40-0.9 MEQ/L-% IV SOLN
INTRAVENOUS | Status: DC
  Filled 2021-04-21 (×9): qty 1000

## 2021-04-21 MED ORDER — POTASSIUM CHLORIDE 10 MEQ/100ML IV SOLN
10.0000 meq | Freq: Once | INTRAVENOUS | Status: AC
  Administered 2021-04-21: 10 meq via INTRAVENOUS
  Filled 2021-04-21: qty 100

## 2021-04-21 NOTE — Progress Notes (Signed)
PHARMACY NOTE -  zosyn  Pharmacy has been assisting with dosing of zosyn for intra-abd infection. Dosage remains stable at 3.375 gm IV q8h (infuse over 4 hrs)and need for further dosage adjustment appears unlikely at present.    Will sign off at this time.  Please reconsult if a change in clinical status warrants re-evaluation of dosage.  Dia Sitter, PharmD, BCPS 04/21/2021 10:10 AM

## 2021-04-21 NOTE — Progress Notes (Signed)
[facility]  PROGRESS NOTE  [name] [name] [id number] DOB: 1935/03/05 DOA: 04/19/2021 PCP: [name], MD   Brief HPI:   86 year old female with history of hypertension, postsurgical hypothyroidism presented to ED after starting having abdominal pain at home.  In the ED LFTs were elevated, lipase 4300.  CT abdomen pelvis showed features concerning for gallstone pancreatitis with choledocholithiasis and multiple gallstones in the distal common bile duct.  IV hydration and IV antibiotic started.  Gastroenterology was consulted    Subjective   Patient seen and examined, denies abdominal pain.  ERCP in a.m.   Assessment/Plan:     Gallstone pancreatitis -CT scan shows choledocholithiasis multiple distal CBD stones with possible acute cholecystitis -General surgery and GI following -Plan for ERCP in a.m. -Plan for cholecystectomy after ERCP  Hypertension -Patient is n.p.o. -Continue IV as needed labetalol  Hypokalemia -Potassium was 3.2 -We will replace potassium  Hypomagnesemia -Magnesium is 1.7 -We will replace magnesium  History of postoperative hypothyroidism -Continue IV Synthroid  CT scan also showed cyst in the distal lateral area -Benign cyst, described on remote 2001 pelvic MRI -Follow-up monitoring as outpatient    Scheduled medications:    levothyroxine  44 mcg Intravenous Daily   pantoprazole (PROTONIX) IV  40 mg Intravenous QHS         Data Reviewed:   CBG:  Recent Labs  Lab 04/20/21 0802 04/20/21 1604 04/21/21 0810 04/21/21 1614  GLUCAP 170* 100* 99 110*    SpO2: 100 %    Vitals:   04/20/21 1954 04/21/21 0043 04/21/21 0410 04/21/21 1312  BP: (!) 119/49 (!) 133/58 (!) 129/56 (!) 132/108  Pulse: 72 86 73 74  Resp: '18 15 15 20  '$ Temp: 99.2 F (37.3 C) 98.9 F (37.2 C) 98.3 F (36.8 C) 98.4 F (36.9 C)  TempSrc: Oral Oral Oral Oral  SpO2: 93% 94% 95% 100%  Weight:      Height:         Intake/Output Summary  (Last 24 hours) at 04/21/2021 1847 Last data filed at 04/21/2021 1714 Gross per 24 hour  Intake 893.02 ml  Output 900 ml  Net -6.98 ml    08/15 1901 - 08/17 0700 In: 2949.5 [I.V.:808.3] Out: -   Filed Weights   04/19/21 2111  Weight: 59 kg    CBC:  Recent Labs  Lab 04/19/21 2120 04/20/21 0412 04/21/21 0525  WBC 15.3* 20.8* 11.9*  HGB 14.2 11.9* 10.2*  HCT 41.1 33.7* 29.8*  PLT 272 204 168  MCV 91.7 91.6 94.3  MCH 31.7 32.3 32.3  MCHC 34.5 35.3 34.2  RDW 11.6 11.7 12.1  LYMPHSABS  --  0.7  --   MONOABS  --  1.0  --   EOSABS  --  0.0  --   BASOSABS  --  0.0  --     Complete metabolic panel:  Recent Labs  Lab 04/19/21 2120 04/20/21 0004 04/20/21 0201 04/20/21 0412 04/20/21 0600 04/20/21 0800 04/20/21 1157 04/21/21 0525  NA 130*  --   --  131*  --   --   --  130*  K 3.4*  --   --  3.8  --   --   --  3.2*  CL 92*  --   --  95*  --   --   --  98  CO2 26  --   --  26  --   --   --  25  GLUCOSE 148*  --   --  149*  --   --   --  93  BUN 10  --   --  9  --   --   --  11  CREATININE 0.55  --   --  0.59  --   --   --  0.49  CALCIUM 9.4  --   --  8.7*  --   --   --  8.0*  AST 592*  --   --  606*  --   --   --  178*  ALT 231*  --   --  372*  --   --   --  183*  ALKPHOS 113  --   --  104  --   --   --  85  BILITOT 1.6*  --   --  2.6*  --   --   --  3.0*  ALBUMIN 4.6  --   --  3.4*  --   --   --  2.8*  MG  --   --   --   --   --   --   --  1.7  LATICACIDVEN  --  4.3* 4.0*  --  3.9* 4.0* 1.5  --     Recent Labs  Lab 04/19/21 2120 04/20/21 0412 04/21/21 0525  LIPASE 4,307* 1,041* 268*    Recent Labs  Lab 04/20/21 0004  SARSCOV2NAA NEGATIVE    ------------------------------------------------------------------------------------------------------------------ No results for input(s): CHOL, HDL, LDLCALC, TRIG, CHOLHDL, LDLDIRECT in the last 72 hours.  Lab Results  Component Value Date   HGBA1C 5.7 10/02/2006    ------------------------------------------------------------------------------------------------------------------ No results for input(s): TSH, T4TOTAL, T3FREE, THYROIDAB in the last 72 hours.  Invalid input(s): FREET3 ------------------------------------------------------------------------------------------------------------------ No results for input(s): VITAMINB12, FOLATE, FERRITIN, TIBC, IRON, RETICCTPCT in the last 72 hours.  Coagulation profile No results for input(s): INR, PROTIME in the last 168 hours. No results for input(s): DDIMER in the last 72 hours.  Cardiac Enzymes No results for input(s): CKTOTAL, CKMB, CKMBINDEX, TROPONINI in the last 168 hours.  ------------------------------------------------------------------------------------------------------------------ No results found for: BNP   Antibiotics: Anti-infectives (From admission, onward)    Start     Dose/Rate Route Frequency Ordered Stop   04/20/21 0800  piperacillin-tazobactam (ZOSYN) IVPB 3.375 g        3.375 g 12.5 mL/hr over 240 Minutes Intravenous Every 8 hours 04/20/21 0235     04/20/21 0015  piperacillin-tazobactam (ZOSYN) IVPB 3.375 g        3.375 g 100 mL/hr over 30 Minutes Intravenous  Once 04/20/21 0012 04/20/21 0054        Radiology Reports  CT ABDOMEN PELVIS W CONTRAST  Result Date: 04/19/2021 CLINICAL DATA:  Abdominal distension.  Upper abdominal pain. EXAM: CT ABDOMEN AND PELVIS WITH CONTRAST TECHNIQUE: Multidetector CT imaging of the abdomen and pelvis was performed using the standard protocol following bolus administration of intravenous contrast. CONTRAST:  65m OMNIPAQUE IOHEXOL 350 MG/ML SOLN COMPARISON:  None. FINDINGS: Lower chest: A 5 mm right lower lobe pulmonary nodule is only partially included in the field of view, image 1 series 6. There is a 4 mm subpleural nodule in the right lower lobe, series 6, image 5. No pleural effusion. There is fluid within the distal esophagus with  small hiatal hernia. Hepatobiliary: There is intra and extrahepatic biliary ductal dilatation with multiple distal common bile duct stones. At least 6 stones are seen within the mid and distal common bile duct, measuring approximately 5 mm. Common bile duct measures 14  mm. Gallbladder is distended and contains innumerable intraluminal gallstones. There is mild gallbladder wall thickening. Prominent intrahepatic biliary ductal dilatation. Occasional small hepatic cysts. No suspicious liver lesion. Pancreas: Mild peripancreatic fat stranding about the distal body and tail. There is no pancreatic ductal dilatation. No evidence of pancreatic mass. Spleen: Normal in size without focal abnormality. Adrenals/Urinary Tract: There is a 13 mm left adrenal nodule, nonspecific. Normal right adrenal gland. No hydronephrosis or perinephric edema. Homogeneous renal enhancement with symmetric excretion on delayed phase imaging. Urinary bladder is physiologically distended without wall thickening. Stomach/Bowel: Small hiatal hernia distended with intraluminal fluid. Fluid-filled stomach. No gastric wall thickening. No duodenal wall thickening. There is a small periampullary duodenal diverticulum. Decompressed small bowel. Appendix is not confidently visualized, no appendicitis. Moderate volume of colonic stool. The sigmoid colon is redundant. There is prominent diverticulosis involving the distal descending and sigmoid colon. No diverticulitis. Cystic structure posterior to the rectum measures 7.0 x 3.9 cm, this was described on remote 2001 pelvic MRI and is considered benign. Vascular/Lymphatic: Moderate aortic atherosclerosis. No aortic aneurysm. Patent portal vein. No acute vascular findings. No abdominopelvic adenopathy. Reproductive: Atrophic uterus. There is a 12 mm cyst in the right ovary. No follow-up imaging recommended. Note: This recommendation does not apply to premenarchal patients and to those with increased risk  (genetic, family history, elevated tumor markers or other high-risk factors) of ovarian cancer. Reference: JACR 2020 Feb; 17(2):248-254 Other: Cyst posterior to the rectum as described above. No free air or ascites. No abdominal wall hernia. Musculoskeletal: There are no acute or suspicious osseous abnormalities. Age related degenerative change in the spine. IMPRESSION: 1. Choledocholithiasis with multiple distal common bile duct stones, intra and extrahepatic biliary ductal dilatation, and gallbladder distention. Mild gallbladder wall thickening. Multiple intraluminal gallstones. While MRCP is often recommended to assess the biliary tree, stones are readily apparent on CT, and MRCP is not recommended in this elderly patient, as choledocholithiasis is well visualized by CT. 2. Mild peripancreatic fat stranding about the distal body and tail, suspicious for gallstone pancreatitis. There is no pancreatic ductal dilatation or evidence of pancreatic mass. 3. Colonic diverticulosis without diverticulitis. 4. Small hiatal hernia with fluid within the distal esophagus, suggesting reflux. 5. Right lower lobe pulmonary nodules, largest measuring 5 mm. No follow-up needed if patient is low-risk (and has no known or suspected primary neoplasm). Non-contrast chest CT can be considered in 12 months if patient is high-risk. This recommendation follows the consensus statement: Guidelines for Management of Incidental Pulmonary Nodules Detected on CT Images: From the Fleischner Society 2017; Radiology 2017; 284:228-243. 6. A 13 mm left adrenal nodule is nonspecific. 7. Benign cyst posterior to the rectum, described on remote 2001 pelvic MRI and considered benign. Aortic Atherosclerosis (ICD10-I70.0). Electronically Signed   By: Keith Rake M.D.   On: 04/19/2021 23:42      DVT prophylaxis: SCDs  Code Status: Full code  Family Communication: No family at bedside   Consultants:   Procedures:     Objective     Physical Examination:  General-appears in no acute distress Heart-S1-S2, regular, no murmur auscultated Lungs-clear to auscultation bilaterally, no wheezing or crackles auscultated Abdomen-soft, nontender, no organomegaly Extremities-no edema in the lower extremities Neuro-alert, oriented x3, no focal deficit noted   Status is: Inpatient  Dispo: The patient is from: Home              Anticipated d/c is to: Home  Anticipated d/c date is: 04/24/2021              Patient currently not stable for discharge  Barrier to discharge-gallstone pancreatitis  COVID-19 Labs  No results for input(s): DDIMER, FERRITIN, LDH, CRP in the last 72 hours.  Lab Results  Component Value Date   [place] NEGATIVE 04/20/2021    Microbiology  Recent Results (from the past 240 hour(s))  Resp Panel by RT-PCR (Flu A&B, Covid) Nasopharyngeal Swab     Status: None   Collection Time: 04/20/21 12:04 AM   Specimen: Nasopharyngeal Swab; Nasopharyngeal(NP) swabs in vial transport medium  Result Value Ref Range Status   SARS Coronavirus 2 by RT PCR NEGATIVE NEGATIVE Final    Comment: (NOTE) SARS-CoV-2 target nucleic acids are NOT DETECTED.  The SARS-CoV-2 RNA is generally detectable in upper respiratory specimens during the acute phase of infection. The lowest concentration of SARS-CoV-2 viral copies this assay can detect is 138 copies/mL. A negative result does not preclude SARS-Cov-2 infection and should not be used as the sole basis for treatment or other patient management decisions. A negative result may occur with  improper specimen collection/handling, submission of specimen other than nasopharyngeal swab, presence of viral mutation(s) within the areas targeted by this assay, and inadequate number of viral copies(<138 copies/mL). A negative result must be combined with clinical observations, patient history, and epidemiological information. The expected result is  Negative.  Fact Sheet for Patients:  EntrepreneurPulse.com.au  Fact Sheet for Healthcare Providers:  IncredibleEmployment.be  This test is no t yet approved or cleared by the [place] FDA and  has been authorized for detection and/or diagnosis of SARS-CoV-2 by FDA under an Emergency Use Authorization (EUA). This EUA will remain  in effect (meaning this test can be used) for the duration of the COVID-19 declaration under Section 564(b)(1) of the Act, 21 U.S.C.section 360bbb-3(b)(1), unless the authorization is terminated  or revoked sooner.       Influenza A by PCR NEGATIVE NEGATIVE Final   Influenza B by PCR NEGATIVE NEGATIVE Final    Comment: (NOTE) The Xpert Xpress SARS-CoV-2/FLU/RSV plus assay is intended as an aid in the diagnosis of influenza from Nasopharyngeal swab specimens and should not be used as a sole basis for treatment. Nasal washings and aspirates are unacceptable for Xpert Xpress SARS-CoV-2/FLU/RSV testing.  Fact Sheet for Patients: EntrepreneurPulse.com.au  Fact Sheet for Healthcare Providers: IncredibleEmployment.be  This test is not yet approved or cleared by the [place] FDA and has been authorized for detection and/or diagnosis of SARS-CoV-2 by FDA under an Emergency Use Authorization (EUA). This EUA will remain in effect (meaning this test can be used) for the duration of the COVID-19 declaration under Section 564(b)(1) of the Act, 21 U.S.C. section 360bbb-3(b)(1), unless the authorization is terminated or revoked.  Performed at [facility], [place] [address] [place], [place] [address]              Oswald Hillock   [facility] If 7PM-7AM, please contact night-coverage at [email or web address], Office  [phone]   04/21/2021, 6:47 PM  LOS: 1 day

## 2021-04-21 NOTE — Progress Notes (Addendum)
    CC: Abdominal pain  Subjective: Her biggest complaint this morning is not being able to sleep.  Her abdominal pain has improved markedly she is not showing any tenderness on the right this AM.  Objective: Vital signs in last 24 hours: Temp:  [97.5 F (36.4 C)-99.2 F (37.3 C)] 98.3 F (36.8 C) (08/17 0410) Pulse Rate:  [67-86] 73 (08/17 0410) Resp:  [10-19] 15 (08/17 0410) BP: (101-134)/(48-58) 129/56 (08/17 0410) SpO2:  [92 %-97 %] 95 % (08/17 0410) Last BM Date: 04/20/21 N.p.o. 849 IV recorded No output recorded Afebrile vital signs are stable. NA 130 Potassium 3.2 Lipase 268 AST 178 ALT 183 Total bilirubin 3.0 WBC 11.9  Intake/Output from previous day: 08/16 0701 - 08/17 0700 In: 849.5 [I.V.:808.3; IV Piggyback:41.2] Out: -  Intake/Output this shift: No intake/output data recorded.  General appearance: alert, cooperative, and no distress Resp: clear to auscultation bilaterally GI: Soft, she is not tender on the right side this morning.  She is not distended, no evidence of pancreatitis this AM.  Lab Results:  Recent Labs    04/20/21 0412 04/21/21 0525  WBC 20.8* 11.9*  HGB 11.9* 10.2*  HCT 33.7* 29.8*  PLT 204 168    BMET Recent Labs    04/20/21 0412 04/21/21 0525  NA 131* 130*  K 3.8 3.2*  CL 95* 98  CO2 26 25  GLUCOSE 149* 93  BUN 9 11  CREATININE 0.59 0.49  CALCIUM 8.7* 8.0*   PT/INR No results for input(s): LABPROT, INR in the last 72 hours.  Recent Labs  Lab 04/19/21 2120 04/20/21 0412 04/21/21 0525  AST 592* 606* 178*  ALT 231* 372* 183*  ALKPHOS 113 104 85  BILITOT 1.6* 2.6* 3.0*  PROT 7.7 5.9* 4.9*  ALBUMIN 4.6 3.4* 2.8*     Lipase     Component Value Date/Time   LIPASE 268 (H) 04/21/2021 0525     Medications:  levothyroxine  44 mcg Intravenous Daily   pantoprazole (PROTONIX) IV  40 mg Intravenous QHS    sodium chloride 125 mL/hr at 04/20/21 1611   piperacillin-tazobactam (ZOSYN)  IV 3.375 g (04/21/21 0041)      Assessment/Plan Gallstone pancreatitis/choledocholithiasis   FEN: N.p.o./IV fluids ID: Zosyn DVT: SCDs, she can have DVT prophylaxis from our standpoint   Leukocytosis  -WBC 15.3>> 20.8 >>11.9 Elevated LFTs  - AST 592>> 606>>178  - ALT 231>> 372>>183 Total bilirubin   - 1.6>> 2.6>>3.0 Hyponatremia  -NA 131>>130 Hypokalemia  - K+ 3.4>>3.8>>3.2 Hx thyroid cancer with total thyroidectomy Hypothyroid  - on Synthroid Hx IBS  -Colonoscopy 02/06/2003 Hx esophageal stricture -07/21/2018 Hypertension Hypercholesterolemia  Plan: Checking magnesium, recheck labs tomorrow, adding potassium to the IV fluids and decreasing volume slightly.  Adding incentive spirometry.  Will await ERCP, and then schedule for cholecystectomy.      LOS: 1 day    [name] [name] 04/21/2021 Please see Amion

## 2021-04-21 NOTE — Progress Notes (Addendum)
[facility] Progress Note  CC:  pancreatitis, choledocholithiasis  Subjective:  She is sitting up in the chair this am. No N/V. No upper or lower abdominal pain. She had mild chest tightness yesterday which resolved after she used an incentive spirometer and taking deeper breaths. No current CP or chest tightness. No cough or SOB. She passed a small formed BM this morning. No rectal bleeding or black stools. She is tolerating a clear liquid diet.    Objective:  Vital signs in last 24 hours: Temp:  [97.5 F (36.4 C)-99.2 F (37.3 C)] 98.3 F (36.8 C) (08/17 0410) Pulse Rate:  [67-86] 73 (08/17 0410) Resp:  [10-19] 15 (08/17 0410) BP: (101-134)/(48-58) 129/56 (08/17 0410) SpO2:  [92 %-97 %] 95 % (08/17 0410) Last BM Date: 04/20/21  General:   Alert in NAD.  Heart: RRR, soft systolic murmur.  Pulm:  Breath sounds clear throughout.  Abdomen: Soft, nondistended. Nontender. + BS x 4 quads. Midline scar intact.  Extremities:  Without edema. Neurologic:  Alert and  oriented x4. Grossly normal neurologically. Psych:  Alert and cooperative. Normal mood and affect.  Intake/Output from previous day: 08/16 0701 - 08/17 0700 In: 849.5 [I.V.:808.3; IV Piggyback:41.2] Out: -  Intake/Output this shift: No intake/output data recorded.  Lab Results: Recent Labs    04/19/21 2120 04/20/21 0412 04/21/21 0525  WBC 15.3* 20.8* 11.9*  HGB 14.2 11.9* 10.2*  HCT 41.1 33.7* 29.8*  PLT 272 204 168   BMET Recent Labs    04/19/21 2120 04/20/21 0412 04/21/21 0525  NA 130* 131* 130*  K 3.4* 3.8 3.2*  CL 92* 95* 98  CO2 '26 26 25  ' GLUCOSE 148* 149* 93  BUN '10 9 11  ' CREATININE 0.55 0.59 0.49  CALCIUM 9.4 8.7* 8.0*   LFT Recent Labs    04/20/21 0412 04/21/21 0525  PROT 5.9* 4.9*  ALBUMIN 3.4* 2.8*  AST 606* 178*  ALT 372* 183*  ALKPHOS 104 85  BILITOT 2.6* 3.0*  BILIDIR 1.5*  --   IBILI 1.1*  --    PT/INR No results for input(s): LABPROT, INR in the last 72  hours. Hepatitis Panel No results for input(s): HEPBSAG, HCVAB, HEPAIGM, HEPBIGM in the last 72 hours.  CT ABDOMEN PELVIS W CONTRAST  Result Date: 04/19/2021 CLINICAL DATA:  Abdominal distension.  Upper abdominal pain. EXAM: CT ABDOMEN AND PELVIS WITH CONTRAST TECHNIQUE: Multidetector CT imaging of the abdomen and pelvis was performed using the standard protocol following bolus administration of intravenous contrast. CONTRAST:  42m OMNIPAQUE IOHEXOL 350 MG/ML SOLN COMPARISON:  None. FINDINGS: Lower chest: A 5 mm right lower lobe pulmonary nodule is only partially included in the field of view, image 1 series 6. There is a 4 mm subpleural nodule in the right lower lobe, series 6, image 5. No pleural effusion. There is fluid within the distal esophagus with small hiatal hernia. Hepatobiliary: There is intra and extrahepatic biliary ductal dilatation with multiple distal common bile duct stones. At least 6 stones are seen within the mid and distal common bile duct, measuring approximately 5 mm. Common bile duct measures 14 mm. Gallbladder is distended and contains innumerable intraluminal gallstones. There is mild gallbladder wall thickening. Prominent intrahepatic biliary ductal dilatation. Occasional small hepatic cysts. No suspicious liver lesion. Pancreas: Mild peripancreatic fat stranding about the distal body and tail. There is no pancreatic ductal dilatation. No evidence of pancreatic mass. Spleen: Normal in size without focal abnormality. Adrenals/Urinary Tract: There is a 13  mm left adrenal nodule, nonspecific. Normal right adrenal gland. No hydronephrosis or perinephric edema. Homogeneous renal enhancement with symmetric excretion on delayed phase imaging. Urinary bladder is physiologically distended without wall thickening. Stomach/Bowel: Small hiatal hernia distended with intraluminal fluid. Fluid-filled stomach. No gastric wall thickening. No duodenal wall thickening. There is a small periampullary  duodenal diverticulum. Decompressed small bowel. Appendix is not confidently visualized, no appendicitis. Moderate volume of colonic stool. The sigmoid colon is redundant. There is prominent diverticulosis involving the distal descending and sigmoid colon. No diverticulitis. Cystic structure posterior to the rectum measures 7.0 x 3.9 cm, this was described on remote 2001 pelvic MRI and is considered benign. Vascular/Lymphatic: Moderate aortic atherosclerosis. No aortic aneurysm. Patent portal vein. No acute vascular findings. No abdominopelvic adenopathy. Reproductive: Atrophic uterus. There is a 12 mm cyst in the right ovary. No follow-up imaging recommended. Note: This recommendation does not apply to premenarchal patients and to those with increased risk (genetic, family history, elevated tumor markers or other high-risk factors) of ovarian cancer. Reference: JACR 2020 Feb; 17(2):248-254 Other: Cyst posterior to the rectum as described above. No free air or ascites. No abdominal wall hernia. Musculoskeletal: There are no acute or suspicious osseous abnormalities. Age related degenerative change in the spine. IMPRESSION: 1. Choledocholithiasis with multiple distal common bile duct stones, intra and extrahepatic biliary ductal dilatation, and gallbladder distention. Mild gallbladder wall thickening. Multiple intraluminal gallstones. While MRCP is often recommended to assess the biliary tree, stones are readily apparent on CT, and MRCP is not recommended in this elderly patient, as choledocholithiasis is well visualized by CT. 2. Mild peripancreatic fat stranding about the distal body and tail, suspicious for gallstone pancreatitis. There is no pancreatic ductal dilatation or evidence of pancreatic mass. 3. Colonic diverticulosis without diverticulitis. 4. Small hiatal hernia with fluid within the distal esophagus, suggesting reflux. 5. Right lower lobe pulmonary nodules, largest measuring 5 mm. No follow-up needed  if patient is low-risk (and has no known or suspected primary neoplasm). Non-contrast chest CT can be considered in 12 months if patient is high-risk. This recommendation follows the consensus statement: Guidelines for Management of Incidental Pulmonary Nodules Detected on CT Images: From the Fleischner Society 2017; Radiology 2017; 284:228-243. 6. A 13 mm left adrenal nodule is nonspecific. 7. Benign cyst posterior to the rectum, described on remote 2001 pelvic MRI and considered benign. Aortic Atherosclerosis (ICD10-I70.0). Electronically Signed   By: Keith Rake M.D.   On: 04/19/2021 23:42    Assessment / Plan:  40) 86 year old female with RUQ pain, elevated LFTs and lipase level.  CTAP confirmed gallstones, choledocholithiasis with intra and extrahepatic biliary ductal dilatation. Alk phos 231 -> 372 -> 85. AST 592 -> 606. -> 178. ALT 231 -> 372. -> 183. T. Bili 1.6 -> 2.6 -> 3.0. Direct bili 1.5. Lipase 4,307 -> 268. No N/V. No abdominal pain. Tolerating a clear liquid diet.  -Reduce LR IV to 100cc/hr  -Clear liquid diet -Continue Zosyn 3.375 mg IV every 8 hours -ERCP likely Thursday 04/22/2021, await further recommendations per Dr. Lyndel Safe (she may require esophageal dilatation secondary to past hx of an esophageal stricture prior to passage of ERCP scope. ERCP benefits and risks discussed including risk with sedation, risk of bleeding, perforation, pancreatitis and infection  -She will require a cholecystectomy after ERCP completed, general surgery following -Pain management per the hospitalist -Zofran 4 mg p.o. or IV every 6 hours as needed -Repeat CBC, hepatic panel and lipase level in a.m. -Await further recommendations per Dr.  Lyndel Safe -General surgery following    2) Leukocytosis secondary to # 1. WBC 15.3 -> 20.8 -> 11.9.  She is afebrile.    3)  History of GERD, esophageal stricture -Pantoprazole 40 mg IV every 24 hours   4) CTAP identified a 5 mm right lower lobe nodule - Follow up  with PCP as outpatient    5) Hyponatremia. Na +131 -> 130. -Reduce IV fluids 100cclhr -Repeat BMP in a.m.  6) Hypokalemia. K+ 3.2.  -KCL replacement per the hospitalist   7) Hypocalcemia -Calcium replacement per the hospitalist   8) Normocytic anemia, no overt GI bleeding, likely dilutional component. Hg 10.2. MCV 94.3.  -CBC in am   9) History of polymyalgia rheumatica       Principal Problem:   Gallstone pancreatitis Active Problems:   Hypothyroidism, postsurgical   Essential hypertension   Choledocholithiasis     LOS: 1 day   Noralyn Pick  04/21/2021, 9:21 AM    Attending physician's note   I have taken an interval history, reviewed the chart and examined the patient. I agree with the Advanced Practitioner's note, impression and recommendations.   Doing much better today.  Abdominal pain has resolved. LFTs trending down. TB still elevated. WBC count down to 12K  Gallstone pancreatitis Choledocholithiasis H/O esophageal stricture.  Plan: -EGD with dil followed by ERCP -hopefully 8/18 depending upon anesthesia's availability. -Advance diet to full liquid diet. -Trend CBC, LFTs -Continue Zosyn for now.   Carmell Austria, MD [facility] [phone]

## 2021-04-22 DIAGNOSIS — K851 Biliary acute pancreatitis without necrosis or infection: Secondary | ICD-10-CM | POA: Diagnosis not present

## 2021-04-22 DIAGNOSIS — I10 Essential (primary) hypertension: Secondary | ICD-10-CM | POA: Diagnosis not present

## 2021-04-22 DIAGNOSIS — E890 Postprocedural hypothyroidism: Secondary | ICD-10-CM | POA: Diagnosis not present

## 2021-04-22 DIAGNOSIS — K8050 Calculus of bile duct without cholangitis or cholecystitis without obstruction: Secondary | ICD-10-CM | POA: Diagnosis not present

## 2021-04-22 LAB — COMPREHENSIVE METABOLIC PANEL
ALT: 127 U/L — ABNORMAL HIGH (ref 0–44)
AST: 78 U/L — ABNORMAL HIGH (ref 15–41)
Albumin: 3.4 g/dL — ABNORMAL LOW (ref 3.5–5.0)
Alkaline Phosphatase: 96 U/L (ref 38–126)
Anion gap: 6 (ref 5–15)
BUN: <5 mg/dL — ABNORMAL LOW (ref 8–23)
CO2: 24 mmol/L (ref 22–32)
Calcium: 8.7 mg/dL — ABNORMAL LOW (ref 8.9–10.3)
Chloride: 103 mmol/L (ref 98–111)
Creatinine, Ser: 0.53 mg/dL (ref 0.44–1.00)
GFR, Estimated: >60 mL/min (ref >60)
Glucose, Bld: 113 mg/dL — ABNORMAL HIGH (ref 70–99)
Potassium: 4.1 mmol/L (ref 3.5–5.1)
Sodium: 133 mmol/L — ABNORMAL LOW (ref 135–145)
Total Bilirubin: 1.3 mg/dL — ABNORMAL HIGH (ref 0.3–1.2)
Total Protein: 5.9 g/dL — ABNORMAL LOW (ref 6.5–8.1)

## 2021-04-22 LAB — PROTIME-INR
INR: 1.1 (ref 0.8–1.2)
Prothrombin Time: 14.1 seconds (ref 11.4–15.2)

## 2021-04-22 LAB — GLUCOSE, CAPILLARY
Glucose-Capillary: 103 mg/dL — ABNORMAL HIGH (ref 70–99)
Glucose-Capillary: 114 mg/dL — ABNORMAL HIGH (ref 70–99)
Glucose-Capillary: 91 mg/dL (ref 70–99)
Glucose-Capillary: 95 mg/dL (ref 70–99)

## 2021-04-22 LAB — CBC
HCT: 32.5 % — ABNORMAL LOW (ref 36.0–46.0)
Hemoglobin: 11.3 g/dL — ABNORMAL LOW (ref 12.0–15.0)
MCH: 32.2 pg (ref 26.0–34.0)
MCHC: 34.8 g/dL (ref 30.0–36.0)
MCV: 92.6 fL (ref 80.0–100.0)
Platelets: 197 10*3/uL (ref 150–400)
RBC: 3.51 MIL/uL — ABNORMAL LOW (ref 3.87–5.11)
RDW: 12 % (ref 11.5–15.5)
WBC: 6.4 10*3/uL (ref 4.0–10.5)
nRBC: 0 % (ref 0.0–0.2)

## 2021-04-22 LAB — LIPASE, BLOOD: Lipase: 99 U/L — ABNORMAL HIGH (ref 11–51)

## 2021-04-22 MED ORDER — CIPROFLOXACIN IN D5W 400 MG/200ML IV SOLN: 400.0000 mg | INTRAVENOUS | Status: DC

## 2021-04-22 MED ORDER — CIPROFLOXACIN IN D5W 400 MG/200ML IV SOLN
400.0000 mg | INTRAVENOUS | Status: AC
  Filled 2021-04-22: qty 200

## 2021-04-22 NOTE — Progress Notes (Addendum)
    [id number] pain  Subjective: No pain this AM, awaiting ERCP and possible esophageal dilatation.  Objective: Vital signs in last 24 hours: Temp:  [97.9 F (36.6 C)-98.4 F (36.9 C)] 97.9 F (36.6 C) (08/18 0334) Pulse Rate:  [74-84] 83 (08/18 0334) Resp:  [15-20] 15 (08/18 0334) BP: (132-150)/(64-108) 142/64 (08/18 0334) SpO2:  [96 %-100 %] 96 % (08/18 0334) Weight:  [70.6 kg] 70.6 kg (08/18 0417) Last BM Date: 04/20/21 660 p.o. 2000 IV 900 urine Stool X3 Afebrile, vital signs are stable Labs are pending Afebrile vital signs are stable  Intake/Output from previous day: 08/17 0701 - 08/18 0700 In: 2637.6 [P.O.:660; I.V.:1726.6; IV Piggyback:251] Out: 900 [Urine:900] Intake/Output this shift: No intake/output data recorded.  General appearance: alert, cooperative, and no distress Resp: clear to auscultation bilaterally GI: soft, labs pending,   Lab Results:  Recent Labs    04/20/21 0412 04/21/21 0525  WBC 20.8* 11.9*  HGB 11.9* 10.2*  HCT 33.7* 29.8*  PLT 204 168    BMET Recent Labs    04/20/21 0412 04/21/21 0525  NA 131* 130*  K 3.8 3.2*  CL 95* 98  CO2 26 25  GLUCOSE 149* 93  BUN 9 11  CREATININE 0.59 0.49  CALCIUM 8.7* 8.0*   PT/INR No results for input(s): LABPROT, INR in the last 72 hours.  Recent Labs  Lab 04/19/21 2120 04/20/21 0412 04/21/21 0525  AST 592* 606* 178*  ALT 231* 372* 183*  ALKPHOS 113 104 85  BILITOT 1.6* 2.6* 3.0*  PROT 7.7 5.9* 4.9*  ALBUMIN 4.6 3.4* 2.8*     Lipase     Component Value Date/Time   LIPASE 268 (H) 04/21/2021 0525     Medications:  levothyroxine  44 mcg Intravenous Daily   pantoprazole (PROTONIX) IV  40 mg Intravenous QHS    Assessment/Plan Gallstone pancreatitis/choledocholithiasis  - for ERCP/esophageal dilatation today  - recheck labs in AM  -  Post and preoped for surgery tomorrow   FEN: N.p.o./IV fluids ID: Zosyn DVT: SCDs, she can have DVT prophylaxis from our standpoint    Leukocytosis  -WBC 15.3>> 20.8 >>11.9 Elevated LFTs  - AST 592>> 606>>178  - ALT 231>> 372>>183 Total bilirubin   - 1.6>> 2.6>>3.0 Hyponatremia  -NA 131>>130 Hypokalemia  - K+ 3.4>>3.8>>3.2 Hx thyroid cancer with total thyroidectomy Hypothyroid  - on Synthroid Hx IBS  -Colonoscopy 02/06/2003 Hx esophageal stricture -07/21/2018 Hypertension Hypercholesterolemia       LOS: 2 days    Greer Koeppen 04/22/2021 Please see Amion

## 2021-04-22 NOTE — Progress Notes (Signed)
[facility]  PROGRESS NOTE  [name] [name] [id number] DOB: [birth year] DOA: 04/19/2021 PCP: [name], MD   Brief HPI:   86 year old female with history of hypertension, postsurgical hypothyroidism presented to ED after starting having abdominal pain at home.  In the ED LFTs were elevated, lipase 4300.  CT abdomen pelvis showed features concerning for gallstone pancreatitis with choledocholithiasis and multiple gallstones in the distal common bile duct.  IV hydration and IV antibiotic started.  Gastroenterology was consulted    Subjective   Patient seen and examined, ERCP postponed till tomorrow morning due to lack of anesthesia  in Endo.   Assessment/Plan:     Gallstone pancreatitis -CT scan shows choledocholithiasis multiple distal CBD stones with possible acute cholecystitis -General surgery and GI following -Plan for ERCP in a.m. -Plan for cholecystectomy after ERCP  Hypertension -Patient is n.p.o. -Continue IV as needed labetalol  Hypokalemia -Replete  Hypomagnesemia -Magnesium was replaced  Hyponatremia -Sodium has improved to 133  History of postoperative hypothyroidism -Continue IV Synthroid  CT scan also showed cyst in the distal lateral area -Benign cyst, described on remote 2001 pelvic MRI -Follow-up monitoring as outpatient    Scheduled medications:    levothyroxine  44 mcg Intravenous Daily   pantoprazole (PROTONIX) IV  40 mg Intravenous QHS         Data Reviewed:   CBG:  Recent Labs  Lab 04/20/21 1604 04/21/21 0810 04/21/21 1614 04/22/21 0006 04/22/21 0821  GLUCAP 100* 99 110* 103* 114*    SpO2: 99 %    Vitals:   04/21/21 1952 04/22/21 0334 04/22/21 0417 04/22/21 1418  BP: (!) 150/64 (!) 142/64  (!) 144/69  Pulse: 84 83  80  Resp: '15 15  16  '$ Temp: 98 F (36.7 C) 97.9 F (36.6 C)  97.6 F (36.4 C)  TempSrc: Oral Oral    SpO2: 97% 96%  99%  Weight:   70.6 kg   Height:         Intake/Output Summary  (Last 24 hours) at 04/22/2021 1611 Last data filed at 04/22/2021 1440 Gross per 24 hour  Intake 2224.59 ml  Output 700 ml  Net 1524.59 ml    08/16 1901 - 08/18 0700 In: 2637.6 [P.O.:660; I.V.:1726.6] Out: 900 [Urine:900]  Filed Weights   04/19/21 2111 04/22/21 0417  Weight: 59 kg 70.6 kg    CBC:  Recent Labs  Lab 04/19/21 2120 04/20/21 0412 04/21/21 0525 04/22/21 0851  WBC 15.3* 20.8* 11.9* 6.4  HGB 14.2 11.9* 10.2* 11.3*  HCT 41.1 33.7* 29.8* 32.5*  PLT 272 204 168 197  MCV 91.7 91.6 94.3 92.6  MCH 31.7 32.3 32.3 32.2  MCHC 34.5 35.3 34.2 34.8  RDW 11.6 11.7 12.1 12.0  LYMPHSABS  --  0.7  --   --   MONOABS  --  1.0  --   --   EOSABS  --  0.0  --   --   BASOSABS  --  0.0  --   --     Complete metabolic panel:  Recent Labs  Lab 04/19/21 2120 04/20/21 0004 04/20/21 0201 04/20/21 0412 04/20/21 0600 04/20/21 0800 04/20/21 1157 04/21/21 0525 04/22/21 0851  NA 130*  --   --  131*  --   --   --  130* 133*  K 3.4*  --   --  3.8  --   --   --  3.2* 4.1  CL 92*  --   --  95*  --   --   --  98 103  CO2 26  --   --  26  --   --   --  25 24  GLUCOSE 148*  --   --  149*  --   --   --  93 113*  BUN 10  --   --  9  --   --   --  11 <5*  CREATININE 0.55  --   --  0.59  --   --   --  0.49 0.53  CALCIUM 9.4  --   --  8.7*  --   --   --  8.0* 8.7*  AST 592*  --   --  606*  --   --   --  178* 78*  ALT 231*  --   --  372*  --   --   --  183* 127*  ALKPHOS 113  --   --  104  --   --   --  85 96  BILITOT 1.6*  --   --  2.6*  --   --   --  3.0* 1.3*  ALBUMIN 4.6  --   --  3.4*  --   --   --  2.8* 3.4*  MG  --   --   --   --   --   --   --  1.7  --   LATICACIDVEN  --  4.3* 4.0*  --  3.9* 4.0* 1.5  --   --   INR  --   --   --   --   --   --   --   --  1.1    Recent Labs  Lab 04/19/21 2120 04/20/21 0412 04/21/21 0525 04/22/21 0851  LIPASE 4,307* 1,041* 268* 99*    Recent Labs  Lab 04/20/21 0004  SARSCOV2NAA NEGATIVE     ------------------------------------------------------------------------------------------------------------------ No results for input(s): CHOL, HDL, LDLCALC, TRIG, CHOLHDL, LDLDIRECT in the last 72 hours.  Lab Results  Component Value Date   HGBA1C 5.7 10/02/2006   ------------------------------------------------------------------------------------------------------------------ No results for input(s): TSH, T4TOTAL, T3FREE, THYROIDAB in the last 72 hours.  Invalid input(s): FREET3 ------------------------------------------------------------------------------------------------------------------ No results for input(s): VITAMINB12, FOLATE, FERRITIN, TIBC, IRON, RETICCTPCT in the last 72 hours.  Coagulation profile Recent Labs  Lab 04/22/21 0851  INR 1.1   No results for input(s): DDIMER in the last 72 hours.  Cardiac Enzymes No results for input(s): CKTOTAL, CKMB, CKMBINDEX, TROPONINI in the last 168 hours.  ------------------------------------------------------------------------------------------------------------------ No results found for: BNP   Antibiotics: Anti-infectives (From admission, onward)    Start     Dose/Rate Route Frequency Ordered Stop   04/23/21 0600  ciprofloxacin (CIPRO) IVPB 400 mg  Status:  Discontinued        400 mg 200 mL/hr over 60 Minutes Intravenous On call to O.R. 04/22/21 0914 04/22/21 1516   04/23/21 0600  ciprofloxacin (CIPRO) IVPB 400 mg        400 mg 200 mL/hr over 60 Minutes Intravenous On call to O.R. 04/22/21 1516 04/24/21 0559   04/20/21 0800  piperacillin-tazobactam (ZOSYN) IVPB 3.375 g        3.375 g 12.5 mL/hr over 240 Minutes Intravenous Every 8 hours 04/20/21 0235     04/20/21 0015  piperacillin-tazobactam (ZOSYN) IVPB 3.375 g        3.375 g 100 mL/hr over 30 Minutes Intravenous  Once 04/20/21 0012 04/20/21 0054        Radiology Reports  No results found.  DVT prophylaxis: SCDs  Code Status: Full code  Family  Communication: No family at bedside   Consultants:   Procedures:     Objective    Physical Examination:  General-appears in no acute distress Heart-S1-S2, regular, no murmur auscultated Lungs-clear to auscultation bilaterally, no wheezing or crackles auscultated Abdomen-soft, nontender, no organomegaly Extremities-no edema in the lower extremities Neuro-alert, oriented x3, no focal deficit noted   Status is: Inpatient  Dispo: The patient is from: Home              Anticipated d/c is to: Home              Anticipated d/c date is: 04/24/2021              Patient currently not stable for discharge  Barrier to discharge-gallstone pancreatitis  COVID-19 Labs  No results for input(s): DDIMER, FERRITIN, LDH, CRP in the last 72 hours.  Lab Results  Component Value Date   [place] NEGATIVE 04/20/2021    Microbiology  Recent Results (from the past 240 hour(s))  Resp Panel by RT-PCR (Flu A&B, Covid) Nasopharyngeal Swab     Status: None   Collection Time: 04/20/21 12:04 AM   Specimen: Nasopharyngeal Swab; Nasopharyngeal(NP) swabs in vial transport medium  Result Value Ref Range Status   SARS Coronavirus 2 by RT PCR NEGATIVE NEGATIVE Final    Comment: (NOTE) SARS-CoV-2 target nucleic acids are NOT DETECTED.  The SARS-CoV-2 RNA is generally detectable in upper respiratory specimens during the acute phase of infection. The lowest concentration of SARS-CoV-2 viral copies this assay can detect is 138 copies/mL. A negative result does not preclude SARS-Cov-2 infection and should not be used as the sole basis for treatment or other patient management decisions. A negative result may occur with  improper specimen collection/handling, submission of specimen other than nasopharyngeal swab, presence of viral mutation(s) within the areas targeted by this assay, and inadequate number of viral copies(<138 copies/mL). A negative result must be combined with clinical  observations, patient history, and epidemiological information. The expected result is Negative.  Fact Sheet for Patients:  EntrepreneurPulse.com.au  Fact Sheet for Healthcare Providers:  IncredibleEmployment.be  This test is no t yet approved or cleared by the [place] FDA and  has been authorized for detection and/or diagnosis of SARS-CoV-2 by FDA under an Emergency Use Authorization (EUA). This EUA will remain  in effect (meaning this test can be used) for the duration of the COVID-19 declaration under Section 564(b)(1) of the Act, 21 U.S.C.section 360bbb-3(b)(1), unless the authorization is terminated  or revoked sooner.       Influenza A by PCR NEGATIVE NEGATIVE Final   Influenza B by PCR NEGATIVE NEGATIVE Final    Comment: (NOTE) The Xpert Xpress SARS-CoV-2/FLU/RSV plus assay is intended as an aid in the diagnosis of influenza from Nasopharyngeal swab specimens and should not be used as a sole basis for treatment. Nasal washings and aspirates are unacceptable for Xpert Xpress SARS-CoV-2/FLU/RSV testing.  Fact Sheet for Patients: EntrepreneurPulse.com.au  Fact Sheet for Healthcare Providers: IncredibleEmployment.be  This test is not yet approved or cleared by the [place] FDA and has been authorized for detection and/or diagnosis of SARS-CoV-2 by FDA under an Emergency Use Authorization (EUA). This EUA will remain in effect (meaning this test can be used) for the duration of the COVID-19 declaration under Section 564(b)(1) of the Act, 21 U.S.C. section 360bbb-3(b)(1), unless the authorization is terminated or revoked.  Performed at [facility], [address] [place], [place] [address]              Oswald Hillock   [facility] If 7PM-7AM, please contact night-coverage at [email or web address], Office  [phone]   04/22/2021, 4:11 PM  LOS: 2 days

## 2021-04-22 NOTE — Progress Notes (Addendum)
[facility] Progress Note  CC:  pancreatitis, choledocholithiasis  Subjective:  She is disappointed ERCP postponed until tomorrow. She is tolerating a full liquid diet. She passed 2 small formed bowel movements yesterday. No N/V or abdominal pain.   Objective:  Vital signs in last 24 hours: Temp:  [97.9 F (36.6 C)-98.4 F (36.9 C)] 97.9 F (36.6 C) (08/18 0334) Pulse Rate:  [74-84] 83 (08/18 0334) Resp:  [15-20] 15 (08/18 0334) BP: (132-150)/(64-108) 142/64 (08/18 0334) SpO2:  [96 %-100 %] 96 % (08/18 0334) Weight:  [70.6 kg] 70.6 kg (08/18 0417) Last BM Date: 04/20/21 General:   Alert in NAD.  Heart: RRR, no murmur.  Pulm:  Breath sounds clear throughout.  Abdomen: Soft, nontender. Nondistended. + BS x 4 quads.  Extremities:  Without edema. Neurologic:  Alert and  oriented x4. Grossly normal neurologically. Psych:  Alert and cooperative. Normal mood and affect.  Intake/Output from previous day: 08/17 0701 - 08/18 0700 In: 2637.6 [P.O.:660; I.V.:1726.6; IV Piggyback:251] Out: 900 [Urine:900] Intake/Output this shift: No intake/output data recorded.  Lab Results: Recent Labs    04/20/21 0412 04/21/21 0525 04/22/21 0851  WBC 20.8* 11.9* 6.4  HGB 11.9* 10.2* 11.3*  HCT 33.7* 29.8* 32.5*  PLT 204 168 197   BMET Recent Labs    04/20/21 0412 04/21/21 0525 04/22/21 0851  NA 131* 130* 133*  K 3.8 3.2* 4.1  CL 95* 98 103  CO2 '26 25 24  '$ GLUCOSE 149* 93 113*  BUN 9 11 <5*  CREATININE 0.59 0.49 0.53  CALCIUM 8.7* 8.0* 8.7*   LFT Recent Labs    04/20/21 0412 04/21/21 0525 04/22/21 0851  PROT 5.9*   < > 5.9*  ALBUMIN 3.4*   < > 3.4*  AST 606*   < > 78*  ALT 372*   < > 127*  ALKPHOS 104   < > 96  BILITOT 2.6*   < > 1.3*  BILIDIR 1.5*  --   --   IBILI 1.1*  --   --    < > = values in this interval not displayed.   PT/INR Recent Labs    04/22/21 0851  LABPROT 14.1  INR 1.1   Hepatitis Panel No results for input(s): HEPBSAG,  HCVAB, HEPAIGM, HEPBIGM in the last 72 hours.  No results found.  Assessment / Plan:  103) 86 year old female with RUQ pain, elevated LFTs and lipase level.  CTAP confirmed gallstones, choledocholithiasis with intra and extrahepatic biliary ductal dilatation. LFTs drifting downward.  Lipase 4,307 -> 268 -> 99. No N/V. No abdominal pain. Tolerating a full liquid diet.  -Possible esophageal dilatation with ERCP postponed until tomorrow due to lack of anesthesia availability in endo today  -Full liquid diet today then NPO after midnight  -Eventual laparoscopic cholecystectomy, general surgery following   2) Leukocytosis secondary to # 1, resolved. Remains on Zosyn IV.     3)  History of GERD, esophageal stricture -Pantoprazole 40 mg IV every 24 hours   4) CTAP identified a 5 mm right lower lobe nodule - Follow up with PCP as outpatient    5) Hyponatremia. Na +131 -> 130 -> 133. -IV fluids reduced to 100cclhr  on 8/17 -Repeat BMP in a.m.  7) Normocytic anemia, no overt GI bleeding, likely dilutional component. Hg 10.2 -> 11.3. -CBC in am   8) History of polymyalgia rheumatica    Principal Problem:   Gallstone pancreatitis Active Problems:   Hypothyroidism, postsurgical   Essential hypertension  Choledocholithiasis     LOS: 2 days   Noralyn Pick  04/22/2021, 10:18 AM   Attending physician's note   I have taken an interval history, reviewed the chart and examined the patient. I agree with the Advanced Practitioner's note, impression and recommendations.   Patient doing well.  No nausea/vomiting or abdominal pain.  ERCP could not be performed today d/t scheduling constraints.  Has been scheduled for tomorrow.  Would need EGD with balloon dilatation prior.  Carmell Austria, MD [facility] [phone]

## 2021-04-22 NOTE — Care Management Important Message (Signed)
Important Message  Patient Details IM Letter given to the Patient. Name: [name] [name] MRN: [id number] Date of Birth: 11/23/1934   Medicare Important Message Given:  Yes     Kerin Salen 04/22/2021, 9:33 AM

## 2021-04-22 NOTE — H&P (View-Only) (Signed)
[facility] Progress Note  CC:  pancreatitis, choledocholithiasis  Subjective:  She is disappointed ERCP postponed until tomorrow. She is tolerating a full liquid diet. She passed 2 small formed bowel movements yesterday. No N/V or abdominal pain.   Objective:  Vital signs in last 24 hours: Temp:  [97.9 F (36.6 C)-98.4 F (36.9 C)] 97.9 F (36.6 C) (08/18 0334) Pulse Rate:  [74-84] 83 (08/18 0334) Resp:  [15-20] 15 (08/18 0334) BP: (132-150)/(64-108) 142/64 (08/18 0334) SpO2:  [96 %-100 %] 96 % (08/18 0334) Weight:  [70.6 kg] 70.6 kg (08/18 0417) Last BM Date: 04/20/21 General:   Alert in NAD.  Heart: RRR, no murmur.  Pulm:  Breath sounds clear throughout.  Abdomen: Soft, nontender. Nondistended. + BS x 4 quads.  Extremities:  Without edema. Neurologic:  Alert and  oriented x4. Grossly normal neurologically. Psych:  Alert and cooperative. Normal mood and affect.  Intake/Output from previous day: 08/17 0701 - 08/18 0700 In: 2637.6 [P.O.:660; I.V.:1726.6; IV Piggyback:251] Out: 900 [Urine:900] Intake/Output this shift: No intake/output data recorded.  Lab Results: Recent Labs    04/20/21 0412 04/21/21 0525 04/22/21 0851  WBC 20.8* 11.9* 6.4  HGB 11.9* 10.2* 11.3*  HCT 33.7* 29.8* 32.5*  PLT 204 168 197   BMET Recent Labs    04/20/21 0412 04/21/21 0525 04/22/21 0851  NA 131* 130* 133*  K 3.8 3.2* 4.1  CL 95* 98 103  CO2 '26 25 24  '$ GLUCOSE 149* 93 113*  BUN 9 11 <5*  CREATININE 0.59 0.49 0.53  CALCIUM 8.7* 8.0* 8.7*   LFT Recent Labs    04/20/21 0412 04/21/21 0525 04/22/21 0851  PROT 5.9*   < > 5.9*  ALBUMIN 3.4*   < > 3.4*  AST 606*   < > 78*  ALT 372*   < > 127*  ALKPHOS 104   < > 96  BILITOT 2.6*   < > 1.3*  BILIDIR 1.5*  --   --   IBILI 1.1*  --   --    < > = values in this interval not displayed.   PT/INR Recent Labs    04/22/21 0851  LABPROT 14.1  INR 1.1   Hepatitis Panel No results for input(s): HEPBSAG,  HCVAB, HEPAIGM, HEPBIGM in the last 72 hours.  No results found.  Assessment / Plan:  43) 86 year old female with RUQ pain, elevated LFTs and lipase level.  CTAP confirmed gallstones, choledocholithiasis with intra and extrahepatic biliary ductal dilatation. LFTs drifting downward.  Lipase 4,307 -> 268 -> 99. No N/V. No abdominal pain. Tolerating a full liquid diet.  -Possible esophageal dilatation with ERCP postponed until tomorrow due to lack of anesthesia availability in endo today  -Full liquid diet today then NPO after midnight  -Eventual laparoscopic cholecystectomy, general surgery following   2) Leukocytosis secondary to # 1, resolved. Remains on Zosyn IV.     3)  History of GERD, esophageal stricture -Pantoprazole 40 mg IV every 24 hours   4) CTAP identified a 5 mm right lower lobe nodule - Follow up with PCP as outpatient    5) Hyponatremia. Na +131 -> 130 -> 133. -IV fluids reduced to 100cclhr  on 8/17 -Repeat BMP in a.m.  7) Normocytic anemia, no overt GI bleeding, likely dilutional component. Hg 10.2 -> 11.3. -CBC in am   8) History of polymyalgia rheumatica    Principal Problem:   Gallstone pancreatitis Active Problems:   Hypothyroidism, postsurgical   Essential hypertension  Choledocholithiasis     LOS: 2 days   [name] [name]  04/22/2021, 10:18 AM   Attending physician's note   I have taken an interval history, reviewed the chart and examined the patient. I agree with the Advanced Practitioner's note, impression and recommendations.   Patient doing well.  No nausea/vomiting or abdominal pain.  ERCP could not be performed today d/t scheduling constraints.  Has been scheduled for tomorrow.  Would need EGD with balloon dilatation prior.  Carmell Austria, MD [facility] [phone]

## 2021-04-22 NOTE — Progress Notes (Deleted)
Transported off unit at 2300 via care link

## 2021-04-23 ENCOUNTER — Inpatient Hospital Stay (HOSPITAL_COMMUNITY): Payer: Medicare Other | Admitting: Anesthesiology

## 2021-04-23 ENCOUNTER — Inpatient Hospital Stay (HOSPITAL_COMMUNITY): Payer: Medicare Other

## 2021-04-23 ENCOUNTER — Encounter (HOSPITAL_COMMUNITY)
Admission: EM | Disposition: A | Discharge status: Home / Self Care | Payer: Self-pay | Source: Home / Self Care | Attending: Family Medicine

## 2021-04-23 DIAGNOSIS — I10 Essential (primary) hypertension: Secondary | ICD-10-CM | POA: Diagnosis not present

## 2021-04-23 DIAGNOSIS — R1310 Dysphagia, unspecified: Secondary | ICD-10-CM

## 2021-04-23 DIAGNOSIS — K8051 Calculus of bile duct without cholangitis or cholecystitis with obstruction: Secondary | ICD-10-CM

## 2021-04-23 DIAGNOSIS — K222 Esophageal obstruction: Secondary | ICD-10-CM

## 2021-04-23 DIAGNOSIS — K8050 Calculus of bile duct without cholangitis or cholecystitis without obstruction: Secondary | ICD-10-CM | POA: Diagnosis not present

## 2021-04-23 DIAGNOSIS — E890 Postprocedural hypothyroidism: Secondary | ICD-10-CM | POA: Diagnosis not present

## 2021-04-23 DIAGNOSIS — K851 Biliary acute pancreatitis without necrosis or infection: Secondary | ICD-10-CM | POA: Diagnosis not present

## 2021-04-23 HISTORY — PX: BILIARY DILATION: SHX6850

## 2021-04-23 HISTORY — PX: SPHINCTEROTOMY: SHX5544

## 2021-04-23 HISTORY — PX: ERCP: SHX5425

## 2021-04-23 HISTORY — PX: REMOVAL OF STONES: SHX5545

## 2021-04-23 HISTORY — PX: ESOPHAGOGASTRODUODENOSCOPY (EGD) WITH PROPOFOL: SHX5813

## 2021-04-23 LAB — GLUCOSE, CAPILLARY
Glucose-Capillary: 115 mg/dL — ABNORMAL HIGH (ref 70–99)
Glucose-Capillary: 144 mg/dL — ABNORMAL HIGH (ref 70–99)
Glucose-Capillary: 159 mg/dL — ABNORMAL HIGH (ref 70–99)

## 2021-04-23 LAB — COMPREHENSIVE METABOLIC PANEL
ALT: 89 U/L — ABNORMAL HIGH (ref 0–44)
AST: 42 U/L — ABNORMAL HIGH (ref 15–41)
Albumin: 3.3 g/dL — ABNORMAL LOW (ref 3.5–5.0)
Alkaline Phosphatase: 85 U/L (ref 38–126)
Anion gap: 6 (ref 5–15)
BUN: <5 mg/dL — ABNORMAL LOW (ref 8–23)
CO2: 23 mmol/L (ref 22–32)
Calcium: 8.6 mg/dL — ABNORMAL LOW (ref 8.9–10.3)
Chloride: 105 mmol/L (ref 98–111)
Creatinine, Ser: 0.43 mg/dL — ABNORMAL LOW (ref 0.44–1.00)
GFR, Estimated: >60 mL/min (ref >60)
Glucose, Bld: 103 mg/dL — ABNORMAL HIGH (ref 70–99)
Potassium: 4 mmol/L (ref 3.5–5.1)
Sodium: 134 mmol/L — ABNORMAL LOW (ref 135–145)
Total Bilirubin: 1.2 mg/dL (ref 0.3–1.2)
Total Protein: 5.6 g/dL — ABNORMAL LOW (ref 6.5–8.1)

## 2021-04-23 LAB — CBC
HCT: 32.4 % — ABNORMAL LOW (ref 36.0–46.0)
Hemoglobin: 11.1 g/dL — ABNORMAL LOW (ref 12.0–15.0)
MCH: 31.6 pg (ref 26.0–34.0)
MCHC: 34.3 g/dL (ref 30.0–36.0)
MCV: 92.3 fL (ref 80.0–100.0)
Platelets: 201 10*3/uL (ref 150–400)
RBC: 3.51 MIL/uL — ABNORMAL LOW (ref 3.87–5.11)
RDW: 12.1 % (ref 11.5–15.5)
WBC: 5.6 10*3/uL (ref 4.0–10.5)
nRBC: 0 % (ref 0.0–0.2)

## 2021-04-23 LAB — LIPASE, BLOOD: Lipase: 70 U/L — ABNORMAL HIGH (ref 11–51)

## 2021-04-23 SURGERY — ERCP, WITH INTERVENTION IF INDICATED
Anesthesia: Monitor Anesthesia Care

## 2021-04-23 SURGERY — ESOPHAGOGASTRODUODENOSCOPY (EGD) WITH PROPOFOL
Anesthesia: Monitor Anesthesia Care

## 2021-04-23 MED ORDER — FENTANYL CITRATE (PF) 100 MCG/2ML IJ SOLN
INTRAMUSCULAR | Status: AC
  Filled 2021-04-23: qty 2

## 2021-04-23 MED ORDER — LACTATED RINGERS IV SOLN: INTRAVENOUS | Status: DC

## 2021-04-23 MED ORDER — DEXAMETHASONE SODIUM PHOSPHATE 10 MG/ML IJ SOLN
INTRAMUSCULAR | Status: DC | PRN
  Administered 2021-04-23: 4 mg via INTRAVENOUS

## 2021-04-23 MED ORDER — SUGAMMADEX SODIUM 200 MG/2ML IV SOLN
INTRAVENOUS | Status: DC | PRN
  Administered 2021-04-23: 100 mg via INTRAVENOUS
  Administered 2021-04-23: 200 mg via INTRAVENOUS

## 2021-04-23 MED ORDER — INDOMETHACIN 50 MG RE SUPP
RECTAL | Status: DC | PRN
  Administered 2021-04-23: 100 mg via RECTAL

## 2021-04-23 MED ORDER — FENTANYL CITRATE (PF) 100 MCG/2ML IJ SOLN
INTRAMUSCULAR | Status: DC | PRN
  Administered 2021-04-23: 50 ug via INTRAVENOUS
  Administered 2021-04-23 (×2): 25 ug via INTRAVENOUS

## 2021-04-23 MED ORDER — APREPITANT 40 MG PO CAPS
40.0000 mg | ORAL_CAPSULE | ORAL | Status: AC
  Administered 2021-04-23: 40 mg via ORAL
  Filled 2021-04-23: qty 1

## 2021-04-23 MED ORDER — PROPOFOL 10 MG/ML IV BOLUS
INTRAVENOUS | Status: DC | PRN
  Administered 2021-04-23: 20 mg via INTRAVENOUS
  Administered 2021-04-23: 100 mg via INTRAVENOUS

## 2021-04-23 MED ORDER — PROPOFOL 10 MG/ML IV BOLUS
INTRAVENOUS | Status: AC
  Filled 2021-04-23: qty 20

## 2021-04-23 MED ORDER — INDOMETHACIN 50 MG RE SUPP
RECTAL | Status: AC
  Filled 2021-04-23: qty 1

## 2021-04-23 MED ORDER — GLUCAGON HCL RDNA (DIAGNOSTIC) 1 MG IJ SOLR
INTRAMUSCULAR | Status: AC
  Filled 2021-04-23: qty 1

## 2021-04-23 MED ORDER — LOPERAMIDE HCL 2 MG PO CAPS
2.0000 mg | ORAL_CAPSULE | Freq: Once | ORAL | Status: AC
  Administered 2021-04-23: 2 mg via ORAL
  Filled 2021-04-23: qty 1

## 2021-04-23 MED ORDER — SODIUM CHLORIDE 0.9 % IV SOLN: INTRAVENOUS | Status: DC

## 2021-04-23 MED ORDER — INDOMETHACIN 50 MG RE SUPP: 100.0000 mg | Freq: Once | RECTAL | Status: DC

## 2021-04-23 MED ORDER — ROCURONIUM BROMIDE 100 MG/10ML IV SOLN
INTRAVENOUS | Status: DC | PRN
  Administered 2021-04-23: 50 mg via INTRAVENOUS

## 2021-04-23 MED ORDER — LIDOCAINE HCL (CARDIAC) PF 100 MG/5ML IV SOSY
PREFILLED_SYRINGE | INTRAVENOUS | Status: DC | PRN
  Administered 2021-04-23: 60 mg via INTRAVENOUS

## 2021-04-23 MED ORDER — LOPERAMIDE HCL 2 MG PO CAPS
2.0000 mg | ORAL_CAPSULE | Freq: Once | ORAL | Status: AC
  Administered 2021-04-24: 2 mg via ORAL
  Filled 2021-04-23: qty 1

## 2021-04-23 MED ORDER — EPHEDRINE SULFATE 50 MG/ML IJ SOLN
INTRAMUSCULAR | Status: DC | PRN
  Administered 2021-04-23: 5 mg via INTRAVENOUS

## 2021-04-23 MED ORDER — SODIUM CHLORIDE 0.9 % IV SOLN
INTRAVENOUS | Status: DC | PRN
  Administered 2021-04-23: 75 mL

## 2021-04-23 MED ORDER — ONDANSETRON HCL 4 MG/2ML IJ SOLN
INTRAMUSCULAR | Status: DC | PRN
  Administered 2021-04-23: 4 mg via INTRAVENOUS

## 2021-04-23 MED ORDER — TRAZODONE HCL 50 MG PO TABS
50.0000 mg | ORAL_TABLET | Freq: Once | ORAL | Status: AC
  Administered 2021-04-23: 50 mg via ORAL
  Filled 2021-04-23: qty 1

## 2021-04-23 MED ORDER — GLUCAGON HCL RDNA (DIAGNOSTIC) 1 MG IJ SOLR
INTRAMUSCULAR | Status: DC | PRN
  Administered 2021-04-23 (×2): .2 mg via INTRAVENOUS

## 2021-04-23 NOTE — Op Note (Addendum)
[facility] Patient Name: [name] [name] Procedure Date: 04/23/2021 MRN: [id number] Attending MD: Jackquline Denmark , MD Date of Birth: 1934/12/23 CSN: [id number] Age: 86 Admit Type: Inpatient Procedure:                ERCP Indications:              Bile duct stone(s), biliary pancreatitis Providers:                Jackquline Denmark, MD, Carlyn Reichert, RN, Benetta [name], Technician Referring MD:              Medicines:                GA, IV Zosyn, Indocin suppositories, IV glucagon. Complications:            No immediate complications. Estimated Blood Loss:     Estimated blood loss: none. Procedure:                Pre-Anesthesia Assessment:                           - Prior to the procedure, a History and Physical                            was performed, and patient medications and                            allergies were reviewed. The patient's tolerance of                            previous anesthesia was also reviewed. The risks                            and benefits of the procedure and the sedation                            options and risks were discussed with the patient.                            All questions were answered, and informed consent                            was obtained. Prior Anticoagulants: The patient has                            taken no previous anticoagulant or antiplatelet                            agents. ASA Grade Assessment: II - A patient with                            mild systemic disease. After reviewing the risks  and benefits, the patient was deemed in                            satisfactory condition to undergo the procedure.                           After obtaining informed consent, the scope was                            passed under direct vision. Throughout the                            procedure, the patient's blood pressure, pulse, and                             oxygen saturations were monitored continuously. The                            [name] [name] single use                            duodenoscope was introduced through the mouth, and                            used to inject contrast into and used to inject                            contrast into the bile duct. The ERCP was                            accomplished without difficulty. The patient                            tolerated the procedure well. Scope In: Scope Out: Findings:      The scout film was normal. The esophagus was successfully intubated       under direct vision. The scope was advanced to a major papilla in the       descending duodenum without detailed examination of the pharynx, larynx       and associated structures, and upper GI tract. The upper GI tract was       grossly normal.      Large periampullary diverticulum. The major pappila was normal with       opening towards the lower part of the diverticulum. The bile duct was       deeply cannulated with the short-nosed traction sphincterotome. Multiple       (8) faceted filling defects in the mid and lower CBD measuring between 6       mm -10 mm. CBD was dilated at 12 mm. Cystic duct was patent. Multiple       stones in the dilated gallbladder. Right and left hepatic ducts were       mildly dilated.      A 6 mm biliary sphincterotomy was made with a monofilament traction       (standard) sphincterotome using ERBE electrocautery. There was no  post-sphincterotomy bleeding. Dilation of the common bile duct with a       06-16-11 mm balloon (to a maximum balloon size of 12 mm) dilator was       successful. The biliary tree was swept with a 12 mm, then 37m balloon       starting at the bifurcation. All 8 stones were removed. Postocclusion       cholangiogram did not reveal any residual filling defects.      Pancreatic duct was intentionally not cannulated or injected. Impression:               -  Choledocholithiasis s/p biliary sphincterotomy,                            sphincteroplasty and balloon extraction.                           - Periampullary diverticulum                           - Cholelithiasis. Moderate Sedation:      Not Applicable - Patient had care per Anesthesia. Recommendation:           - Return patient to hospital ward for ongoing care.                           - Clear liquid diet.                           - Recommend lap chole. Surgery is on board already.                           - Watch for pancreatitis, bleeding, perforation,                            and cholangitis.                           - The findings and recommendations were discussed                            with the patient's family (son [name] Post. Procedure Code(s):        --- Professional ---                           [phone] Endoscopic retrograde                            cholangiopancreatography (ERCP); with                            trans-endoscopic balloon dilation of                            biliary/pancreatic duct(s) or of ampulla                            (sphincteroplasty), including sphincterotomy, when  performed, each duct                           [id number], Endoscopic retrograde                            cholangiopancreatography (ERCP); with removal of                            calculi/debris from biliary/pancreatic duct(s)                           [phone], Endoscopic catheterization of the biliary                            ductal system, radiological supervision and                            interpretation Diagnosis Code(s):        --- Professional ---                           K80.51, Calculus of bile duct without cholangitis                            or cholecystitis with obstruction CPT copyright 2019 [employer]. All rights reserved. The codes documented in this report are preliminary and upon coder review may  be revised  to meet current compliance requirements. Jackquline Denmark, MD 04/23/2021 2:21:14 PM This report has been signed electronically. Number of Addenda: 0

## 2021-04-23 NOTE — Interval H&P Note (Signed)
History and Physical Interval Note:  04/23/2021 12:09 PM  Purcell Nails  has presented today for surgery, with the diagnosis of Gallstone pancreatitis, intra and extra hepatic biliary ductal dilatation and choledocholithiasis.  The various methods of treatment have been discussed with the patient and family. After consideration of risks, benefits and other options for treatment, the patient has consented to  Procedure(s): ENDOSCOPIC RETROGRADE CHOLANGIOPANCREATOGRAPHY (ERCP) (N/A) as a surgical intervention.  The patient's history has been reviewed, patient examined, no change in status, stable for surgery.  I have reviewed the patient's chart and labs.  Questions were answered to the patient's satisfaction.    Prior to ERCP, she would need EGD with dilation  I have explained risks and benefits including small but definite risks of pancreatitis (<10%), bleeding (<1%), perforation (<1%).  The risks of general anesthesia were also discussed.  The benefits were also discussed.  Patient wishes to proceed.  All the questions were answered.    [name] [name]

## 2021-04-23 NOTE — Progress Notes (Signed)
Progress Note  Day of Surgery  Subjective: Just returned from ERCP. Has had large loose BM. No abdominal pain, nausea, emesis   Objective: Vital signs in last 24 hours: Temp:  [96.4 F (35.8 C)-98.2 F (36.8 C)] 97.7 F (36.5 C) (08/19 1510) Pulse Rate:  [69-96] 75 (08/19 1510) Resp:  [13-18] 18 (08/19 1510) BP: (139-171)/(63-84) 165/84 (08/19 1510) SpO2:  [93 %-100 %] 93 % (08/19 1510) Weight:  [68.1 kg] 68.1 kg (08/19 1124) Last BM Date: 04/21/21  Intake/Output from previous day: 08/18 0701 - 08/19 0700 In: 2507.9 [P.O.:240; I.V.:2131.5; IV Piggyback:136.4] Out: 800 [Urine:800] Intake/Output this shift: Total I/O In: 1570.2 [I.V.:1520.2; IV Piggyback:50] Out: 2 [Blood:2]  PE: General: pleasant, WD, female  HEENT: head is normocephalic, atraumatic. Mouth is pink and moist Lungs:Respiratory effort nonlabored Abd: soft, NT, ND MSK: all 4 extremities are symmetrical with no cyanosis, clubbing, or edema. Skin: warm and dry with no masses, lesions, or rashes Psych: A&Ox3 with an appropriate affect.    Lab Results:  Recent Labs    04/22/21 0851 04/23/21 0538  WBC 6.4 5.6  HGB 11.3* 11.1*  HCT 32.5* 32.4*  PLT 197 201   BMET Recent Labs    04/22/21 0851 04/23/21 0538  NA 133* 134*  K 4.1 4.0  CL 103 105  CO2 24 23  GLUCOSE 113* 103*  BUN <5* <5*  CREATININE 0.53 0.43*  CALCIUM 8.7* 8.6*   PT/INR Recent Labs    04/22/21 0851  LABPROT 14.1  INR 1.1   CMP     Component Value Date/Time   NA 134 (L) 04/23/2021 0538   K 4.0 04/23/2021 0538   CL 105 04/23/2021 0538   CO2 23 04/23/2021 0538   GLUCOSE 103 (H) 04/23/2021 0538   BUN <5 (L) 04/23/2021 0538   CREATININE 0.43 (L) 04/23/2021 0538   CREATININE 0.55 (L) 06/15/2020 1120   CALCIUM 8.6 (L) 04/23/2021 0538   PROT 5.6 (L) 04/23/2021 0538   ALBUMIN 3.3 (L) 04/23/2021 0538   AST 42 (H) 04/23/2021 0538   ALT 89 (H) 04/23/2021 0538   ALKPHOS 85 04/23/2021 0538   BILITOT 1.2 04/23/2021 0538    GFRNONAA >60 04/23/2021 0538   GFRAA 127 06/16/2008 0000   Lipase     Component Value Date/Time   LIPASE 70 (H) 04/23/2021 0538       Studies/Results: DG ERCP BILIARY & PANCREATIC DUCTS  Result Date: 04/23/2021 CLINICAL DATA:  86 year old female with a history of choledocholithiasis EXAM: ERCP TECHNIQUE: Multiple spot images obtained with the fluoroscopic device and submitted for interpretation post-procedure. FLUOROSCOPY TIME:  Fluoroscopy Time:  1 minutes 6 seconds COMPARISON:  CT 04/19/2021 FINDINGS: Limited images during ERCP. Initial image demonstrates the endoscope projecting over the upper abdomen. Subsequently there is cannulation of the ampulla with retrograde infusion of contrast. Partial opacification of the extrahepatic biliary system. Multiple geometric filling defects within the common bile duct. Subsequently there is placement of angioplasty balloon and a balloon retrieval catheter. IMPRESSION: Limited images during ERCP demonstrates treatment of choledocholithiasis with deployment of a retrieval balloon and angioplasty balloon. Please refer to the dictated operative report for full details of intraoperative findings and procedure. Electronically Signed   By: Corrie Mckusick D.O.   On: 04/23/2021 14:21    Anti-infectives: Anti-infectives (From admission, onward)    Start     Dose/Rate Route Frequency Ordered Stop   04/23/21 0600  ciprofloxacin (CIPRO) IVPB 400 mg  Status:  Discontinued  400 mg 200 mL/hr over 60 Minutes Intravenous On call to O.R. 04/22/21 0914 04/22/21 1516   04/23/21 0600  ciprofloxacin (CIPRO) IVPB 400 mg        400 mg 200 mL/hr over 60 Minutes Intravenous On call to O.R. 04/22/21 1516 04/24/21 0559   04/20/21 0800  piperacillin-tazobactam (ZOSYN) IVPB 3.375 g        3.375 g 12.5 mL/hr over 240 Minutes Intravenous Every 8 hours 04/20/21 0235     04/20/21 0015  piperacillin-tazobactam (ZOSYN) IVPB 3.375 g        3.375 g 100 mL/hr over 30 Minutes  Intravenous  Once 04/20/21 0012 04/20/21 0054        Assessment/Plan Gallstone pancreatitis/choledocholithiasis - ERCP done today - plan for lap chole tomorrow with Dr. Dema Severin I have explained the procedure, risks, and aftercare of cholecystectomy.  Risks include but are not limited to bleeding, infection, wound problems, anesthesia, diarrhea, bile leak, injury to common bile duct/liver/intestine. She seems to understand and agrees to proceed. All questions answered  FEN: NPO MN ID: cipro on call to OR VTE: SCDs   LOS: 3 days    Winferd Humphrey, [facility] 04/23/2021, 4:36 PM Please see Amion for pager number during day hours 7:00am-4:30pm

## 2021-04-23 NOTE — Transfer of Care (Signed)
Immediate Anesthesia Transfer of Care Note  Patient: [name] [name]  Procedure(s) Performed: ENDOSCOPIC RETROGRADE CHOLANGIOPANCREATOGRAPHY (ERCP) SPHINCTEROTOMY BILIARY DILATION REMOVAL OF STONES ESOPHAGOGASTRODUODENOSCOPY (EGD) WITH PROPOFOL ESOPHAGEAL DILITATION  Patient Location: PACU and Endoscopy Unit  Anesthesia Type:General  Level of Consciousness: oriented, drowsy and patient cooperative  Airway & Oxygen Therapy: Patient Spontanous Breathing and Patient connected to face mask oxygen  Post-op Assessment: Report given to RN  Post vital signs: Reviewed and stable  Last Vitals:  Vitals Value Taken Time  BP 149/64 04/23/21 1418  Temp 36.6 C 04/23/21 1418  Pulse 89 04/23/21 1419  Resp 13 04/23/21 1419  SpO2 100 % 04/23/21 1419  Vitals shown include unvalidated device data.  Last Pain:  Vitals:   04/23/21 1418  TempSrc: Axillary  PainSc:       Patients Stated Pain Goal: 0 ([id number])  Complications: No notable events documented.

## 2021-04-23 NOTE — Anesthesia Procedure Notes (Signed)
Procedure Name: Intubation Date/Time: 04/23/2021 12:53 PM Performed by: Garrel Ridgel, CRNA Pre-anesthesia Checklist: Patient identified, Emergency Drugs available, Suction available and Patient being monitored Patient Re-evaluated:Patient Re-evaluated prior to induction Oxygen Delivery Method: Circle system utilized Preoxygenation: Pre-oxygenation with 100% oxygen Induction Type: IV induction Ventilation: Mask ventilation without difficulty Laryngoscope Size: Mac and 3 Grade View: Grade III Tube type: Oral Tube size: 7.0 mm Number of attempts: 2 Airway Equipment and Method: Oral airway and Bougie stylet Placement Confirmation: ETT inserted through vocal cords under direct vision, positive ETCO2 and breath sounds checked- equal and bilateral Tube secured with: Tape Dental Injury: Teeth and Oropharynx as per pre-operative assessment

## 2021-04-23 NOTE — Op Note (Signed)
[facility] Patient Name: [name] [name] Procedure Date: 04/23/2021 MRN: [id number] Attending MD: Jackquline Denmark , MD Date of Birth: 09-Dec-1934 CSN: [id number] Age: 86 Admit Type: Inpatient Procedure:                Upper GI endoscopy Indications:              Dysphagia. EGD was performed just prior to ERCP. Providers:                Jackquline Denmark, MD, Carlyn Reichert, RN, Benetta Spar, Technician Referring MD:              Medicines:                General Anesthesia Complications:            No immediate complications. Estimated Blood Loss:     Estimated blood loss: none. Procedure:                Pre-Anesthesia Assessment:                           - Prior to the procedure, a History and Physical                            was performed, and patient medications and                            allergies were reviewed. The patient's tolerance of                            previous anesthesia was also reviewed. The risks                            and benefits of the procedure and the sedation                            options and risks were discussed with the patient.                            All questions were answered, and informed consent                            was obtained. Prior Anticoagulants: The patient has                            taken no previous anticoagulant or antiplatelet                            agents. ASA Grade Assessment: II - A patient with                            mild systemic disease. After reviewing the risks  and benefits, the patient was deemed in                            satisfactory condition to undergo the procedure.                           After obtaining informed consent, the endoscope was                            passed under direct vision. Throughout the                            procedure, the patient's blood pressure, pulse, and                            oxygen  saturations were monitored continuously. The                            [id number]) Olympus endoscope was introduced                            through the mouth, and advanced to the second part                            of duodenum. The upper GI endoscopy was                            accomplished without difficulty. The patient                            tolerated the procedure well. Scope In: Scope Out: Findings:      One benign-appearing, moderate Schatzki's ring (circumferential scarring       or stenosis; an endoscope may pass) stenosis was found 36 cm from the       incisors at the GE junction with luminal diameter of 10 mm. A TTS       dilator was passed through the scope. Dilation with a 12-13.5-15 mm       balloon dilator was performed to 15 mm. The dilation site was examined       and showed moderate mucosal disruption and near complete improvement.       Estimated blood loss: none.      A small hiatal hernia was present. Gastric mucosa otherwise was       unremarkable. The retroflexed examination of the cardia was normal.      The examined duodenum was normal. Impression:               - Schatzki's ring s/p esophageal dilatation                           - Small hiatal hernia.                           - Normal examined duodenum.                           -  No specimens collected. Moderate Sedation:      Not Applicable - Patient had care per Anesthesia. Recommendation:           - Use Protonix (pantoprazole) 40 mg PO daily.                           - Chew foods specially meats and breads well and                            eat slowly.                           - Proceed with ERCP. Procedure Code(s):        --- Professional ---                           [phone], Esophagogastroduodenoscopy, flexible,                            transoral; with transendoscopic balloon dilation of                            esophagus (less than 30 mm diameter) Diagnosis Code(s):         --- Professional ---                           K22.2, Esophageal obstruction                           K44.9, Diaphragmatic hernia without obstruction or                            gangrene                           R13.10, Dysphagia, unspecified CPT copyright 2019 American Medical Association. All rights reserved. The codes documented in this report are preliminary and upon coder review may  be revised to meet current compliance requirements. Jackquline Denmark, MD 04/23/2021 2:26:43 PM This report has been signed electronically. Number of Addenda: 0

## 2021-04-23 NOTE — Progress Notes (Signed)
[facility]  PROGRESS NOTE  [name] [name] [id number] DOB: April 07, 1935 DOA: 04/19/2021 PCP: [name], MD   Brief HPI:   86 year old female with history of hypertension, postsurgical hypothyroidism presented to ED after starting having abdominal pain at home.  In the ED LFTs were elevated, lipase 4300.  CT abdomen pelvis showed features concerning for gallstone pancreatitis with choledocholithiasis and multiple gallstones in the distal common bile duct.  IV hydration and IV antibiotic started.  Gastroenterology was consulted    Subjective   Patient seen and examined, denies any complaints.  Awaiting to get ERCP today.   Assessment/Plan:     Gallstone pancreatitis -CT scan shows choledocholithiasis multiple distal CBD stones with possible acute cholecystitis -General surgery and GI following -Plan for ERCP today -Plan for cholecystectomy after ERCP -General surgery following  Hypertension -Patient is n.p.o. -Continue IV as needed labetalol  Hypokalemia -Replete  Hypomagnesemia -Magnesium was replaced  Hyponatremia -Sodium has improved to 134  History of postoperative hypothyroidism -Continue IV Synthroid  CT scan also showed cyst in the distal lateral area -Benign cyst, described on remote 2001 pelvic MRI -Follow-up monitoring as outpatient    Scheduled medications:    indomethacin  100 mg Rectal Once   levothyroxine  44 mcg Intravenous Daily   pantoprazole (PROTONIX) IV  40 mg Intravenous QHS         Data Reviewed:   CBG:  Recent Labs  Lab 04/22/21 0006 04/22/21 0821 04/22/21 1615 04/22/21 2346 04/23/21 0745  GLUCAP 103* 114* 95 91 115*    SpO2: 93 % O2 Flow Rate (L/min): 6 L/min    Vitals:   04/23/21 1418 04/23/21 1420 04/23/21 1430 04/23/21 1510  BP: (!) 149/64 139/69  (!) 165/84  Pulse: 96 89 77 75  Resp: '13 13 13 18  '$ Temp: 97.8 F (36.6 C)   97.7 F (36.5 C)  TempSrc: Axillary   Oral  SpO2: 100% 100% 96% 93%   Weight:      Height:         Intake/Output Summary (Last 24 hours) at 04/23/2021 1601 Last data filed at 04/23/2021 1500 Gross per 24 hour  Intake 3838.12 ml  Output 2 ml  Net 3836.12 ml    08/17 1901 - 08/19 0700 In: 4252.5 [P.O.:240; I.V.:3625.1] Out: 800 [Urine:800]  Filed Weights   04/22/21 0417 04/23/21 0500 04/23/21 1124  Weight: 70.6 kg 68.1 kg 68.1 kg    CBC:  Recent Labs  Lab 04/19/21 2120 04/20/21 0412 04/21/21 0525 04/22/21 0851 04/23/21 0538  WBC 15.3* 20.8* 11.9* 6.4 5.6  HGB 14.2 11.9* 10.2* 11.3* 11.1*  HCT 41.1 33.7* 29.8* 32.5* 32.4*  PLT 272 204 168 197 201  MCV 91.7 91.6 94.3 92.6 92.3  MCH 31.7 32.3 32.3 32.2 31.6  MCHC 34.5 35.3 34.2 34.8 34.3  RDW 11.6 11.7 12.1 12.0 12.1  LYMPHSABS  --  0.7  --   --   --   MONOABS  --  1.0  --   --   --   EOSABS  --  0.0  --   --   --   BASOSABS  --  0.0  --   --   --     Complete metabolic panel:  Recent Labs  Lab 04/19/21 2120 04/20/21 0004 04/20/21 0201 04/20/21 0412 04/20/21 0600 04/20/21 0800 04/20/21 1157 04/21/21 0525 04/22/21 0851 04/23/21 0538  NA 130*  --   --  131*  --   --   --  130* 133* 134*  K 3.4*  --   --  3.8  --   --   --  3.2* 4.1 4.0  CL 92*  --   --  95*  --   --   --  98 103 105  CO2 26  --   --  26  --   --   --  '25 24 23  '$ GLUCOSE 148*  --   --  149*  --   --   --  93 113* 103*  BUN 10  --   --  9  --   --   --  11 <5* <5*  CREATININE 0.55  --   --  0.59  --   --   --  0.49 0.53 0.43*  CALCIUM 9.4  --   --  8.7*  --   --   --  8.0* 8.7* 8.6*  AST 592*  --   --  606*  --   --   --  178* 78* 42*  ALT 231*  --   --  372*  --   --   --  183* 127* 89*  ALKPHOS 113  --   --  104  --   --   --  85 96 85  BILITOT 1.6*  --   --  2.6*  --   --   --  3.0* 1.3* 1.2  ALBUMIN 4.6  --   --  3.4*  --   --   --  2.8* 3.4* 3.3*  MG  --   --   --   --   --   --   --  1.7  --   --   LATICACIDVEN  --  4.3* 4.0*  --  3.9* 4.0* 1.5  --   --   --   INR  --   --   --   --   --   --   --    --  1.1  --     Recent Labs  Lab 04/19/21 2120 04/20/21 0412 04/21/21 0525 04/22/21 0851 04/23/21 0538  LIPASE 4,307* 1,041* 268* 99* 70*    Recent Labs  Lab 04/20/21 0004  SARSCOV2NAA NEGATIVE    ------------------------------------------------------------------------------------------------------------------ No results for input(s): CHOL, HDL, LDLCALC, TRIG, CHOLHDL, LDLDIRECT in the last 72 hours.  Lab Results  Component Value Date   HGBA1C 5.7 10/02/2006   ------------------------------------------------------------------------------------------------------------------ No results for input(s): TSH, T4TOTAL, T3FREE, THYROIDAB in the last 72 hours.  Invalid input(s): FREET3 ------------------------------------------------------------------------------------------------------------------ No results for input(s): VITAMINB12, FOLATE, FERRITIN, TIBC, IRON, RETICCTPCT in the last 72 hours.  Coagulation profile Recent Labs  Lab 04/22/21 0851  INR 1.1   No results for input(s): DDIMER in the last 72 hours.  Cardiac Enzymes No results for input(s): CKTOTAL, CKMB, CKMBINDEX, TROPONINI in the last 168 hours.  ------------------------------------------------------------------------------------------------------------------ No results found for: BNP   Antibiotics: Anti-infectives (From admission, onward)    Start     Dose/Rate Route Frequency Ordered Stop   04/23/21 0600  ciprofloxacin (CIPRO) IVPB 400 mg  Status:  Discontinued        400 mg 200 mL/hr over 60 Minutes Intravenous On call to O.R. 04/22/21 0914 04/22/21 1516   04/23/21 0600  ciprofloxacin (CIPRO) IVPB 400 mg        400 mg 200 mL/hr over 60 Minutes Intravenous On call to O.R. 04/22/21 1516 04/24/21 0559   04/20/21 0800  piperacillin-tazobactam (ZOSYN) IVPB 3.375 g  3.375 g 12.5 mL/hr over 240 Minutes Intravenous Every 8 hours 04/20/21 0235     04/20/21 0015  piperacillin-tazobactam (ZOSYN) IVPB  3.375 g        3.375 g 100 mL/hr over 30 Minutes Intravenous  Once 04/20/21 0012 04/20/21 0054        Radiology Reports  DG ERCP BILIARY & PANCREATIC DUCTS  Result Date: 04/23/2021 CLINICAL DATA:  86 year old female with a history of choledocholithiasis EXAM: ERCP TECHNIQUE: Multiple spot images obtained with the fluoroscopic device and submitted for interpretation post-procedure. FLUOROSCOPY TIME:  Fluoroscopy Time:  1 minutes 6 seconds COMPARISON:  CT 04/19/2021 FINDINGS: Limited images during ERCP. Initial image demonstrates the endoscope projecting over the upper abdomen. Subsequently there is cannulation of the ampulla with retrograde infusion of contrast. Partial opacification of the extrahepatic biliary system. Multiple geometric filling defects within the common bile duct. Subsequently there is placement of angioplasty balloon and a balloon retrieval catheter. IMPRESSION: Limited images during ERCP demonstrates treatment of choledocholithiasis with deployment of a retrieval balloon and angioplasty balloon. Please refer to the dictated operative report for full details of intraoperative findings and procedure. Electronically Signed   By: Corrie Mckusick D.O.   On: 04/23/2021 14:21      DVT prophylaxis: SCDs  Code Status: Full code  Family Communication: No family at bedside   Consultants:   Procedures:     Objective    Physical Examination:  General-appears in no acute distress Heart-S1-S2, regular, no murmur auscultated Lungs-clear to auscultation bilaterally, no wheezing or crackles auscultated Abdomen-soft, nontender, no organomegaly Extremities-no edema in the lower extremities Neuro-alert, oriented x3, no focal deficit noted   Status is: Inpatient  Dispo: The patient is from: Home              Anticipated d/c is to: Home              Anticipated d/c date is: 04/27/2021              Patient currently not stable for discharge  Barrier to discharge-gallstone  pancreatitis, will need cholecystectomy  COVID-19 Labs  No results for input(s): DDIMER, FERRITIN, LDH, CRP in the last 72 hours.  Lab Results  Component Value Date   [facility] NEGATIVE 04/20/2021    Microbiology  Recent Results (from the past 240 hour(s))  Resp Panel by RT-PCR (Flu A&B, Covid) Nasopharyngeal Swab     Status: None   Collection Time: 04/20/21 12:04 AM   Specimen: Nasopharyngeal Swab; Nasopharyngeal(NP) swabs in vial transport medium  Result Value Ref Range Status   SARS Coronavirus 2 by RT PCR NEGATIVE NEGATIVE Final    Comment: (NOTE) SARS-CoV-2 target nucleic acids are NOT DETECTED.  The SARS-CoV-2 RNA is generally detectable in upper respiratory specimens during the acute phase of infection. The lowest concentration of SARS-CoV-2 viral copies this assay can detect is 138 copies/mL. A negative result does not preclude SARS-Cov-2 infection and should not be used as the sole basis for treatment or other patient management decisions. A negative result may occur with  improper specimen collection/handling, submission of specimen other than nasopharyngeal swab, presence of viral mutation(s) within the areas targeted by this assay, and inadequate number of viral copies(<138 copies/mL). A negative result must be combined with clinical observations, patient history, and epidemiological information. The expected result is Negative.  Fact Sheet for Patients:  EntrepreneurPulse.com.au  Fact Sheet for Healthcare Providers:  IncredibleEmployment.be  This test is no t yet approved or cleared by the [place]  FDA and  has been authorized for detection and/or diagnosis of SARS-CoV-2 by FDA under an Emergency Use Authorization (EUA). This EUA will remain  in effect (meaning this test can be used) for the duration of the COVID-19 declaration under Section 564(b)(1) of the Act, 21 U.S.C.section 360bbb-3(b)(1), unless the  authorization is terminated  or revoked sooner.       Influenza A by PCR NEGATIVE NEGATIVE Final   Influenza B by PCR NEGATIVE NEGATIVE Final    Comment: (NOTE) The Xpert Xpress SARS-CoV-2/FLU/RSV plus assay is intended as an aid in the diagnosis of influenza from Nasopharyngeal swab specimens and should not be used as a sole basis for treatment. Nasal washings and aspirates are unacceptable for Xpert Xpress SARS-CoV-2/FLU/RSV testing.  Fact Sheet for Patients: EntrepreneurPulse.com.au  Fact Sheet for Healthcare Providers: IncredibleEmployment.be  This test is not yet approved or cleared by the [place] FDA and has been authorized for detection and/or diagnosis of SARS-CoV-2 by FDA under an Emergency Use Authorization (EUA). This EUA will remain in effect (meaning this test can be used) for the duration of the COVID-19 declaration under Section 564(b)(1) of the Act, 21 U.S.C. section 360bbb-3(b)(1), unless the authorization is terminated or revoked.  Performed at [facility], [place] [address], [place],  [address]              Oswald Hillock   [facility] If 7PM-7AM, please contact night-coverage at [email or web address], Office  [phone]   04/23/2021, 4:01 PM  LOS: 3 days

## 2021-04-23 NOTE — Progress Notes (Signed)
     [name] [name] [facility] Progress Note  CC:  Pancreatitis, choledocholithiasis    Subjective:  She is NPO for planned EGD with esophageal dilatation and ERCP this afternoon. She didn't sleep well last night due to a lot of hospital noise. No N/V. No CP or SOB. No abdominal pain. She passed a few loose nonbloody stools this am.    Objective:  Vital signs in last 24 hours: Temp:  [97.5 F (36.4 C)-98.2 F (36.8 C)] 97.5 F (36.4 C) (08/19 0452) Pulse Rate:  [69-80] 72 (08/19 0452) Resp:  [16] 16 (08/19 0452) BP: (144-156)/(69-76) 146/76 (08/19 0452) SpO2:  [97 %-99 %] 97 % (08/19 0452) Weight:  [68.1 kg] 68.1 kg (08/19 0500) Last BM Date: 04/21/21 General:   Alert in NAD.  Heart: RRR, no murmur.  Pulm: Breath sounds clear throughout.  Abdomen: Soft, nontender. + BS x 4 quads.  Extremities:  Without edema. Neurologic:  Alert and  oriented x4. Grossly normal neurologically. Psych:  Alert and cooperative. Normal mood and affect.  Intake/Output from previous day: 08/18 0701 - 08/19 0700 In: 2507.9 [P.O.:240; I.V.:2131.5; IV Piggyback:136.4] Out: 800 [Urine:800] Intake/Output this shift: No intake/output data recorded.  Lab Results: Recent Labs    04/21/21 0525 04/22/21 0851 04/23/21 0538  WBC 11.9* 6.4 5.6  HGB 10.2* 11.3* 11.1*  HCT 29.8* 32.5* 32.4*  PLT 168 197 201   BMET Recent Labs    04/21/21 0525 04/22/21 0851 04/23/21 0538  NA 130* 133* 134*  K 3.2* 4.1 4.0  CL 98 103 105  CO2 '25 24 23  '$ GLUCOSE 93 113* 103*  BUN 11 <5* <5*  CREATININE 0.49 0.53 0.43*  CALCIUM 8.0* 8.7* 8.6*   LFT Recent Labs    04/23/21 0538  PROT 5.6*  ALBUMIN 3.3*  AST 42*  ALT 89*  ALKPHOS 85  BILITOT 1.2   PT/INR Recent Labs    04/22/21 0851  LABPROT 14.1  INR 1.1   Hepatitis Panel No results for input(s): HEPBSAG, HCVAB, HEPAIGM, HEPBIGM in the last 72 hours.  No results found.  Assessment / Plan:  78) 86 year old female with RUQ pain, elevated LFTs  and lipase level.  CTAP confirmed gallstones, choledocholithiasis with intra and extrahepatic biliary ductal dilatation. LFTs drifting downward.  Lipase 4,307 -> 268 -> 99. No N/V. No abdominal pain. NPO.  -Proceed with ERCP today as planned  -NPO -Laparoscopic cholecystectomy planned for tomorrow  -Hepatic panel, lipase and CBC in am    2) Leukocytosis secondary to # 1, resolved. Remains on Zosyn IV.     3)  History of GERD, esophageal stricture -Proceed with EGD with possible esophageal dilatation to facilitate passage of ERCP scope today as planned  -Pantoprazole 40 mg IV every 24 hours   4) CTAP identified a 5 mm right lower lobe nodule - Follow up with PCP as outpatient    5) Hyponatremia. Na +131 -> 130 -> 133 -> 134.   6) Normocytic anemia, no overt GI bleeding, likely dilutional component. Hg 10.2 -> 11.3 -> 11.1. -CBC in am   7) History of polymyalgia rheumatica  Principal Problem:   Gallstone pancreatitis Active Problems:   Hypothyroidism, postsurgical   Essential hypertension   Choledocholithiasis     LOS: 3 days   Noralyn Pick  04/23/2021, 10:11 AM

## 2021-04-23 NOTE — Anesthesia Preprocedure Evaluation (Addendum)
Anesthesia Evaluation  Patient identified by MRN, date of birth, ID band Patient awake    Reviewed: Allergy & Precautions, NPO status , Patient's Chart, lab work & pertinent test results  History of Anesthesia Complications (+) PONV  Airway Mallampati: II  TM Distance: >3 FB Neck ROM: Full    Dental  (+) Partial Upper   Pulmonary neg pulmonary ROS,    Pulmonary exam normal        Cardiovascular hypertension, Pt. on medications and Pt. on home beta blockers  Rhythm:Regular Rate:Normal     Neuro/Psych negative neurological ROS  negative psych ROS   GI/Hepatic Neg liver ROS, GERD  Medicated,Gallstone pancreatitis    Endo/Other  Hypothyroidism   Renal/GU   negative genitourinary   Musculoskeletal PMR   Abdominal (+)  Abdomen: soft. Bowel sounds: normal.  Peds  Hematology negative hematology ROS (+)   Anesthesia Other Findings   Reproductive/Obstetrics                            Anesthesia Physical Anesthesia Plan  ASA: 2  Anesthesia Plan: General   Post-op Pain Management:    Induction: Intravenous  PONV Risk Score and Plan: 4 or greater and Propofol infusion, Treatment may vary due to age or medical condition, Ondansetron, Aprepitant and Dexamethasone  Airway Management Planned:   Additional Equipment: None  Intra-op Plan:   Post-operative Plan: Extubation in OR  Informed Consent: I have reviewed the patients History and Physical, chart, labs and discussed the procedure including the risks, benefits and alternatives for the proposed anesthesia with the patient or authorized representative who has indicated his/her understanding and acceptance.     Dental advisory given  Plan Discussed with: CRNA  Anesthesia Plan Comments: (Lab Results      Component                Value               Date                      WBC                      5.6                 04/23/2021                 HGB                      11.1 (L)            04/23/2021                HCT                      32.4 (L)            04/23/2021                MCV                      92.3                04/23/2021                PLT                      201  04/23/2021           Lab Results      Component                Value               Date                      NA                       134 (L)             04/23/2021                K                        4.0                 04/23/2021                CO2                      23                  04/23/2021                GLUCOSE                  103 (H)             04/23/2021                BUN                      <5 (L)              04/23/2021                CREATININE               0.43 (L)            04/23/2021                CALCIUM                  8.6 (L)             04/23/2021                GFRNONAA                 >60                 04/23/2021                GFRAA                    127                 06/16/2008          )      Anesthesia Quick Evaluation

## 2021-04-24 ENCOUNTER — Encounter (HOSPITAL_COMMUNITY)
Admission: EM | Disposition: A | Discharge status: Home / Self Care | Payer: Self-pay | Source: Home / Self Care | Attending: Family Medicine

## 2021-04-24 ENCOUNTER — Inpatient Hospital Stay (HOSPITAL_COMMUNITY): Payer: Medicare Other | Admitting: Certified Registered Nurse Anesthetist

## 2021-04-24 DIAGNOSIS — I10 Essential (primary) hypertension: Secondary | ICD-10-CM | POA: Diagnosis not present

## 2021-04-24 DIAGNOSIS — K851 Biliary acute pancreatitis without necrosis or infection: Secondary | ICD-10-CM | POA: Diagnosis not present

## 2021-04-24 HISTORY — PX: CHOLECYSTECTOMY: SHX55

## 2021-04-24 LAB — CBC
HCT: 35.6 % — ABNORMAL LOW (ref 36.0–46.0)
Hemoglobin: 12.1 g/dL (ref 12.0–15.0)
MCH: 32.1 pg (ref 26.0–34.0)
MCHC: 34 g/dL (ref 30.0–36.0)
MCV: 94.4 fL (ref 80.0–100.0)
Platelets: 234 10*3/uL (ref 150–400)
RBC: 3.77 MIL/uL — ABNORMAL LOW (ref 3.87–5.11)
RDW: 12 % (ref 11.5–15.5)
WBC: 6.1 10*3/uL (ref 4.0–10.5)
nRBC: 0 % (ref 0.0–0.2)

## 2021-04-24 LAB — BASIC METABOLIC PANEL
Anion gap: 9 (ref 5–15)
BUN: 6 mg/dL — ABNORMAL LOW (ref 8–23)
CO2: 21 mmol/L — ABNORMAL LOW (ref 22–32)
Calcium: 8.7 mg/dL — ABNORMAL LOW (ref 8.9–10.3)
Chloride: 102 mmol/L (ref 98–111)
Creatinine, Ser: 0.5 mg/dL (ref 0.44–1.00)
GFR, Estimated: >60 mL/min (ref >60)
Glucose, Bld: 137 mg/dL — ABNORMAL HIGH (ref 70–99)
Potassium: 4.4 mmol/L (ref 3.5–5.1)
Sodium: 132 mmol/L — ABNORMAL LOW (ref 135–145)

## 2021-04-24 LAB — GLUCOSE, CAPILLARY: Glucose-Capillary: 182 mg/dL — ABNORMAL HIGH (ref 70–99)

## 2021-04-24 SURGERY — LAPAROSCOPIC CHOLECYSTECTOMY
Anesthesia: General | Site: Abdomen

## 2021-04-24 MED ORDER — METOPROLOL TARTRATE 25 MG PO TABS
25.0000 mg | ORAL_TABLET | Freq: Two times a day (BID) | ORAL | Status: DC
  Administered 2021-04-24 – 2021-04-25 (×3): 25 mg via ORAL
  Filled 2021-04-24 (×3): qty 1

## 2021-04-24 MED ORDER — BUPIVACAINE-EPINEPHRINE (PF) 0.25% -1:200000 IJ SOLN
INTRAMUSCULAR | Status: AC
  Filled 2021-04-24: qty 30

## 2021-04-24 MED ORDER — DEXAMETHASONE SODIUM PHOSPHATE 10 MG/ML IJ SOLN
INTRAMUSCULAR | Status: DC | PRN
  Administered 2021-04-24: 5 mg via INTRAVENOUS

## 2021-04-24 MED ORDER — FENTANYL CITRATE (PF) 100 MCG/2ML IJ SOLN: 25.0000 ug | INTRAMUSCULAR | Status: DC | PRN

## 2021-04-24 MED ORDER — TRAZODONE HCL 50 MG PO TABS
50.0000 mg | ORAL_TABLET | Freq: Once | ORAL | Status: AC
  Administered 2021-04-24: 50 mg via ORAL
  Filled 2021-04-24: qty 1

## 2021-04-24 MED ORDER — ROCURONIUM BROMIDE 10 MG/ML (PF) SYRINGE
PREFILLED_SYRINGE | INTRAVENOUS | Status: DC | PRN
  Administered 2021-04-24: 50 mg via INTRAVENOUS

## 2021-04-24 MED ORDER — LEVOTHYROXINE SODIUM 88 MCG PO TABS
88.0000 ug | ORAL_TABLET | Freq: Every day | ORAL | Status: DC
  Administered 2021-04-25: 88 ug via ORAL
  Filled 2021-04-24: qty 1

## 2021-04-24 MED ORDER — LIDOCAINE 2% (20 MG/ML) 5 ML SYRINGE
INTRAMUSCULAR | Status: DC | PRN
  Administered 2021-04-24: 60 mg via INTRAVENOUS

## 2021-04-24 MED ORDER — 0.9 % SODIUM CHLORIDE (POUR BTL) OPTIME
TOPICAL | Status: DC | PRN
  Administered 2021-04-24: 1000 mL

## 2021-04-24 MED ORDER — ONDANSETRON HCL 4 MG/2ML IJ SOLN
INTRAMUSCULAR | Status: AC
  Filled 2021-04-24: qty 2

## 2021-04-24 MED ORDER — ACETAMINOPHEN 10 MG/ML IV SOLN
INTRAVENOUS | Status: DC | PRN
  Administered 2021-04-24: 1000 mg via INTRAVENOUS

## 2021-04-24 MED ORDER — HYDROCODONE-ACETAMINOPHEN 5-325 MG PO TABS
1.0000 | ORAL_TABLET | Freq: Four times a day (QID) | ORAL | Status: DC | PRN
  Administered 2021-04-24 (×2): 1 via ORAL
  Filled 2021-04-24 (×2): qty 1

## 2021-04-24 MED ORDER — PROPOFOL 10 MG/ML IV BOLUS
INTRAVENOUS | Status: DC | PRN
Start: 2021-04-24 — End: 2021-04-24
  Administered 2021-04-24: 100 mg via INTRAVENOUS

## 2021-04-24 MED ORDER — POTASSIUM CHLORIDE IN NACL 40-0.9 MEQ/L-% IV SOLN
INTRAVENOUS | Status: AC
  Filled 2021-04-24 (×3): qty 1000

## 2021-04-24 MED ORDER — BUPIVACAINE-EPINEPHRINE 0.25% -1:200000 IJ SOLN
INTRAMUSCULAR | Status: DC | PRN
  Administered 2021-04-24: 30 mL

## 2021-04-24 MED ORDER — ONDANSETRON HCL 4 MG/2ML IJ SOLN
INTRAMUSCULAR | Status: DC | PRN
  Administered 2021-04-24: 4 mg via INTRAVENOUS

## 2021-04-24 MED ORDER — PROPOFOL 10 MG/ML IV BOLUS
INTRAVENOUS | Status: AC
  Filled 2021-04-24: qty 20

## 2021-04-24 MED ORDER — FENTANYL CITRATE (PF) 100 MCG/2ML IJ SOLN
INTRAMUSCULAR | Status: DC | PRN
  Administered 2021-04-24 (×3): 50 ug via INTRAVENOUS

## 2021-04-24 MED ORDER — ACETAMINOPHEN 10 MG/ML IV SOLN
INTRAVENOUS | Status: AC
  Filled 2021-04-24: qty 100

## 2021-04-24 MED ORDER — APREPITANT 40 MG PO CAPS
ORAL_CAPSULE | ORAL | Status: AC
  Administered 2021-04-24: 40 mg via ORAL
  Filled 2021-04-24: qty 1

## 2021-04-24 MED ORDER — APREPITANT 40 MG PO CAPS: 40.0000 mg | ORAL_CAPSULE | Freq: Once | ORAL | Status: AC

## 2021-04-24 MED ORDER — FENTANYL CITRATE (PF) 250 MCG/5ML IJ SOLN
INTRAMUSCULAR | Status: AC
  Filled 2021-04-24: qty 5

## 2021-04-24 MED ORDER — SUGAMMADEX SODIUM 200 MG/2ML IV SOLN
INTRAVENOUS | Status: DC | PRN
  Administered 2021-04-24: 200 mg via INTRAVENOUS

## 2021-04-24 MED ORDER — CEFAZOLIN SODIUM-DEXTROSE 2-4 GM/100ML-% IV SOLN
INTRAVENOUS | Status: AC
  Filled 2021-04-24: qty 100

## 2021-04-24 MED ORDER — CEFAZOLIN SODIUM-DEXTROSE 2-3 GM-%(50ML) IV SOLR
INTRAVENOUS | Status: DC | PRN
  Administered 2021-04-24: 2 g via INTRAVENOUS

## 2021-04-24 MED ORDER — LACTATED RINGERS IV SOLN
INTRAVENOUS | Status: AC | PRN
  Administered 2021-04-24: 1000 mL

## 2021-04-24 MED ORDER — MIDAZOLAM HCL 2 MG/2ML IJ SOLN
INTRAMUSCULAR | Status: AC
  Filled 2021-04-24: qty 2

## 2021-04-24 SURGICAL SUPPLY — 43 items
APPLICATOR ARISTA FLEXITIP XL (MISCELLANEOUS) IMPLANT
APPLIER CLIP 5 13 M/L LIGAMAX5 (MISCELLANEOUS) ×2
APPLIER CLIP ROT 10 11.4 M/L (STAPLE)
BAG COUNTER SPONGE SURGICOUNT (BAG) IMPLANT
CABLE HIGH FREQUENCY MONO STRZ (ELECTRODE) ×2 IMPLANT
CHLORAPREP W/TINT 26 (MISCELLANEOUS) ×2 IMPLANT
CLIP APPLIE 5 13 M/L LIGAMAX5 (MISCELLANEOUS) ×1 IMPLANT
CLIP APPLIE ROT 10 11.4 M/L (STAPLE) IMPLANT
COVER MAYO STAND STRL (DRAPES) IMPLANT
COVER SURGICAL LIGHT HANDLE (MISCELLANEOUS) ×2 IMPLANT
DECANTER SPIKE VIAL GLASS SM (MISCELLANEOUS) ×2 IMPLANT
DERMABOND ADVANCED (GAUZE/BANDAGES/DRESSINGS) ×1
DERMABOND ADVANCED .7 DNX12 (GAUZE/BANDAGES/DRESSINGS) ×1 IMPLANT
DISSECTOR BLUNT TIP ENDO 5MM (MISCELLANEOUS) IMPLANT
DRAPE C-ARM 42X120 X-RAY (DRAPES) IMPLANT
ELECT PENCIL ROCKER SW 15FT (MISCELLANEOUS) ×2 IMPLANT
ELECT REM PT RETURN 15FT ADLT (MISCELLANEOUS) ×2 IMPLANT
GLOVE SURG ENC MOIS LTX SZ7.5 (GLOVE) ×2 IMPLANT
GLOVE SURG UNDER LTX SZ8 (GLOVE) ×2 IMPLANT
GOWN STRL REUS W/TWL XL LVL3 (GOWN DISPOSABLE) ×4 IMPLANT
GRASPER SUT TROCAR 14GX15 (MISCELLANEOUS) IMPLANT
HEMOSTAT ARISTA ABSORB 3G PWDR (HEMOSTASIS) IMPLANT
HEMOSTAT SNOW SURGICEL 2X4 (HEMOSTASIS) IMPLANT
KIT BASIN OR (CUSTOM PROCEDURE TRAY) ×2 IMPLANT
NEEDLE INSUFFLATION 14GA 120MM (NEEDLE) IMPLANT
POUCH SPECIMEN RETRIEVAL 10MM (ENDOMECHANICALS) ×2 IMPLANT
RELOAD STAPLER BLUE 60MM (STAPLE) ×1 IMPLANT
SCISSORS LAP 5X35 DISP (ENDOMECHANICALS) ×2 IMPLANT
SET CHOLANGIOGRAPH MIX (MISCELLANEOUS) IMPLANT
SET IRRIG TUBING LAPAROSCOPIC (IRRIGATION / IRRIGATOR) ×2 IMPLANT
SET TUBE SMOKE EVAC HIGH FLOW (TUBING) ×2 IMPLANT
SLEEVE ADV FIXATION 5X100MM (TROCAR) ×4 IMPLANT
STAPLE ECHEON FLEX 60 POW ENDO (STAPLE) ×2 IMPLANT
STAPLER RELOAD BLUE 60MM (STAPLE) ×2
SUT MNCRL AB 4-0 PS2 18 (SUTURE) ×2 IMPLANT
SYR 10ML ECCENTRIC (SYRINGE) ×2 IMPLANT
TOWEL OR 17X26 10 PK STRL BLUE (TOWEL DISPOSABLE) ×2 IMPLANT
TOWEL OR NON WOVEN STRL DISP B (DISPOSABLE) IMPLANT
TRAY LAPAROSCOPIC (CUSTOM PROCEDURE TRAY) ×2 IMPLANT
TROCAR ADV FIXATION 12X100MM (TROCAR) ×2 IMPLANT
TROCAR ADV FIXATION 5X100MM (TROCAR) ×2 IMPLANT
TROCAR XCEL BLUNT TIP 100MML (ENDOMECHANICALS) ×2 IMPLANT
TROCAR XCEL NON-BLD 11X100MML (ENDOMECHANICALS) IMPLANT

## 2021-04-24 NOTE — Progress Notes (Signed)
     Progress Note    ASSESSMENT AND PLAN:   Biliary pancreatitis with choledocholithiasis s/p ERCP with stone extraction 8/19 followed by lap chole today Eso dysphagia d/t Schatzki's ring s/p dil 8/19  Plan: -Continue PPIs.  Can use omeprazole 20 mg once a day at D/C -Chew foods especially meats and breads well and eat slowly. -Trend LFTs -Will sign off for now -D/C per surgery.     SUBJECTIVE   Back from lap chole Mild abdominal discomfort-expect Tolerating clear liquids Pleased with the progress. Had diarrhea-better with Imodium.    OBJECTIVE:     Vital signs in last 24 hours: Temp:  [96.7 F (35.9 C)-97.9 F (36.6 C)] 97.4 F (36.3 C) (08/20 1024) Pulse Rate:  [71-96] 72 (08/20 1024) Resp:  [13-18] 16 (08/20 1024) BP: (120-165)/(56-107) 133/56 (08/20 1024) SpO2:  [85 %-100 %] 99 % (08/20 1024) Weight:  [70.8 kg] 70.8 kg (08/20 0439) Last BM Date: 04/24/21 General:   Alert, well-developed female in NAD I did not examine her   Intake/Output from previous day: 08/19 0701 - 08/20 0700 In: 1570.2 [I.V.:1520.2; IV Piggyback:50] Out: 2 [Blood:2] Intake/Output this shift: Total I/O In: 1150 [I.V.:1000; IV Piggyback:150] Out: 25 [Blood:25]  Lab Results: Recent Labs    04/22/21 0851 04/23/21 0538 04/24/21 0523  WBC 6.4 5.6 6.1  HGB 11.3* 11.1* 12.1  HCT 32.5* 32.4* 35.6*  PLT 197 201 234   BMET Recent Labs    04/22/21 0851 04/23/21 0538 04/24/21 0523  NA 133* 134* 132*  K 4.1 4.0 4.4  CL 103 105 102  CO2 24 23 21*  GLUCOSE 113* 103* 137*  BUN <5* <5* 6*  CREATININE 0.53 0.43* 0.50  CALCIUM 8.7* 8.6* 8.7*   LFT Recent Labs    04/23/21 0538  PROT 5.6*  ALBUMIN 3.3*  AST 42*  ALT 89*  ALKPHOS 85  BILITOT 1.2   PT/INR Recent Labs    04/22/21 0851  LABPROT 14.1  INR 1.1   Hepatitis Panel No results for input(s): HEPBSAG, HCVAB, HEPAIGM, HEPBIGM in the last 72 hours.  DG ERCP BILIARY & PANCREATIC DUCTS  Result Date:  04/23/2021 CLINICAL DATA:  86 year old female with a history of choledocholithiasis EXAM: ERCP TECHNIQUE: Multiple spot images obtained with the fluoroscopic device and submitted for interpretation post-procedure. FLUOROSCOPY TIME:  Fluoroscopy Time:  1 minutes 6 seconds COMPARISON:  CT 04/19/2021 FINDINGS: Limited images during ERCP. Initial image demonstrates the endoscope projecting over the upper abdomen. Subsequently there is cannulation of the ampulla with retrograde infusion of contrast. Partial opacification of the extrahepatic biliary system. Multiple geometric filling defects within the common bile duct. Subsequently there is placement of angioplasty balloon and a balloon retrieval catheter. IMPRESSION: Limited images during ERCP demonstrates treatment of choledocholithiasis with deployment of a retrieval balloon and angioplasty balloon. Please refer to the dictated operative report for full details of intraoperative findings and procedure. Electronically Signed   By: Corrie Mckusick D.O.   On: 04/23/2021 14:21     Principal Problem:   Gallstone pancreatitis Active Problems:   Hypothyroidism, postsurgical   Essential hypertension   Choledocholithiasis     LOS: 4 days     Carmell Austria, MD 04/24/2021, 12:34 PM [facility] [phone]

## 2021-04-24 NOTE — Progress Notes (Signed)
[facility]  PROGRESS NOTE  [name] [name] [id number] DOB: 1935-01-26 DOA: 04/19/2021 PCP: [name], MD   Brief HPI:   86 year old female with history of hypertension, postsurgical hypothyroidism presented to ED after starting having abdominal pain at home.  In the ED LFTs were elevated, lipase 4300.  CT abdomen pelvis showed features concerning for gallstone pancreatitis with choledocholithiasis and multiple gallstones in the distal common bile duct.  IV hydration and IV antibiotic started.  Gastroenterology was consulted and underwent EGD and ERCP with biliary sphincterotomy, sphincteroplasty and balloon extraction 8/19.  General surgery was consulted and underwent laparoscopic cholecystectomy 8/20.    Subjective   Seen on floor soon after laparoscopic cholecystectomy.  Reports nausea being much better.  Couple of hours later, RN informed that morphine is not helping patient's pain and patient is requesting oral pain meds.   Assessment/Plan:   Gallstone pancreatitis -CT scan shows choledocholithiasis multiple distal CBD stones with possible acute cholecystitis -General surgery and GI following -S/p ERCP with biliary sphincterotomy, sphincteroplasty and balloon extraction 8/19. -S/p laparoscopic cholecystectomy 8/20.  Care per general surgery. -Currently on liquid diet, to be advanced as tolerated. -Multimodal pain management. -Lipase down to 70.  AST and ALT heading towards normalcy.  Trend BMP in AM.  Hypertension -Was n.p.o. and getting as needed IV labetalol. - Now that she has a diet order, will resume metoprolol at a lower dose of 25 Mg twice daily and titrate up to home dose as needed.  Hypokalemia -Replaced  Hypomagnesemia -Magnesium was replaced  Hyponatremia -Serum sodium stable in the low 130s.  History of postoperative hypothyroidism -Switch IV to prior home dose of oral Synthroid to begin tomorrow.  Multiple incidental findings on CT abdomen  04/19/2021-outpatient follow-up as deemed necessary with PCP. These findings include colonic diverticulosis without diverticulitis, right lower lobe pulmonary nodules, largest measuring 5 mm, 13 mm left adrenal nodule, benign cyst posterior to the rectum-described on remote 2001 pelvic MRI and considered benign.     Scheduled medications:    indomethacin  100 mg Rectal Once   levothyroxine  44 mcg Intravenous Daily   pantoprazole (PROTONIX) IV  40 mg Intravenous QHS         Data Reviewed:   CBG:  Recent Labs  Lab 04/22/21 1615 04/22/21 2346 04/23/21 0745 04/23/21 1748 04/23/21 2350  GLUCAP 95 91 115* 159* 144*    SpO2: 99 % O2 Flow Rate (L/min): 2 L/min    Vitals:   04/24/21 0940 04/24/21 0945 04/24/21 1000 04/24/21 1024  BP: 126/89 125/77 139/61 (!) 133/56  Pulse: 92 86 79 72  Resp: '15 13 13 16  '$ Temp:   (!) 97.2 F (36.2 C) (!) 97.4 F (36.3 C)  TempSrc:    Oral  SpO2: 99% 100% 99% 99%  Weight:      Height:         Intake/Output Summary (Last 24 hours) at 04/24/2021 1237 Last data filed at 04/24/2021 [id number] Gross per 24 hour  Intake 2720.24 ml  Output 27 ml  Net 2693.24 ml    08/18 1901 - 08/20 0700 In: 2670.6 [I.V.:2484.2] Out: 2   Filed Weights   04/23/21 0500 04/23/21 1124 04/24/21 0439  Weight: 68.1 kg 68.1 kg 70.8 kg    CBC:  Recent Labs  Lab 04/20/21 0412 04/21/21 0525 04/22/21 0851 04/23/21 0538 04/24/21 0523  WBC 20.8* 11.9* 6.4 5.6 6.1  HGB 11.9* 10.2* 11.3* 11.1* 12.1  HCT 33.7* 29.8* 32.5* 32.4* 35.6*  PLT  204 168 197 201 234  MCV 91.6 94.3 92.6 92.3 94.4  MCH 32.3 32.3 32.2 31.6 32.1  MCHC 35.3 34.2 34.8 34.3 34.0  RDW 11.7 12.1 12.0 12.1 12.0  LYMPHSABS 0.7  --   --   --   --   MONOABS 1.0  --   --   --   --   EOSABS 0.0  --   --   --   --   BASOSABS 0.0  --   --   --   --     Complete metabolic panel:  Recent Labs  Lab 04/19/21 2120 04/20/21 0004 04/20/21 0201 04/20/21 0412 04/20/21 0600 04/20/21 0800  04/20/21 1157 04/21/21 0525 04/22/21 0851 04/23/21 0538 04/24/21 0523  NA 130*  --   --  131*  --   --   --  130* 133* 134* 132*  K 3.4*  --   --  3.8  --   --   --  3.2* 4.1 4.0 4.4  CL 92*  --   --  95*  --   --   --  98 103 105 102  CO2 26  --   --  26  --   --   --  '25 24 23 '$ 21*  GLUCOSE 148*  --   --  149*  --   --   --  93 113* 103* 137*  BUN 10  --   --  9  --   --   --  11 <5* <5* 6*  CREATININE 0.55  --   --  0.59  --   --   --  0.49 0.53 0.43* 0.50  CALCIUM 9.4  --   --  8.7*  --   --   --  8.0* 8.7* 8.6* 8.7*  AST 592*  --   --  606*  --   --   --  178* 78* 42*  --   ALT 231*  --   --  372*  --   --   --  183* 127* 89*  --   ALKPHOS 113  --   --  104  --   --   --  85 96 85  --   BILITOT 1.6*  --   --  2.6*  --   --   --  3.0* 1.3* 1.2  --   ALBUMIN 4.6  --   --  3.4*  --   --   --  2.8* 3.4* 3.3*  --   MG  --   --   --   --   --   --   --  1.7  --   --   --   LATICACIDVEN  --  4.3* 4.0*  --  3.9* 4.0* 1.5  --   --   --   --   INR  --   --   --   --   --   --   --   --  1.1  --   --     Recent Labs  Lab 04/19/21 2120 04/20/21 0412 04/21/21 0525 04/22/21 0851 04/23/21 0538  LIPASE 4,307* 1,041* 268* 99* 70*    Recent Labs  Lab 04/20/21 0004  SARSCOV2NAA NEGATIVE    Lab Results  Component Value Date   HGBA1C 5.7 10/02/2006   Coagulation profile Recent Labs  Lab 04/22/21 0851  INR 1.1   Antibiotics: Anti-infectives (From admission, onward)  Start     Dose/Rate Route Frequency Ordered Stop   04/24/21 0730  ceFAZolin (ANCEF) 2-4 GM/100ML-% IVPB       Note to Pharmacy: Herbie Drape   : cabinet override      04/24/21 0730 04/24/21 1944   04/23/21 0600  ciprofloxacin (CIPRO) IVPB 400 mg  Status:  Discontinued        400 mg 200 mL/hr over 60 Minutes Intravenous On call to O.R. 04/22/21 0914 04/22/21 1516   04/23/21 0600  ciprofloxacin (CIPRO) IVPB 400 mg        400 mg 200 mL/hr over 60 Minutes Intravenous On call to O.R. 04/22/21 1516 04/24/21 0559    04/20/21 0800  piperacillin-tazobactam (ZOSYN) IVPB 3.375 g  Status:  Discontinued        3.375 g 12.5 mL/hr over 240 Minutes Intravenous Every 8 hours 04/20/21 0235 04/23/21 1637   04/20/21 0015  piperacillin-tazobactam (ZOSYN) IVPB 3.375 g        3.375 g 100 mL/hr over 30 Minutes Intravenous  Once 04/20/21 0012 04/20/21 0054        Radiology Reports  DG ERCP BILIARY & PANCREATIC DUCTS  Result Date: 04/23/2021 CLINICAL DATA:  86 year old female with a history of choledocholithiasis EXAM: ERCP TECHNIQUE: Multiple spot images obtained with the fluoroscopic device and submitted for interpretation post-procedure. FLUOROSCOPY TIME:  Fluoroscopy Time:  1 minutes 6 seconds COMPARISON:  CT 04/19/2021 FINDINGS: Limited images during ERCP. Initial image demonstrates the endoscope projecting over the upper abdomen. Subsequently there is cannulation of the ampulla with retrograde infusion of contrast. Partial opacification of the extrahepatic biliary system. Multiple geometric filling defects within the common bile duct. Subsequently there is placement of angioplasty balloon and a balloon retrieval catheter. IMPRESSION: Limited images during ERCP demonstrates treatment of choledocholithiasis with deployment of a retrieval balloon and angioplasty balloon. Please refer to the dictated operative report for full details of intraoperative findings and procedure. Electronically Signed   By: Corrie Mckusick D.O.   On: 04/23/2021 14:21      DVT prophylaxis: SCDs  Code Status: Full code  Family Communication: No family at bedside   Consultants: [place] [facility]  Procedures: EGD 04/23/2021:   Impression:        - Schatzki's ring s/p esophageal dilatation                           - Small hiatal hernia.                           - Normal examined duodenum.                           - No specimens collected.  ERCP 04/23/2021:  Impression:       - Choledocholithiasis s/p biliary  sphincterotomy,                            sphincteroplasty and balloon extraction.                           - Periampullary diverticulum                           - Cholelithiasis.  Laparoscopic cholecystectomy 04/24/2021.  Objective    Physical Examination:  General  exam: Elderly female, moderately built and frail lying comfortably supine in bed.  Patient's female RN by bedside. Respiratory system: Clear to auscultation. Respiratory effort normal. Cardiovascular system: S1 & S2 heard, RRR. No JVD, murmurs, rubs, gallops or clicks. No pedal edema.  Telemetry personally reviewed: Sinus rhythm. Gastrointestinal system: Abdomen is nondistended, soft and minimal appropriate tenderness from recent laparoscopic cholecystectomy.  Incision sites well approximated and without acute findings.  No organomegaly or masses felt. Normal bowel sounds heard. Central nervous system: Alert and oriented. No focal neurological deficits. Extremities: Symmetric 5 x 5 power. Skin: No rashes, lesions or ulcers Psychiatry: Judgement and insight appear normal. Mood & affect appropriate.     Status is: Inpatient  Dispo: The patient is from: Home              Anticipated d/c is to: Home              Anticipated d/c date is: 8/21 or 8/22.              Patient currently not stable for discharge  Barrier to discharge-recovery from recent cholecystectomy.  COVID-19 Labs  No results for input(s): DDIMER, FERRITIN, LDH, CRP in the last 72 hours.  Lab Results  Component Value Date   [place] NEGATIVE 04/20/2021    Microbiology  Recent Results (from the past 240 hour(s))  Resp Panel by RT-PCR (Flu A&B, Covid) Nasopharyngeal Swab     Status: None   Collection Time: 04/20/21 12:04 AM   Specimen: Nasopharyngeal Swab; Nasopharyngeal(NP) swabs in vial transport medium  Result Value Ref Range Status   SARS Coronavirus 2 by RT PCR NEGATIVE NEGATIVE Final    Comment: (NOTE) SARS-CoV-2 target nucleic acids  are NOT DETECTED.  The SARS-CoV-2 RNA is generally detectable in upper respiratory specimens during the acute phase of infection. The lowest concentration of SARS-CoV-2 viral copies this assay can detect is 138 copies/mL. A negative result does not preclude SARS-Cov-2 infection and should not be used as the sole basis for treatment or other patient management decisions. A negative result may occur with  improper specimen collection/handling, submission of specimen other than nasopharyngeal swab, presence of viral mutation(s) within the areas targeted by this assay, and inadequate number of viral copies(<138 copies/mL). A negative result must be combined with clinical observations, patient history, and epidemiological information. The expected result is Negative.  Fact Sheet for Patients:  EntrepreneurPulse.com.au  Fact Sheet for Healthcare Providers:  IncredibleEmployment.be  This test is no t yet approved or cleared by the [place] FDA and  has been authorized for detection and/or diagnosis of SARS-CoV-2 by FDA under an Emergency Use Authorization (EUA). This EUA will remain  in effect (meaning this test can be used) for the duration of the COVID-19 declaration under Section 564(b)(1) of the Act, 21 U.S.C.section 360bbb-3(b)(1), unless the authorization is terminated  or revoked sooner.       Influenza A by PCR NEGATIVE NEGATIVE Final   Influenza B by PCR NEGATIVE NEGATIVE Final    Comment: (NOTE) The Xpert Xpress SARS-CoV-2/FLU/RSV plus assay is intended as an aid in the diagnosis of influenza from Nasopharyngeal swab specimens and should not be used as a sole basis for treatment. Nasal washings and aspirates are unacceptable for Xpert Xpress SARS-CoV-2/FLU/RSV testing.  Fact Sheet for Patients: EntrepreneurPulse.com.au  Fact Sheet for Healthcare Providers: IncredibleEmployment.be  This test is  not yet approved or cleared by the [place] FDA and has been authorized for detection and/or diagnosis of SARS-CoV-2  by FDA under an Emergency Use Authorization (EUA). This EUA will remain in effect (meaning this test can be used) for the duration of the COVID-19 declaration under Section 564(b)(1) of the Act, 21 U.S.C. section 360bbb-3(b)(1), unless the authorization is terminated or revoked.  Performed at [facility], [place] [address], [place], [place] [address]            Vernell Leep, MD, [place], [facility]. [facility]  To contact the attending provider between 7A-7P or the covering provider during after hours 7P-7A, please log into the web site [email or web address] and access using universal [place] password for that web site. If you do not have the password, please call the hospital operator.    04/24/2021, 12:37 PM  LOS: 4 days

## 2021-04-24 NOTE — Op Note (Signed)
04/24/2021 9:27 AM  PATIENT: [name] [name]  86 y.o. female  Patient Care Team: [name], MD as PCP - General (Family Medicine)  PRE-OPERATIVE DIAGNOSIS: Biliary Pancreatitis   POST-OPERATIVE DIAGNOSIS: Same  PROCEDURE: Laparoscopic cholecystectomy  SURGEON: Sharon [place] Maiolo, MD  ASSISTANT: Autumn Messing, MD  ANESTHESIA: General endotracheal  EBL: Total I/O In: [id number] [I.V.:1000; IV Piggyback:150] Out: 25 [Blood:25]  DRAINS: None  SPECIMEN: Gallbladder with gallstones  COUNTS: Sponge, needle and instrument counts were reported correct x2 at the conclusion of the operation  DISPOSITION: PACU in satisfactory condition  COMPLICATIONS: None  FINDINGS: Midline adhesions related to her prior laparotomy primarily of omentum.  There is a area free of adhesions in the central upper abdomen.  All of her other adhesions are within the abdominal midline.  There is some adhesion containing transverse colon just above her umbilicus. She was found to have a large, dilated, thin walled common bile duct nearly abutting the infundibulum of her gallbladder and an enlarged cystic duct consistent with the process that she is recently experienced with biliary pancreatitis and large stones passing.  The cystic artery was able to be traced high up onto her gallbladder.  We ultimately elected to take the gallbladder at the level of the infundibulum to prevent any potential injuries to the common bile duct or dissection around the common bile duct.  DESCRIPTION:  The patient was identified & brought into the operating room. She was then positioned supine on the OR table. SCDs were in place and active during the entire case. She then underwent general endotracheal anesthesia. Pressure points were padded. Hair on the abdomen was clipped by the OR team. The abdomen was prepped and draped in the standard sterile fashion. Antibiotics were administered. A surgical timeout was performed and confirmed our  plan.   An OG tube was placed by anesthesia and confirmed to be to suction.  Given her surgical history, a Veress needle entry was selected.  At Palmer's point, a stab incision was created and the Veress needle introduced into the peritoneal cavity on the first attempt.  Pneumoperitoneum was then established to maximum pressure 15 mmHg using CO2.  In the right upper quadrant, midclavicular line, a 5 mm optical viewing trocar was cautiously placed under direct visualization.  The laparoscope was inserted into the peritoneal cavity confirmed no evidence of trocar site or Veress needle site complication.  2 additional 5 mm trochars were placed in the right upper quadrant, 1 closer to the midline and 1 in the right lateral abdominal wall.  The midline was inspected.  She has adhesions related to her prior surgery primarily containing omentum as well as a loop of transverse colon.  The space in the upper midline abdomen is clear of adhesions.  We were able to place a 5 mm trocar to the right side of her omental containing adhesions.  This was the level of the umbilicus.  The 5 mm camera was then moved to this trocar.  The gallbladder was inspected.  It was elevated.  Adhesions of omentum were taken down from it.    The liver and gallbladder were inspected.  There is mild injection of the pericholecystic peritoneum consistent with her recent history of biliary pancreatitis.  The liver and gallbladder are otherwise normal. The gallbladder fundus was grasped and elevated cephalad. An additional grasper was then placed on the infundibulum of the gallbladder and the infundibulum was retracted laterally.  She has an apparent large tubular structure abutting her  infundibulum that appears consistent with a dilated common bile duct.  This is relatively thin-walled.  Staying high on the gallbladder, the peritoneum on both sides of the gallbladder was opened with hook cautery. Gentle blunt dissection was then employed with a  IT consultant working down into [employer]. Calot's lymph node was identified at this level. The cystic artery is seen and wraps around the infundibulum and over the posterior side. This is traced high up onto the fundus.  She has a very large dilated cystic duct consistent with her ERCP findings.  We reviewed the images from her ERCP intraoperatively as well to confirm concordance.  The cystic duct is relatively intimately associated with her common bile duct.  Her common bile duct is dilated and thin-walled.  Given these findings, we elected not to dissect around the structure out of concern for potential, bile duct injury.  Therefore, the gallbladder was mobilized starting at the upper midportion from both sides where the peritoneum had been opened.  This was taken out to the fundus of the gallbladder.  Essentially a top-down type approach was then employed and gentle blunt dissection commenced to free the gallbladder from the liver bed.  We were able to get down onto her infundibulum.  We elected not to dissect any further for the reasons noted above.  The decision was made to proceed with essentially a subtotal type cholecystectomy leaving a small remnant of infundibulum behind.  The cystic artery was clipped at both its anterior and posterior divisions high up on the gallbladder infundibulum.  This was done with 5 mm titanium clips, with at least 2 clips remaining on the patient side.  The clips were inspected and noted to be in good position and hemostatic.  A powered Echelon blue load stapler was used after upsizing the right upper midline trocar to a 12 mm port to divide the gallbladder infundibulum.  The gallbladder was divided with a single firing of the stapler.  The stump was inspected and noted to be with an intact appearing staple line.  This again is well away from her common bile duct.  A cholangiogram was not able to be performed at this point for the findings noted above.  Additionally, her duct was cleared with ERCP yesterday.  The gallbladder was placed into an endocatch bag. The RUQ was gently irrigated with sterile saline. Hemostasis was then verified. The clips were in good position; the gallbladder fossa was dry. The rest of the abdomen was inspected no injury nor bleeding elsewhere was identified.  The endocatch bag containing the gallbladder was then removed from the 12 mm port site and passed off as specimen.  The site was then closed using a figure-of-eight 0 Vicryl stitch.  The fascial closure was then palpated under pneumoperitoneum and noted to be completely closed.  The 5 mm  ports were removed under direct visualization and noted to be hemostatic.  Port sites were infiltrated with 0.25% Marcaine with epinephrine.  The skin of all incision sites was approximated with 4-0 monocryl subcuticular suture and dermabond applied. She was then awakened from anesthesia, extubated, and transferred to a stretcher for transport to PACU in satisfactory condition.

## 2021-04-24 NOTE — Progress Notes (Signed)
Subjective No acute events. Feeling well. No abdominal pain. Pancreatitis pains have all resolved. No n/v.  Objective: Vital signs in last 24 hours: Temp:  [96.4 F (35.8 C)-97.9 F (36.6 C)] 97.9 F (36.6 C) (08/20 0442) Pulse Rate:  [71-96] 71 (08/20 0442) Resp:  [13-18] 16 (08/20 0442) BP: (120-171)/(60-84) 120/60 (08/20 0442) SpO2:  [93 %-100 %] 96 % (08/20 0442) Weight:  [68.1 kg-70.8 kg] 70.8 kg (08/20 0439) Last BM Date: 04/21/21  Intake/Output from previous day: 08/19 0701 - 08/20 0700 In: 1570.2 [I.V.:1520.2; IV Piggyback:50] Out: 2 [Blood:2] Intake/Output this shift: No intake/output data recorded.  Gen: NAD, comfortable CV: RRR Pulm: Normal work of breathing Abd: Soft, NT/ND Ext: SCDs in place  Lab Results: CBC  Recent Labs    04/23/21 0538 04/24/21 0523  WBC 5.6 6.1  HGB 11.1* 12.1  HCT 32.4* 35.6*  PLT 201 234   BMET Recent Labs    04/23/21 0538 04/24/21 0523  NA 134* 132*  K 4.0 4.4  CL 105 102  CO2 23 21*  GLUCOSE 103* 137*  BUN <5* 6*  CREATININE 0.43* 0.50  CALCIUM 8.6* 8.7*   PT/INR Recent Labs    04/22/21 0851  LABPROT 14.1  INR 1.1   ABG No results for input(s): PHART, HCO3 in the last 72 hours.  Invalid input(s): PCO2, PO2  Studies/Results:  Anti-infectives: Anti-infectives (From admission, onward)    Start     Dose/Rate Route Frequency Ordered Stop   04/23/21 0600  ciprofloxacin (CIPRO) IVPB 400 mg  Status:  Discontinued        400 mg 200 mL/hr over 60 Minutes Intravenous On call to O.R. 04/22/21 0914 04/22/21 1516   04/23/21 0600  ciprofloxacin (CIPRO) IVPB 400 mg        400 mg 200 mL/hr over 60 Minutes Intravenous On call to O.R. 04/22/21 1516 04/24/21 0559   04/20/21 0800  piperacillin-tazobactam (ZOSYN) IVPB 3.375 g  Status:  Discontinued        3.375 g 12.5 mL/hr over 240 Minutes Intravenous Every 8 hours 04/20/21 0235 04/23/21 1637   04/20/21 0015  piperacillin-tazobactam (ZOSYN) IVPB 3.375 g        3.375  g 100 mL/hr over 30 Minutes Intravenous  Once 04/20/21 0012 04/20/21 0054        Assessment/Plan: Patient Active Problem List   Diagnosis Date Noted   Choledocholithiasis 04/20/2021   Gallstone pancreatitis 04/20/2021   DERMATITIS, SEBORRHEIC 07/14/2010   Squamous cell skin cancer 07/14/2010   Polymyalgia rheumatica ([place]) 06/23/2010   POSTMENOPAUSAL SYNDROME 06/03/2010   ARTHRALGIA 06/03/2010   THYROID CANCER, HX OF 02/06/2009   Hypothyroidism, postsurgical 06/16/2008   DIVERTICULOSIS, COLON 06/16/2008   HYPERLIPIDEMIA 06/29/2007   Essential hypertension 01/05/2007   IBS 01/05/2007   FIBROCYSTIC BREAST DISEASE 01/05/2007  Gallstone pancreatitis/choledocholithiasis  -The anatomy and physiology of the hepatobiliary system was discussed with the patient. The pathophysiology of gallbladder disease was discussed at length as well -The options for treatment were discussed including ongoing observation which may result in subsequent gallbladder complications (infection, recurrent pancreatitis, choledocholithiasis, etc) and surgery - laparoscopic cholecystectomy -The planned procedure, material risks (including, but not limited to, pain, bleeding, infection, scarring, conversion to an open procedure, need for blood transfusion, damage to surrounding structures- blood vessels/nerves/viscus/organs, damage to bile duct, bile leak, need for additional procedures, hernia, worsening of pre-existing medical conditions, pancreatitis, pneumonia, heart attack, stroke, death) benefits and alternatives to surgery were discussed at length. I noted a good probability that the  procedure would help improve their symptoms. The patient's questions were answered to their satisfaction, they voiced understanding and they elected to proceed with surgery. Additionally, we discussed typical postoperative expectations and the recovery process.    LOS: 4 days   Nadeen Landau, MD [facility] Use [email or web address] to contact on call provider

## 2021-04-24 NOTE — Transfer of Care (Signed)
Immediate Anesthesia Transfer of Care Note  Patient: [name] [name]  Procedure(s) Performed: LAPAROSCOPIC CHOLECYSTECTOMY (Abdomen)  Patient Location: PACU  Anesthesia Type:General  Level of Consciousness: awake, alert  and oriented  Airway & Oxygen Therapy: Patient Spontanous Breathing and Patient connected to face mask oxygen  Post-op Assessment: Report given to RN and Post -op Vital signs reviewed and stable  Post vital signs: Reviewed and stable  Last Vitals:  Vitals Value Taken Time  BP 155/107 04/24/21 0935  Temp    Pulse 96 04/24/21 0938  Resp 17 04/24/21 0938  SpO2 100 % 04/24/21 0938  Vitals shown include unvalidated device data.  Last Pain:  Vitals:   04/24/21 0442  TempSrc: Oral  PainSc:       Patients Stated Pain Goal: 0 ([id number])  Complications: No notable events documented.

## 2021-04-24 NOTE — Anesthesia Procedure Notes (Signed)
Procedure Name: Intubation Date/Time: 04/24/2021 7:59 AM Performed by: Gean Maidens, CRNA Pre-anesthesia Checklist: Patient identified, Emergency Drugs available, Suction available, Patient being monitored and Timeout performed Patient Re-evaluated:Patient Re-evaluated prior to induction Oxygen Delivery Method: Circle system utilized Preoxygenation: Pre-oxygenation with 100% oxygen Induction Type: IV induction Ventilation: Mask ventilation without difficulty Laryngoscope Size: Mac and 3 Grade View: Grade I Tube type: Oral Tube size: 7.0 mm Number of attempts: 1 Airway Equipment and Method: Stylet Placement Confirmation: ETT inserted through vocal cords under direct vision, positive ETCO2 and breath sounds checked- equal and bilateral Secured at: 21 cm Tube secured with: Tape Dental Injury: Teeth and Oropharynx as per pre-operative assessment

## 2021-04-24 NOTE — Anesthesia Postprocedure Evaluation (Signed)
Anesthesia Post Note  Patient: [name] [name]  Procedure(s) Performed: LAPAROSCOPIC CHOLECYSTECTOMY (Abdomen)     Patient location during evaluation: PACU Anesthesia Type: General Level of consciousness: awake and alert Pain management: pain level controlled Vital Signs Assessment: post-procedure vital signs reviewed and stable Respiratory status: spontaneous breathing, nonlabored ventilation, respiratory function stable and patient connected to nasal cannula oxygen Cardiovascular status: blood pressure returned to baseline and stable Postop Assessment: no apparent nausea or vomiting Anesthetic complications: no   No notable events documented.  Last Vitals:  Vitals:   04/24/21 0945 04/24/21 1000  BP: 125/77 139/61  Pulse: 86 79  Resp: 13 13  Temp:  (!) 36.2 C  SpO2: 100% 99%    Last Pain:  Vitals:   04/24/21 1000  TempSrc:   PainSc: 0-No pain                 Ashford Clouse,W. EDMOND

## 2021-04-24 NOTE — Anesthesia Preprocedure Evaluation (Addendum)
Anesthesia Evaluation  Patient identified by MRN, date of birth, ID band Patient awake    Reviewed: Allergy & Precautions, H&P , NPO status , Patient's Chart, lab work & pertinent test results, reviewed documented beta blocker date and time   History of Anesthesia Complications (+) PONV  Airway Mallampati: III  TM Distance: >3 FB Neck ROM: Full    Dental no notable dental hx. (+) Dental Advisory Given, Chipped   Pulmonary neg pulmonary ROS,    Pulmonary exam normal breath sounds clear to auscultation       Cardiovascular hypertension, Pt. on medications and Pt. on home beta blockers  Rhythm:Regular Rate:Normal     Neuro/Psych negative neurological ROS  negative psych ROS   GI/Hepatic negative GI ROS, Neg liver ROS,   Endo/Other  Hypothyroidism   Renal/GU negative Renal ROS  negative genitourinary   Musculoskeletal   Abdominal   Peds  Hematology negative hematology ROS (+)   Anesthesia Other Findings   Reproductive/Obstetrics negative OB ROS                            Anesthesia Physical Anesthesia Plan  ASA: 2  Anesthesia Plan: General   Post-op Pain Management:    Induction: Intravenous  PONV Risk Score and Plan: 4 or greater and Ondansetron, Dexamethasone and Treatment may vary due to age or medical condition  Airway Management Planned: Oral ETT  Additional Equipment:   Intra-op Plan:   Post-operative Plan: Extubation in OR  Informed Consent: I have reviewed the patients History and Physical, chart, labs and discussed the procedure including the risks, benefits and alternatives for the proposed anesthesia with the patient or authorized representative who has indicated his/her understanding and acceptance.     Dental advisory given  Plan Discussed with: CRNA  Anesthesia Plan Comments:         Anesthesia Quick Evaluation

## 2021-04-25 ENCOUNTER — Encounter (HOSPITAL_COMMUNITY): Payer: Self-pay | Admitting: Surgery

## 2021-04-25 DIAGNOSIS — I10 Essential (primary) hypertension: Secondary | ICD-10-CM | POA: Diagnosis not present

## 2021-04-25 DIAGNOSIS — E890 Postprocedural hypothyroidism: Secondary | ICD-10-CM | POA: Diagnosis not present

## 2021-04-25 DIAGNOSIS — K851 Biliary acute pancreatitis without necrosis or infection: Secondary | ICD-10-CM | POA: Diagnosis not present

## 2021-04-25 LAB — CBC WITH DIFFERENTIAL/PLATELET
Abs Immature Granulocytes: 0.12 10*3/uL — ABNORMAL HIGH (ref 0.00–0.07)
Basophils Absolute: 0 10*3/uL (ref 0.0–0.1)
Basophils Relative: 0 %
Eosinophils Absolute: 0 10*3/uL (ref 0.0–0.5)
Eosinophils Relative: 0 %
HCT: 32.2 % — ABNORMAL LOW (ref 36.0–46.0)
Hemoglobin: 10.7 g/dL — ABNORMAL LOW (ref 12.0–15.0)
Immature Granulocytes: 1 %
Lymphocytes Relative: 9 %
Lymphs Abs: 1.4 10*3/uL (ref 0.7–4.0)
MCH: 31.6 pg (ref 26.0–34.0)
MCHC: 33.2 g/dL (ref 30.0–36.0)
MCV: 95 fL (ref 80.0–100.0)
Monocytes Absolute: 0.5 10*3/uL (ref 0.1–1.0)
Monocytes Relative: 3 %
Neutro Abs: 12.8 10*3/uL — ABNORMAL HIGH (ref 1.7–7.7)
Neutrophils Relative %: 87 %
Platelets: 219 10*3/uL (ref 150–400)
RBC: 3.39 MIL/uL — ABNORMAL LOW (ref 3.87–5.11)
RDW: 12.3 % (ref 11.5–15.5)
WBC: 14.8 10*3/uL — ABNORMAL HIGH (ref 4.0–10.5)
nRBC: 0 % (ref 0.0–0.2)

## 2021-04-25 LAB — COMPREHENSIVE METABOLIC PANEL
ALT: 51 U/L — ABNORMAL HIGH (ref 0–44)
AST: 23 U/L (ref 15–41)
Albumin: 3.1 g/dL — ABNORMAL LOW (ref 3.5–5.0)
Alkaline Phosphatase: 66 U/L (ref 38–126)
Anion gap: 7 (ref 5–15)
BUN: 7 mg/dL — ABNORMAL LOW (ref 8–23)
CO2: 22 mmol/L (ref 22–32)
Calcium: 8.2 mg/dL — ABNORMAL LOW (ref 8.9–10.3)
Chloride: 101 mmol/L (ref 98–111)
Creatinine, Ser: 0.46 mg/dL (ref 0.44–1.00)
GFR, Estimated: >60 mL/min (ref >60)
Glucose, Bld: 134 mg/dL — ABNORMAL HIGH (ref 70–99)
Potassium: 4.4 mmol/L (ref 3.5–5.1)
Sodium: 130 mmol/L — ABNORMAL LOW (ref 135–145)
Total Bilirubin: 0.8 mg/dL (ref 0.3–1.2)
Total Protein: 5.6 g/dL — ABNORMAL LOW (ref 6.5–8.1)

## 2021-04-25 LAB — GLUCOSE, CAPILLARY
Glucose-Capillary: 119 mg/dL — ABNORMAL HIGH (ref 70–99)
Glucose-Capillary: 145 mg/dL — ABNORMAL HIGH (ref 70–99)
Glucose-Capillary: 220 mg/dL — ABNORMAL HIGH (ref 70–99)

## 2021-04-25 LAB — LIPASE, BLOOD: Lipase: 33 U/L (ref 11–51)

## 2021-04-25 MED ORDER — HYDROCODONE-ACETAMINOPHEN 5-325 MG PO TABS: 1.0000 | ORAL_TABLET | Freq: Four times a day (QID) | ORAL | 0 refills | Status: DC | PRN

## 2021-04-25 NOTE — Anesthesia Postprocedure Evaluation (Signed)
Anesthesia Post Note  Patient: [name] [name]  Procedure(s) Performed: ENDOSCOPIC RETROGRADE CHOLANGIOPANCREATOGRAPHY (ERCP) SPHINCTEROTOMY BILIARY DILATION REMOVAL OF STONES ESOPHAGOGASTRODUODENOSCOPY (EGD) WITH PROPOFOL ESOPHAGEAL DILITATION     Patient location during evaluation: Endoscopy Anesthesia Type: General Level of consciousness: awake and alert Pain management: pain level controlled Vital Signs Assessment: post-procedure vital signs reviewed and stable Respiratory status: spontaneous breathing, nonlabored ventilation, respiratory function stable and patient connected to nasal cannula oxygen Cardiovascular status: blood pressure returned to baseline and stable Postop Assessment: no apparent nausea or vomiting Anesthetic complications: no   No notable events documented.  Last Vitals:  Vitals:   04/25/21 1022 04/25/21 1402  BP: 133/66 126/62  Pulse: 78 (!) 58  Resp: 17 16  Temp: 37.1 C 37.2 C  SpO2: 99% 100%    Last Pain:  Vitals:   04/25/21 1022  TempSrc: Oral  PainSc:                  [name] [name]

## 2021-04-25 NOTE — Progress Notes (Signed)
1 Day Post-Op   Subjective/Chief Complaint: No complaints other than soreness   Objective: Vital signs in last 24 hours: Temp:  [96.7 F (35.9 C)-98 F (36.7 C)] 97.5 F (36.4 C) (08/21 0452) Pulse Rate:  [70-92] 70 (08/21 0452) Resp:  [13-20] 16 (08/21 0452) BP: (106-155)/(48-107) 106/48 (08/21 0452) SpO2:  [85 %-100 %] 92 % (08/21 0452) Weight:  [70.3 kg] 70.3 kg (08/21 0500) Last BM Date: 04/24/21  Intake/Output from previous day: 08/20 0701 - 08/21 0700 In: 2289.6 [P.O.:472; I.V.:1667.6; IV Piggyback:150] Out: 325 [Urine:300; Blood:25] Intake/Output this shift: No intake/output data recorded.  General appearance: alert and cooperative Resp: clear to auscultation bilaterally Cardio: regular rate and rhythm GI: soft, appropriately tender  Lab Results:  Recent Labs    04/24/21 0523 04/25/21 0436  WBC 6.1 14.8*  HGB 12.1 10.7*  HCT 35.6* 32.2*  PLT 234 219   BMET Recent Labs    04/24/21 0523 04/25/21 0436  NA 132* 130*  K 4.4 4.4  CL 102 101  CO2 21* 22  GLUCOSE 137* 134*  BUN 6* 7*  CREATININE 0.50 0.46  CALCIUM 8.7* 8.2*   PT/INR Recent Labs    04/22/21 0851  LABPROT 14.1  INR 1.1   ABG No results for input(s): PHART, HCO3 in the last 72 hours.  Invalid input(s): PCO2, PO2  Studies/Results: DG ERCP BILIARY & PANCREATIC DUCTS  Result Date: 04/23/2021 CLINICAL DATA:  86 year old female with a history of choledocholithiasis EXAM: ERCP TECHNIQUE: Multiple spot images obtained with the fluoroscopic device and submitted for interpretation post-procedure. FLUOROSCOPY TIME:  Fluoroscopy Time:  1 minutes 6 seconds COMPARISON:  CT 04/19/2021 FINDINGS: Limited images during ERCP. Initial image demonstrates the endoscope projecting over the upper abdomen. Subsequently there is cannulation of the ampulla with retrograde infusion of contrast. Partial opacification of the extrahepatic biliary system. Multiple geometric filling defects within the common bile  duct. Subsequently there is placement of angioplasty balloon and a balloon retrieval catheter. IMPRESSION: Limited images during ERCP demonstrates treatment of choledocholithiasis with deployment of a retrieval balloon and angioplasty balloon. Please refer to the dictated operative report for full details of intraoperative findings and procedure. Electronically Signed   By: Corrie Mckusick D.O.   On: 04/23/2021 14:21    Anti-infectives: Anti-infectives (From admission, onward)    Start     Dose/Rate Route Frequency Ordered Stop   04/24/21 0730  ceFAZolin (ANCEF) 2-4 GM/100ML-% IVPB       Note to Pharmacy: Herbie Drape   : cabinet override      04/24/21 0730 04/24/21 1944   04/23/21 0600  ciprofloxacin (CIPRO) IVPB 400 mg  Status:  Discontinued        400 mg 200 mL/hr over 60 Minutes Intravenous On call to O.R. 04/22/21 0914 04/22/21 1516   04/23/21 0600  ciprofloxacin (CIPRO) IVPB 400 mg        400 mg 200 mL/hr over 60 Minutes Intravenous On call to O.R. 04/22/21 1516 04/24/21 0559   04/20/21 0800  piperacillin-tazobactam (ZOSYN) IVPB 3.375 g  Status:  Discontinued        3.375 g 12.5 mL/hr over 240 Minutes Intravenous Every 8 hours 04/20/21 0235 04/23/21 1637   04/20/21 0015  piperacillin-tazobactam (ZOSYN) IVPB 3.375 g        3.375 g 100 mL/hr over 30 Minutes Intravenous  Once 04/20/21 0012 04/20/21 0054       Assessment/Plan: s/p Procedure(s) with comments: LAPAROSCOPIC CHOLECYSTECTOMY (N/A) - 90 Advance diet Discharge  LOS: 5  days    Autumn Messing III 04/25/2021

## 2021-04-25 NOTE — Discharge Summary (Signed)
Physician Discharge Summary  [name] [name] [id number] DOB: Feb 01, 1935 DOA: 04/19/2021  PCP: [name], MD  Admit date: 04/19/2021 Discharge date: 04/25/2021  Time spent: 50 minutes  Recommendations for Outpatient Follow-up:  Follow-up general surgery as outpatient in 2 weeks   Discharge Diagnoses:  Principal Problem:   Gallstone pancreatitis Active Problems:   Hypothyroidism, postsurgical   Essential hypertension   Choledocholithiasis   Discharge Condition: Stable  Diet recommendation: Heart healthy diet  Filed Weights   04/23/21 1124 04/24/21 0439 04/25/21 0500  Weight: 68.1 kg 70.8 kg 70.3 kg    History of present illness:  86 year old female with history of hypertension, postsurgical hypothyroidism presented to ED after starting having abdominal pain at home.  In the ED LFTs were elevated, lipase 4300.  CT abdomen pelvis showed features concerning for gallstone pancreatitis with choledocholithiasis and multiple gallstones in the distal common bile duct.  IV hydration and IV antibiotic started.  Gastroenterology was consulted.  Hospital Course:   Gallstone pancreatitis -CT scan shows choledocholithiasis multiple distal CBD stones with possible acute cholecystitis -Patient underwent ERCP with biliary sphincterotomy, sphincteroplasty and balloon extraction on 8/19 -S/p laparoscopic cholecystectomy on 8/20 -General surgery has cleared for discharge.  Patient to be discharged home today. -Take Vicodin as needed for pain -Follow-up general surgery as outpatient  Hypertension -Blood pressure is stable -Continue home medications   Hypokalemia -Replete   Hypomagnesemia -Magnesium was replaced   Hyponatremia -Sodium has improved to 134   History of postoperative hypothyroidism -Continue home medication   CT scan also showed cyst in the rectal area -Benign cyst, described on remote 2001 pelvic MRI -Follow-up monitoring as outpatient  -Multiple  incidental findings on CT abdomen 04/19/2021-outpatient follow-up as deemed necessary with PCP. These findings include colonic diverticulosis without diverticulitis, right lower lobe pulmonary nodules, largest measuring 5 mm, 13 mm left adrenal nodule, benign cyst posterior to the rectum-described on remote 2001 pelvic MRI and considered benign.    Procedures: ERCP Laparoscopic cholecystectomy  Consultations: General surgery Gastroenterology  Discharge Exam: Vitals:   04/25/21 0452 04/25/21 1022  BP: (!) 106/48 133/66  Pulse: 70 78  Resp: 16 17  Temp: (!) 97.5 F (36.4 C) 98.8 F (37.1 C)  SpO2: 92% 99%    General: Appears in no acute distress Cardiovascular: S1-S2, regular Respiratory: Clear to auscultation bilaterally  Discharge Instructions   Discharge Instructions     Call MD for:  difficulty breathing, headache or visual disturbances   Complete by: As directed    Call MD for:  extreme fatigue   Complete by: As directed    Call MD for:  hives   Complete by: As directed    Call MD for:  persistant dizziness or light-headedness   Complete by: As directed    Call MD for:  persistant nausea and vomiting   Complete by: As directed    Call MD for:  redness, tenderness, or signs of infection (pain, swelling, redness, odor or green/yellow discharge around incision site)   Complete by: As directed    Call MD for:  severe uncontrolled pain   Complete by: As directed    Call MD for:  temperature >100.4   Complete by: As directed    Diet - low sodium heart healthy   Complete by: As directed    Discharge instructions   Complete by: As directed    May shower. Low fat diet. No heavy lifting   Increase activity slowly   Complete by: As directed  No wound care   Complete by: As directed       Allergies as of 04/25/2021       Reactions   Horse-derived Products    Redness & swelling of arm   Other Anaphylaxis   Tree nuts (not peanuts)  Other reaction(s): Other (See  Comments) Uncoded Allergy. Allergen: Other Reaction: red and swelling   Penicillins    Rash 04/20/2021 tolerated Zosyn   Shellfish Allergy Other (See Comments)   Other Reaction: throat closes up   Shellfish-derived Products    Throat swelling   Bacitracin-polymyxin B    Local swelling   Eggs Or Egg-derived Products    REACTION: Unable to get Flu Vaccine Skin Tests+ for egg products   Neomycin-bacitracin Zn-polymyx         Medication List     TAKE these medications    citalopram 20 MG tablet Commonly known as: CELEXA TAKE ONE-HALF TABLET BY MOUTH DAILY AS DIRECTED   diphenhydrAMINE 25 mg capsule Commonly known as: BENADRYL Take 25 mg by mouth as needed.   EPINEPHrine 0.3 mg/0.3 mL Soaj injection Commonly known as: EPI-PEN Inject 0.3 mg into the muscle as needed for anaphylaxis.   HYDROcodone-acetaminophen 5-325 MG tablet Commonly known as: NORCO/VICODIN Take 1 tablet by mouth every 6 (six) hours as needed for moderate pain.   levothyroxine 88 MCG tablet Commonly known as: SYNTHROID TAKE 1 TABLET(88 MCG) BY MOUTH DAILY   metoprolol tartrate 100 MG tablet Commonly known as: LOPRESSOR TAKE 1 TABLET BY MOUTH EVERY MORNING, AND 1/2 TABLET EVERY EVENING What changed:  how to take this when to take this   omeprazole 20 MG capsule Commonly known as: PRILOSEC TAKE 1 CAPSULE(20 MG) BY MOUTH DAILY   pravastatin 40 MG tablet Commonly known as: PRAVACHOL Take 1 tablet (40 mg total) by mouth daily. What changed:  how much to take when to take this   spironolactone 25 MG tablet Commonly known as: ALDACTONE TAKE 1 TABLET(25 MG) BY MOUTH DAILY       Allergies  Allergen Reactions   Horse-Derived Products     Redness & swelling of arm   Other Anaphylaxis    Tree nuts (not peanuts)  Other reaction(s): Other (See Comments) Uncoded Allergy. Allergen: Other Reaction: red and swelling   Penicillins     Rash 04/20/2021 tolerated Zosyn    Shellfish Allergy Other  (See Comments)    Other Reaction: throat closes up   Shellfish-Derived Products     Throat swelling   Bacitracin-Polymyxin B     Local swelling   Eggs Or Egg-Derived Products     REACTION: Unable to get Flu Vaccine Skin Tests+ for egg products   Neomycin-Bacitracin Zn-Polymyx     Follow-up Information     Ileana Roup, MD Follow up in 2 week(s).   Specialties: General Surgery, Colon and Rectal Surgery Contact information: [place]  [id number] [phone]                  The results of significant diagnostics from this hospitalization (including imaging, microbiology, ancillary and laboratory) are listed below for reference.    Significant Diagnostic Studies: CT ABDOMEN PELVIS W CONTRAST  Result Date: 04/19/2021 CLINICAL DATA:  Abdominal distension.  Upper abdominal pain. EXAM: CT ABDOMEN AND PELVIS WITH CONTRAST TECHNIQUE: Multidetector CT imaging of the abdomen and pelvis was performed using the standard protocol following bolus administration of intravenous contrast. CONTRAST:  2m OMNIPAQUE IOHEXOL 350 MG/ML SOLN COMPARISON:  None. FINDINGS: Lower chest: A 5 mm right lower lobe pulmonary nodule is only partially included in the field of view, image 1 series 6. There is a 4 mm subpleural nodule in the right lower lobe, series 6, image 5. No pleural effusion. There is fluid within the distal esophagus with small hiatal hernia. Hepatobiliary: There is intra and extrahepatic biliary ductal dilatation with multiple distal common bile duct stones. At least 6 stones are seen within the mid and distal common bile duct, measuring approximately 5 mm. Common bile duct measures 14 mm. Gallbladder is distended and contains innumerable intraluminal gallstones. There is mild gallbladder wall thickening. Prominent intrahepatic biliary ductal dilatation. Occasional small hepatic cysts. No suspicious liver lesion. Pancreas: Mild peripancreatic fat stranding about the  distal body and tail. There is no pancreatic ductal dilatation. No evidence of pancreatic mass. Spleen: Normal in size without focal abnormality. Adrenals/Urinary Tract: There is a 13 mm left adrenal nodule, nonspecific. Normal right adrenal gland. No hydronephrosis or perinephric edema. Homogeneous renal enhancement with symmetric excretion on delayed phase imaging. Urinary bladder is physiologically distended without wall thickening. Stomach/Bowel: Small hiatal hernia distended with intraluminal fluid. Fluid-filled stomach. No gastric wall thickening. No duodenal wall thickening. There is a small periampullary duodenal diverticulum. Decompressed small bowel. Appendix is not confidently visualized, no appendicitis. Moderate volume of colonic stool. The sigmoid colon is redundant. There is prominent diverticulosis involving the distal descending and sigmoid colon. No diverticulitis. Cystic structure posterior to the rectum measures 7.0 x 3.9 cm, this was described on remote 2001 pelvic MRI and is considered benign. Vascular/Lymphatic: Moderate aortic atherosclerosis. No aortic aneurysm. Patent portal vein. No acute vascular findings. No abdominopelvic adenopathy. Reproductive: Atrophic uterus. There is a 12 mm cyst in the right ovary. No follow-up imaging recommended. Note: This recommendation does not apply to premenarchal patients and to those with increased risk (genetic, family history, elevated tumor markers or other high-risk factors) of ovarian cancer. Reference: JACR 2020 Feb; 17(2):248-254 Other: Cyst posterior to the rectum as described above. No free air or ascites. No abdominal wall hernia. Musculoskeletal: There are no acute or suspicious osseous abnormalities. Age related degenerative change in the spine. IMPRESSION: 1. Choledocholithiasis with multiple distal common bile duct stones, intra and extrahepatic biliary ductal dilatation, and gallbladder distention. Mild gallbladder wall thickening.  Multiple intraluminal gallstones. While MRCP is often recommended to assess the biliary tree, stones are readily apparent on CT, and MRCP is not recommended in this elderly patient, as choledocholithiasis is well visualized by CT. 2. Mild peripancreatic fat stranding about the distal body and tail, suspicious for gallstone pancreatitis. There is no pancreatic ductal dilatation or evidence of pancreatic mass. 3. Colonic diverticulosis without diverticulitis. 4. Small hiatal hernia with fluid within the distal esophagus, suggesting reflux. 5. Right lower lobe pulmonary nodules, largest measuring 5 mm. No follow-up needed if patient is low-risk (and has no known or suspected primary neoplasm). Non-contrast chest CT can be considered in 12 months if patient is high-risk. This recommendation follows the consensus statement: Guidelines for Management of Incidental Pulmonary Nodules Detected on CT Images: From the Fleischner Society 2017; Radiology 2017; 284:228-243. 6. A 13 mm left adrenal nodule is nonspecific. 7. Benign cyst posterior to the rectum, described on remote 2001 pelvic MRI and considered benign. Aortic Atherosclerosis (ICD10-I70.0). Electronically Signed   By: Keith Rake M.D.   On: 04/19/2021 23:42   DG ERCP BILIARY & PANCREATIC DUCTS  Result Date: 04/23/2021 CLINICAL DATA:  86 year old female with a history of  choledocholithiasis EXAM: ERCP TECHNIQUE: Multiple spot images obtained with the fluoroscopic device and submitted for interpretation post-procedure. FLUOROSCOPY TIME:  Fluoroscopy Time:  1 minutes 6 seconds COMPARISON:  CT 04/19/2021 FINDINGS: Limited images during ERCP. Initial image demonstrates the endoscope projecting over the upper abdomen. Subsequently there is cannulation of the ampulla with retrograde infusion of contrast. Partial opacification of the extrahepatic biliary system. Multiple geometric filling defects within the common bile duct. Subsequently there is placement of  angioplasty balloon and a balloon retrieval catheter. IMPRESSION: Limited images during ERCP demonstrates treatment of choledocholithiasis with deployment of a retrieval balloon and angioplasty balloon. Please refer to the dictated operative report for full details of intraoperative findings and procedure. Electronically Signed   By: Corrie Mckusick D.O.   On: 04/23/2021 14:21    Microbiology: Recent Results (from the past 240 hour(s))  Resp Panel by RT-PCR (Flu A&B, Covid) Nasopharyngeal Swab     Status: None   Collection Time: 04/20/21 12:04 AM   Specimen: Nasopharyngeal Swab; Nasopharyngeal(NP) swabs in vial transport medium  Result Value Ref Range Status   SARS Coronavirus 2 by RT PCR NEGATIVE NEGATIVE Final    Comment: (NOTE) SARS-CoV-2 target nucleic acids are NOT DETECTED.  The SARS-CoV-2 RNA is generally detectable in upper respiratory specimens during the acute phase of infection. The lowest concentration of SARS-CoV-2 viral copies this assay can detect is 138 copies/mL. A negative result does not preclude SARS-Cov-2 infection and should not be used as the sole basis for treatment or other patient management decisions. A negative result may occur with  improper specimen collection/handling, submission of specimen other than nasopharyngeal swab, presence of viral mutation(s) within the areas targeted by this assay, and inadequate number of viral copies(<138 copies/mL). A negative result must be combined with clinical observations, patient history, and epidemiological information. The expected result is Negative.  Fact Sheet for Patients:  EntrepreneurPulse.com.au  Fact Sheet for Healthcare Providers:  IncredibleEmployment.be  This test is no t yet approved or cleared by the [place] FDA and  has been authorized for detection and/or diagnosis of SARS-CoV-2 by FDA under an Emergency Use Authorization (EUA). This EUA will remain  in effect  (meaning this test can be used) for the duration of the COVID-19 declaration under Section 564(b)(1) of the Act, 21 U.S.C.section 360bbb-3(b)(1), unless the authorization is terminated  or revoked sooner.       Influenza A by PCR NEGATIVE NEGATIVE Final   Influenza B by PCR NEGATIVE NEGATIVE Final    Comment: (NOTE) The Xpert Xpress SARS-CoV-2/FLU/RSV plus assay is intended as an aid in the diagnosis of influenza from Nasopharyngeal swab specimens and should not be used as a sole basis for treatment. Nasal washings and aspirates are unacceptable for Xpert Xpress SARS-CoV-2/FLU/RSV testing.  Fact Sheet for Patients: EntrepreneurPulse.com.au  Fact Sheet for Healthcare Providers: IncredibleEmployment.be  This test is not yet approved or cleared by the [place] FDA and has been authorized for detection and/or diagnosis of SARS-CoV-2 by FDA under an Emergency Use Authorization (EUA). This EUA will remain in effect (meaning this test can be used) for the duration of the COVID-19 declaration under Section 564(b)(1) of the Act, 21 U.S.C. section 360bbb-3(b)(1), unless the authorization is terminated or revoked.  Performed at [facility], [place] [address], [place], [place] [address]      Labs: Basic Metabolic Panel: Recent Labs  Lab 04/21/21 0525 04/22/21 0851 04/23/21 0538 04/24/21 0523 04/25/21 0436  NA 130* 133* 134* 132* 130*  K 3.2* 4.1 4.0 4.4 4.4  CL 98 103 105 102 101  CO2 '25 24 23 '$ 21* 22  GLUCOSE 93 113* 103* 137* 134*  BUN 11 <5* <5* 6* 7*  CREATININE 0.49 0.53 0.43* 0.50 0.46  CALCIUM 8.0* 8.7* 8.6* 8.7* 8.2*  MG 1.7  --   --   --   --    Liver Function Tests: Recent Labs  Lab 04/20/21 0412 04/21/21 0525 04/22/21 0851 04/23/21 0538 04/25/21 0436  AST 606* 178* 78* 42* 23  ALT 372* 183* 127* 89* 51*  ALKPHOS 104 85 96 85 66  BILITOT 2.6* 3.0* 1.3* 1.2 0.8  PROT 5.9* 4.9* 5.9* 5.6* 5.6*   ALBUMIN 3.4* 2.8* 3.4* 3.3* 3.1*   Recent Labs  Lab 04/20/21 0412 04/21/21 0525 04/22/21 0851 04/23/21 0538 04/25/21 0436  LIPASE 1,041* 268* 99* 70* 33   No results for input(s): AMMONIA in the last 168 hours. CBC: Recent Labs  Lab 04/20/21 0412 04/21/21 0525 04/22/21 0851 04/23/21 0538 04/24/21 0523 04/25/21 0436  WBC 20.8* 11.9* 6.4 5.6 6.1 14.8*  NEUTROABS 18.8*  --   --   --   --  12.8*  HGB 11.9* 10.2* 11.3* 11.1* 12.1 10.7*  HCT 33.7* 29.8* 32.5* 32.4* 35.6* 32.2*  MCV 91.6 94.3 92.6 92.3 94.4 95.0  PLT 204 168 197 201 234 219   Cardiac Enzymes: No results for input(s): CKTOTAL, CKMB, CKMBINDEX, TROPONINI in the last 168 hours. BNP: BNP (last 3 results) No results for input(s): BNP in the last 8760 hours.  ProBNP (last 3 results) No results for input(s): PROBNP in the last 8760 hours.  CBG: Recent Labs  Lab 04/23/21 1748 04/23/21 2350 04/24/21 1529 04/25/21 0000 04/25/21 0739  GLUCAP 159* 144* 182* 145* 119*       Signed:  Oswald Hillock MD.  [facility] 04/25/2021, 11:21 AM

## 2021-04-26 ENCOUNTER — Encounter (HOSPITAL_COMMUNITY): Payer: Self-pay | Admitting: Gastroenterology

## 2021-04-27 LAB — SURGICAL PATHOLOGY

## 2021-04-30 ENCOUNTER — Encounter: Payer: Self-pay | Admitting: Family Medicine

## 2021-04-30 ENCOUNTER — Ambulatory Visit (INDEPENDENT_AMBULATORY_CARE_PROVIDER_SITE_OTHER): Payer: Medicare Other | Admitting: Family Medicine

## 2021-04-30 ENCOUNTER — Other Ambulatory Visit: Payer: Self-pay

## 2021-04-30 ENCOUNTER — Other Ambulatory Visit: Payer: Medicare Other

## 2021-04-30 DIAGNOSIS — R319 Hematuria, unspecified: Secondary | ICD-10-CM | POA: Diagnosis not present

## 2021-04-30 DIAGNOSIS — R300 Dysuria: Secondary | ICD-10-CM

## 2021-04-30 DIAGNOSIS — N390 Urinary tract infection, site not specified: Secondary | ICD-10-CM | POA: Diagnosis not present

## 2021-04-30 LAB — POC URINALSYSI DIPSTICK (AUTOMATED)
Bilirubin, UA: NEGATIVE
Glucose, UA: NEGATIVE
Ketones, UA: NEGATIVE
Nitrite, UA: NEGATIVE
Protein, UA: POSITIVE — AB
Spec Grav, UA: <=1.005 — AB (ref 1.010–1.025)
Urobilinogen, UA: 0.2 E.U./dL
pH, UA: 7 (ref 5.0–8.0)

## 2021-04-30 MED ORDER — CIPROFLOXACIN HCL 500 MG PO TABS
500.0000 mg | ORAL_TABLET | Freq: Two times a day (BID) | ORAL | 0 refills | Status: DC
Start: 2021-04-30 — End: 2021-06-18

## 2021-04-30 NOTE — Progress Notes (Signed)
   Subjective:    Patient ID: Purcell Nails, female    DOB: 04/23/1935, 86 y.o.   MRN: [id number]  HPI She is here as a walk-in complaining of several days of urinary urgency and burning. No fever or back pain or nausea. She is drinking plenty of water.    Review of Systems  Constitutional: Negative.   Respiratory: Negative.    Cardiovascular: Negative.   Gastrointestinal: Negative.   Genitourinary:  Positive for dysuria, frequency and urgency. Negative for flank pain.      Objective:   Physical Exam Constitutional:      Appearance: Normal appearance.  Neurological:     Mental Status: She is alert.          Assessment & Plan:  UTI, treat with Cipro for 7 days. Culture the sample.  Alysia Penna, MD

## 2021-05-02 LAB — URINE CULTURE
MICRO NUMBER:: 12297007
SPECIMEN QUALITY:: ADEQUATE

## 2021-05-08 ENCOUNTER — Other Ambulatory Visit: Payer: Self-pay | Admitting: Family Medicine

## 2021-05-08 DIAGNOSIS — I10 Essential (primary) hypertension: Secondary | ICD-10-CM

## 2021-05-25 ENCOUNTER — Telehealth: Payer: Self-pay | Admitting: Family Medicine

## 2021-05-25 NOTE — Telephone Encounter (Signed)
Left message for patient to call back and schedule Medicare Annual Wellness Visit (AWV) either virtually or in office. Left  my Herbie Drape number [phone]   Last AWV 06/11/20  please schedule at anytime with [facility] Nurse Health Advisor 1 or 2   This should be a 45 minute visit.

## 2021-06-15 ENCOUNTER — Ambulatory Visit (INDEPENDENT_AMBULATORY_CARE_PROVIDER_SITE_OTHER): Payer: Medicare Other

## 2021-06-15 DIAGNOSIS — Z780 Asymptomatic menopausal state: Secondary | ICD-10-CM

## 2021-06-15 DIAGNOSIS — Z0000 Encounter for general adult medical examination without abnormal findings: Secondary | ICD-10-CM

## 2021-06-15 DIAGNOSIS — Z0100 Encounter for examination of eyes and vision without abnormal findings: Secondary | ICD-10-CM

## 2021-06-15 DIAGNOSIS — Z59819 Housing instability, housed unspecified: Secondary | ICD-10-CM

## 2021-06-15 NOTE — Patient Instructions (Signed)
[name] [name] , Thank you for taking time to come for your Medicare Wellness Visit. I appreciate your ongoing commitment to your health goals. Please review the following plan we discussed and let me know if I can assist you in the future.   Screening recommendations/referrals: Colonoscopy: no longer required  Mammogram: no longer required  Bone Density: 07/23/2020 Recommended yearly ophthalmology/optometry visit for glaucoma screening and checkup Recommended yearly dental visit for hygiene and checkup  Vaccinations: Influenza vaccine: due in fall 2022  Pneumococcal vaccine: completed series  Tdap vaccine: due with injury  Shingles vaccine: declined     Advanced directives: none   Conditions/risks identified: patient unable to afford current rent interested in finding a government housing community  for 84 and older   Next appointment: none    Preventive Care 46 Years and Older, Female Preventive care refers to lifestyle choices and visits with your health care provider that can promote health and wellness. What does preventive care include? A yearly physical exam. This is also called an annual well check. Dental exams once or twice a year. Routine eye exams. Ask your health care provider how often you should have your eyes checked. Personal lifestyle choices, including: Daily care of your teeth and gums. Regular physical activity. Eating a healthy diet. Avoiding tobacco and drug use. Limiting alcohol use. Practicing safe sex. Taking low-dose aspirin every day. Taking vitamin and mineral supplements as recommended by your health care provider. What happens during an annual well check? The services and screenings done by your health care provider during your annual well check will depend on your age, overall health, lifestyle risk factors, and family history of disease. Counseling  Your health care provider may ask you questions about your: Alcohol use. Tobacco use. Drug  use. Emotional well-being. Home and relationship well-being. Sexual activity. Eating habits. History of falls. Memory and ability to understand (cognition). Work and work Statistician. Reproductive health. Screening  You may have the following tests or measurements: Height, weight, and BMI. Blood pressure. Lipid and cholesterol levels. These may be checked every 5 years, or more frequently if you are over 17 years old. Skin check. Lung cancer screening. You may have this screening every year starting at age 70 if you have a 30-pack-year history of smoking and currently smoke or have quit within the past 15 years. Fecal occult blood test (FOBT) of the stool. You may have this test every year starting at age 50. Flexible sigmoidoscopy or colonoscopy. You may have a sigmoidoscopy every 5 years or a colonoscopy every 10 years starting at age 41. Hepatitis C blood test. Hepatitis B blood test. Sexually transmitted disease (STD) testing. Diabetes screening. This is done by checking your blood sugar (glucose) after you have not eaten for a while (fasting). You may have this done every 1-3 years. Bone density scan. This is done to screen for osteoporosis. You may have this done starting at age 66. Mammogram. This may be done every 1-2 years. Talk to your health care provider about how often you should have regular mammograms. Talk with your health care provider about your test results, treatment options, and if necessary, the need for more tests. Vaccines  Your health care provider may recommend certain vaccines, such as: Influenza vaccine. This is recommended every year. Tetanus, diphtheria, and acellular pertussis (Tdap, Td) vaccine. You may need a Td booster every 10 years. Zoster vaccine. You may need this after age 39. Pneumococcal 13-valent conjugate (PCV13) vaccine. One dose is recommended after  age 28. Pneumococcal polysaccharide (PPSV23) vaccine. One dose is recommended after age  84. Talk to your health care provider about which screenings and vaccines you need and how often you need them. This information is not intended to replace advice given to you by your health care provider. Make sure you discuss any questions you have with your health care provider. Document Released: 09/18/2015 Document Revised: 05/11/2016 Document Reviewed: 06/23/2015 Elsevier Interactive Patient Education  2017 [place] Prevention in the Home Falls can cause injuries. They can happen to people of all ages. There are many things you can do to make your home safe and to help prevent falls. What can I do on the outside of my home? Regularly fix the edges of walkways and driveways and fix any cracks. Remove anything that might make you trip as you walk through a door, such as a raised step or threshold. Trim any bushes or trees on the path to your home. Use bright outdoor lighting. Clear any walking paths of anything that might make someone trip, such as rocks or tools. Regularly check to see if handrails are loose or broken. Make sure that both sides of any steps have handrails. Any raised decks and porches should have guardrails on the edges. Have any leaves, snow, or ice cleared regularly. Use sand or salt on walking paths during winter. Clean up any spills in your garage right away. This includes oil or grease spills. What can I do in the bathroom? Use night lights. Install grab bars by the toilet and in the tub and shower. Do not use towel bars as grab bars. Use non-skid mats or decals in the tub or shower. If you need to sit down in the shower, use a plastic, non-slip stool. Keep the floor dry. Clean up any water that spills on the floor as soon as it happens. Remove soap buildup in the tub or shower regularly. Attach bath mats securely with double-sided non-slip rug tape. Do not have throw rugs and other things on the floor that can make you trip. What can I do in the  bedroom? Use night lights. Make sure that you have a light by your bed that is easy to reach. Do not use any sheets or blankets that are too big for your bed. They should not hang down onto the floor. Have a firm chair that has side arms. You can use this for support while you get dressed. Do not have throw rugs and other things on the floor that can make you trip. What can I do in the kitchen? Clean up any spills right away. Avoid walking on wet floors. Keep items that you use a lot in easy-to-reach places. If you need to reach something above you, use a strong step stool that has a grab bar. Keep electrical cords out of the way. Do not use floor polish or wax that makes floors slippery. If you must use wax, use non-skid floor wax. Do not have throw rugs and other things on the floor that can make you trip. What can I do with my stairs? Do not leave any items on the stairs. Make sure that there are handrails on both sides of the stairs and use them. Fix handrails that are broken or loose. Make sure that handrails are as long as the stairways. Check any carpeting to make sure that it is firmly attached to the stairs. Fix any carpet that is loose or worn. Avoid having throw rugs  at the top or bottom of the stairs. If you do have throw rugs, attach them to the floor with carpet tape. Make sure that you have a light switch at the top of the stairs and the bottom of the stairs. If you do not have them, ask someone to add them for you. What else can I do to help prevent falls? Wear shoes that: Do not have high heels. Have rubber bottoms. Are comfortable and fit you well. Are closed at the toe. Do not wear sandals. If you use a stepladder: Make sure that it is fully opened. Do not climb a closed stepladder. Make sure that both sides of the stepladder are locked into place. Ask someone to hold it for you, if possible. Clearly mark and make sure that you can see: Any grab bars or  handrails. First and last steps. Where the edge of each step is. Use tools that help you move around (mobility aids) if they are needed. These include: Canes. Walkers. Scooters. Crutches. Turn on the lights when you go into a dark area. Replace any light bulbs as soon as they burn out. Set up your furniture so you have a clear path. Avoid moving your furniture around. If any of your floors are uneven, fix them. If there are any pets around you, be aware of where they are. Review your medicines with your doctor. Some medicines can make you feel dizzy. This can increase your chance of falling. Ask your doctor what other things that you can do to help prevent falls. This information is not intended to replace advice given to you by your health care provider. Make sure you discuss any questions you have with your health care provider. Document Released: 06/18/2009 Document Revised: 01/28/2016 Document Reviewed: 09/26/2014 Elsevier Interactive Patient Education  2017 [employer].

## 2021-06-15 NOTE — Progress Notes (Signed)
Subjective:   [name] [name] is a 86 y.o. female who presents for Medicare Annual (Subsequent) preventive examination.   I connected with [name] today by telephone and verified that I am speaking with the correct person using two identifiers. Location patient: home Location provider: work Persons participating in the virtual visit: patient, provider.   I discussed the limitations, risks, security and privacy concerns of performing an evaluation and management service by telephone and the availability of in person appointments. I also discussed with the patient that there may be a patient responsible charge related to this service. The patient expressed understanding and verbally consented to this telephonic visit.    Interactive audio and video telecommunications were attempted between this provider and patient, however failed, due to patient having technical difficulties OR patient did not have access to video capability.  We continued and completed visit with audio only.    Review of Systems     Cardiac Risk Factors include: advanced age (>65men, >89 women)     Objective:    Today's Vitals   There is no height or weight on file to calculate BMI.  Advanced Directives 06/15/2021 04/23/2021 04/19/2021 06/11/2020 07/21/2018  Does Patient Have a Medical Advance Directive? No No No Yes No  Type of Advance Directive - - - Living will;Healthcare Power of Attorney -  Does patient want to make changes to medical advance directive? - - - No - Patient declined -  Copy of [place] in Chart? - - - Yes - validated most recent copy scanned in chart (See row information) -  Would patient like information on creating a medical advance directive? No - Patient declined No - Guardian declined No - Patient declined - No - Patient declined    Current Medications (verified) Outpatient Encounter Medications as of 06/15/2021  Medication Sig   citalopram (CELEXA) 20 MG  tablet TAKE ONE-HALF TABLET BY MOUTH DAILY AS DIRECTED   diphenhydrAMINE (BENADRYL) 25 mg capsule Take 25 mg by mouth as needed.     EPINEPHrine 0.3 mg/0.3 mL IJ SOAJ injection Inject 0.3 mg into the muscle as needed for anaphylaxis.   levothyroxine (SYNTHROID) 88 MCG tablet TAKE 1 TABLET(88 MCG) BY MOUTH DAILY   metoprolol tartrate (LOPRESSOR) 100 MG tablet TAKE 1 TABLET BY MOUTH EVERY MORNING, AND 1/2 TABLET EVERY EVENING   omeprazole (PRILOSEC) 20 MG capsule TAKE 1 CAPSULE(20 MG) BY MOUTH DAILY   pravastatin (PRAVACHOL) 40 MG tablet Take 1 tablet (40 mg total) by mouth daily. (Patient taking differently: Take 20 mg by mouth 2 (two) times daily.)   spironolactone (ALDACTONE) 25 MG tablet TAKE 1 TABLET(25 MG) BY MOUTH DAILY   ciprofloxacin (CIPRO) 500 MG tablet Take 1 tablet (500 mg total) by mouth 2 (two) times daily. (Patient not taking: Reported on 06/15/2021)   HYDROcodone-acetaminophen (NORCO/VICODIN) 5-325 MG tablet Take 1 tablet by mouth every 6 (six) hours as needed for moderate pain. (Patient not taking: Reported on 06/15/2021)   No facility-administered encounter medications on file as of 06/15/2021.    Allergies (verified) Horse-derived products, Other, Penicillins, Shellfish allergy, Shellfish-derived products, Bacitracin-polymyxin b, Eggs or egg-derived products, and Neomycin-bacitracin zn-polymyx   History: Past Medical History:  Diagnosis Date   Allergic angioedema    from shellfish and tree nuts   Chest pain 2000 and 2001   negative cardiac workup   Complication of anesthesia    Diverticulosis    Fibrocystic breast disease    Hyperlipidemia    Hypertension  Irritable bowel syndrome    Perirectal cyst    ?lymphocoele   PMR (polymyalgia rheumatica) (HCC) 2011   PONV (postoperative nausea and vomiting)    Seborrheic dermatitis    Squamous cell skin cancer    X1, [facility]   Thyroid disease    post op hypothyroidism; TSH goal would be sup;pressive   Past Surgical  History:  Procedure Laterality Date   BILIARY DILATION  04/23/2021   Procedure: BILIARY DILATION;  Surgeon: Jackquline Denmark, MD;  Location: [facility];  Service: Endoscopy;;   CHOLECYSTECTOMY N/A 04/24/2021   Procedure: LAPAROSCOPIC CHOLECYSTECTOMY;  Surgeon: Ileana Roup, MD;  Location: [facility];  Service: General;  Laterality: N/A;  90   COLONOSCOPY  2004   per Dr. Olevia Perches, clear but incomplete; Barium study added, repeat in 10 yrs    DILATION AND CURETTAGE OF UTERUS     ERCP N/A 04/23/2021   Procedure: ENDOSCOPIC RETROGRADE CHOLANGIOPANCREATOGRAPHY (ERCP);  Surgeon: Jackquline Denmark, MD;  Location: [facility];  Service: Endoscopy;  Laterality: N/A;   ESOPHAGOGASTRODUODENOSCOPY (EGD) WITH PROPOFOL N/A 07/21/2018   Procedure: ESOPHAGOGASTRODUODENOSCOPY (EGD) WITH PROPOFOL;  Surgeon: Jackquline Denmark, MD;  Location: [facility];  Service: Endoscopy;  Laterality: N/A;   ESOPHAGOGASTRODUODENOSCOPY (EGD) WITH PROPOFOL N/A 04/23/2021   Procedure: ESOPHAGOGASTRODUODENOSCOPY (EGD) WITH PROPOFOL;  Surgeon: Jackquline Denmark, MD;  Location: [facility];  Service: Endoscopy;  Laterality: N/A;   exporatory surgery for rectal mass  1986   peri rectal cyst   FOREIGN BODY REMOVAL  07/21/2018   Procedure: FOREIGN BODY REMOVAL;  Surgeon: Jackquline Denmark, MD;  Location: [facility];  Service: Endoscopy;;   REMOVAL OF STONES  04/23/2021   Procedure: REMOVAL OF STONES;  Surgeon: Jackquline Denmark, MD;  Location: [facility];  Service: Endoscopy;;   SPHINCTEROTOMY  04/23/2021   Procedure: Joan Mayans;  Surgeon: Jackquline Denmark, MD;  Location: [facility];  Service: Endoscopy;;   THYROIDECTOMY  1988   S/P radiation previously seen by Liliane Bade, MD @ [facility], now followed by Dr. Leonides Schanz   TONSILLECTOMY  1972   Family History  Problem Relation Age of Onset   Parkinsonism Brother    Hypertension Mother    Prostate cancer Father    Breast cancer Maternal Grandmother    Hypertension Maternal Grandmother     Heart attack Paternal Uncle 29   Heart attack Paternal Grandfather 76   Social History   Socioeconomic History   Marital status: Single    Spouse name: Not on file   Number of children: Not on file   Years of education: Not on file   Highest education level: Not on file  Occupational History   Not on file  Tobacco Use   Smoking status: Never   Smokeless tobacco: Never  Substance and Sexual Activity   Alcohol use: Yes    Alcohol/week: 0.0 standard drinks    Comment: very rarely   Drug use: No   Sexual activity: Not on file  Other Topics Concern   Not on file  Social History Narrative   Not on file   Social Determinants of Health   Financial Resource Strain: Medium Risk   Difficulty of Paying Living Expenses: Somewhat hard  Food Insecurity: No Food Insecurity   Worried About Running Out of Food in the Last Year: Never true   Ran Out of Food in the Last Year: Never true  Transportation Needs: Unmet Transportation Needs   Lack of Transportation (Medical): No   Lack of Transportation (Non-Medical): Yes  Physical Activity: Not  on file  Stress: Stress Concern Present   Feeling of Stress : To some extent  Social Connections: Socially Isolated   Frequency of Communication with Friends and Family: Twice a week   Frequency of Social Gatherings with Friends and Family: Twice a week   Attends Religious Services: Never   Printmaker: No   Attends Music therapist: Never   Marital Status: Divorced    Tobacco Counseling Counseling given: Not Answered   Clinical Intake:  Pre-visit preparation completed: Yes  Pain : No/denies pain     Nutritional Risks: None Diabetes: No  How often do you need to have someone help you when you read instructions, pamphlets, or other written materials from your doctor or pharmacy?: 1 - Never What is the last grade level you completed in school?: college  Diabetic?no  Interpreter Needed?:  No  Information entered by :: [place] of Daily Living In your present state of health, do you have any difficulty performing the following activities: 06/15/2021 04/20/2021  Hearing? N -  Vision? N -  Difficulty concentrating or making decisions? N -  Walking or climbing stairs? N -  Dressing or bathing? N -  Doing errands, shopping? N N  Preparing Food and eating ? N -  Using the Toilet? N -  In the past six months, have you accidently leaked urine? N -  Do you have problems with loss of bowel control? N -  Managing your Medications? N -  Managing your Finances? N -  Housekeeping or managing your Housekeeping? N -  Some recent data might be hidden    Patient Care Team: [name], MD as PCP - General (Family Medicine)  Indicate any recent Medical Services you may have received from other than [facility] providers in the past year (date may be approximate).     Assessment:   This is a routine wellness examination for [place].  Hearing/Vision screen Vision Screening - Comments:: Declines annual eye exam referral completed 06/15/2021  Dietary issues and exercise activities discussed: Current Exercise Habits: The patient does not participate in regular exercise at present   Goals Addressed             This Visit's Progress    Exercise 3x per week (30 min per time)   On track      Depression Screen PHQ 2/9 Scores 06/15/2021 06/15/2021 06/11/2020 06/08/2015 04/24/2014  PHQ - 2 Score 0 0 0 0 0  PHQ- 9 Score - - 0 - -    Fall Risk Fall Risk  06/15/2021 06/11/2020 04/05/2019 06/08/2015 04/24/2014  Falls in the past year? 0 0 (No Data) No No  Comment - - Emmi Telephone Survey: data to providers prior to load - -  Number falls in past yr: 0 0 (No Data) - -  Comment - - Emmi Telephone Survey Actual Response =  - -  Injury with Fall? 0 0 - - -  Risk for fall due to : - No Fall Risks - - -  Follow up Falls evaluation completed Falls evaluation completed;Falls  prevention discussed - - -    FALL RISK PREVENTION PERTAINING TO THE HOME:  Any stairs in or around the home? Yes  If so, are there any without handrails? No  Home free of loose throw rugs in walkways, pet beds, electrical cords, etc? Yes  Adequate lighting in your home to reduce risk of falls? Yes   ASSISTIVE DEVICES  UTILIZED TO PREVENT FALLS:  Life alert? No  Use of a cane, walker or w/c? No  Grab bars in the bathroom? Yes  Shower chair or bench in shower? No  Elevated toilet seat or a handicapped toilet? No    Cognitive Function:  Normal cognitive status assessed by direct observation by this Nurse Health Advisor. No abnormalities found.     6CIT Screen 06/11/2020  What Year? 0 points  What month? 0 points  What time? 0 points  Count back from 20 0 points  Months in reverse 0 points  Repeat phrase 0 points  Total Score 0    Immunizations Immunization History  Administered Date(s) Administered   PFIZER(Purple Top)SARS-COV-2 Vaccination 09/25/2019, 10/16/2019   Pneumococcal Conjugate-13 04/24/2014   Pneumococcal Polysaccharide-23 07/14/2010    TDAP status: Due, Education has been provided regarding the importance of this vaccine. Advised may receive this vaccine at local pharmacy or Health Dept. Aware to provide a copy of the vaccination record if obtained from local pharmacy or Health Dept. Verbalized acceptance and understanding.  Flu Vaccine status: Due, Education has been provided regarding the importance of this vaccine. Advised may receive this vaccine at local pharmacy or Health Dept. Aware to provide a copy of the vaccination record if obtained from local pharmacy or Health Dept. Verbalized acceptance and understanding.  Pneumococcal vaccine status: Up to date  Covid-19 vaccine status: Completed vaccines  Qualifies for Shingles Vaccine? Yes   Zostavax completed No   Shingrix Completed?: No.    Education has been provided regarding the importance of this  vaccine. Patient has been advised to call insurance company to determine out of pocket expense if they have not yet received this vaccine. Advised may also receive vaccine at local pharmacy or Health Dept. Verbalized acceptance and understanding.  Screening Tests Health Maintenance  Topic Date Due   TETANUS/TDAP  Never done   Zoster Vaccines- Shingrix (1 of 2) Never done   COVID-19 Vaccine (3 - Pfizer risk series) 11/13/2019   INFLUENZA VACCINE  Never done   DEXA SCAN  Completed   HPV VACCINES  Aged Out    Health Maintenance  Health Maintenance Due  Topic Date Due   TETANUS/TDAP  Never done   Zoster Vaccines- Shingrix (1 of 2) Never done   COVID-19 Vaccine (3 - Pfizer risk series) 11/13/2019   INFLUENZA VACCINE  Never done    Colorectal cancer screening: No longer required.   Mammogram status: No longer required due to age.  Bone Density status: Completed 07/23/2020. Results reflect: Bone density results: OSTEOPENIA. Repeat every 5 years.  Lung Cancer Screening: (Low Dose CT Chest recommended if Age 52-80 years, 30 pack-year currently smoking OR have quit w/in 15years.) does not qualify.   Lung Cancer Screening Referral: n/a  Additional Screening:  Hepatitis C Screening: does not qualify;   Vision Screening: Recommended annual ophthalmology exams for early detection of glaucoma and other disorders of the eye. Is the patient up to date with their annual eye exam?  No  Who is the provider or what is the name of the office in which the patient attends annual eye exams? Referral 06/15/2021 If pt is not established with a provider, would they like to be referred to a provider to establish care? Yes .   Dental Screening: Recommended annual dental exams for proper oral hygiene  Community Resource Referral / Chronic Care Management: CRR required this visit?  Yes   CCM required this visit?  No  Plan:     I have personally reviewed and noted the following in the  patient's chart:   Medical and social history Use of alcohol, tobacco or illicit drugs  Current medications and supplements including opioid prescriptions.  Functional ability and status Nutritional status Physical activity Advanced directives List of other physicians Hospitalizations, surgeries, and ER visits in previous 12 months Vitals Screenings to include cognitive, depression, and falls Referrals and appointments  In addition, I have reviewed and discussed with patient certain preventive protocols, quality metrics, and best practice recommendations. A written personalized care plan for preventive services as well as general preventive health recommendations were provided to patient.     Randel Pigg, LPN   19/09/2222   Nurse Notes: patient unable to afford current rent interested in finding a government housing community  for 108 and older

## 2021-06-18 ENCOUNTER — Ambulatory Visit (INDEPENDENT_AMBULATORY_CARE_PROVIDER_SITE_OTHER): Payer: Medicare Other | Admitting: Family Medicine

## 2021-06-18 ENCOUNTER — Encounter: Payer: Self-pay | Admitting: Family Medicine

## 2021-06-18 ENCOUNTER — Other Ambulatory Visit: Payer: Self-pay

## 2021-06-18 VITALS — BP 116/74 | HR 56 | Temp 97.7°F | Wt 136.4 lb

## 2021-06-18 DIAGNOSIS — Z85850 Personal history of malignant neoplasm of thyroid: Secondary | ICD-10-CM | POA: Diagnosis not present

## 2021-06-18 DIAGNOSIS — I10 Essential (primary) hypertension: Secondary | ICD-10-CM

## 2021-06-18 DIAGNOSIS — E782 Mixed hyperlipidemia: Secondary | ICD-10-CM | POA: Diagnosis not present

## 2021-06-18 DIAGNOSIS — F418 Other specified anxiety disorders: Secondary | ICD-10-CM | POA: Diagnosis not present

## 2021-06-18 DIAGNOSIS — M353 Polymyalgia rheumatica: Secondary | ICD-10-CM | POA: Diagnosis not present

## 2021-06-18 DIAGNOSIS — F320 Major depressive disorder, single episode, mild: Secondary | ICD-10-CM | POA: Diagnosis not present

## 2021-06-18 DIAGNOSIS — E890 Postprocedural hypothyroidism: Secondary | ICD-10-CM | POA: Diagnosis not present

## 2021-06-18 DIAGNOSIS — R739 Hyperglycemia, unspecified: Secondary | ICD-10-CM

## 2021-06-18 LAB — BASIC METABOLIC PANEL
BUN: 12 mg/dL (ref 6–23)
CO2: 29 mEq/L (ref 19–32)
Calcium: 9.3 mg/dL (ref 8.4–10.5)
Chloride: 97 mEq/L (ref 96–112)
Creatinine, Ser: 0.52 mg/dL (ref 0.40–1.20)
GFR: 85.66 mL/min (ref >60.00)
Glucose, Bld: 100 mg/dL — ABNORMAL HIGH (ref 70–99)
Potassium: 3.8 mEq/L (ref 3.5–5.1)
Sodium: 132 mEq/L — ABNORMAL LOW (ref 135–145)

## 2021-06-18 LAB — LIPID PANEL
Cholesterol: 147 mg/dL (ref 0–200)
HDL: 38.6 mg/dL — ABNORMAL LOW (ref >39.00)
LDL Cholesterol: 84 mg/dL (ref 0–99)
NonHDL: 108.32
Total CHOL/HDL Ratio: 4
Triglycerides: 121 mg/dL (ref 0.0–149.0)
VLDL: 24.2 mg/dL (ref 0.0–40.0)

## 2021-06-18 LAB — HEPATIC FUNCTION PANEL
ALT: 8 U/L (ref 0–35)
AST: 17 U/L (ref 0–37)
Albumin: 4.2 g/dL (ref 3.5–5.2)
Alkaline Phosphatase: 53 U/L (ref 39–117)
Bilirubin, Direct: 0.1 mg/dL (ref 0.0–0.3)
Total Bilirubin: 0.7 mg/dL (ref 0.2–1.2)
Total Protein: 6.6 g/dL (ref 6.0–8.3)

## 2021-06-18 LAB — CBC WITH DIFFERENTIAL/PLATELET
Basophils Absolute: 0 10*3/uL (ref 0.0–0.1)
Basophils Relative: 0.6 % (ref 0.0–3.0)
Eosinophils Absolute: 0.1 10*3/uL (ref 0.0–0.7)
Eosinophils Relative: 1.5 % (ref 0.0–5.0)
HCT: 36.4 % (ref 36.0–46.0)
Hemoglobin: 12.5 g/dL (ref 12.0–15.0)
Lymphocytes Relative: 33 % (ref 12.0–46.0)
Lymphs Abs: 2.3 10*3/uL (ref 0.7–4.0)
MCHC: 34.3 g/dL (ref 30.0–36.0)
MCV: 91.5 fl (ref 78.0–100.0)
Monocytes Absolute: 0.6 10*3/uL (ref 0.1–1.0)
Monocytes Relative: 7.9 % (ref 3.0–12.0)
Neutro Abs: 4 10*3/uL (ref 1.4–7.7)
Neutrophils Relative %: 57 % (ref 43.0–77.0)
Platelets: 243 10*3/uL (ref 150.0–400.0)
RBC: 3.97 Mil/uL (ref 3.87–5.11)
RDW: 12.6 % (ref 11.5–15.5)
WBC: 7 10*3/uL (ref 4.0–10.5)

## 2021-06-18 LAB — HEMOGLOBIN A1C: Hgb A1c MFr Bld: 5.7 % (ref 4.6–6.5)

## 2021-06-18 LAB — T3, FREE: T3, Free: 2.7 pg/mL (ref 2.3–4.2)

## 2021-06-18 LAB — TSH: TSH: 6.66 u[IU]/mL — ABNORMAL HIGH (ref 0.35–5.50)

## 2021-06-18 LAB — T4, FREE: Free T4: 1.07 ng/dL (ref 0.60–1.60)

## 2021-06-18 MED ORDER — PRAVASTATIN SODIUM 40 MG PO TABS: 40.0000 mg | ORAL_TABLET | Freq: Every day | ORAL | 3 refills | Status: DC

## 2021-06-18 MED ORDER — CITALOPRAM HYDROBROMIDE 20 MG PO TABS: 10.0000 mg | ORAL_TABLET | Freq: Every day | ORAL | 1 refills | Status: DC

## 2021-06-18 MED ORDER — OMEPRAZOLE 20 MG PO CPDR: DELAYED_RELEASE_CAPSULE | ORAL | 3 refills | Status: DC

## 2021-06-18 MED ORDER — LEVOTHYROXINE SODIUM 88 MCG PO TABS: ORAL_TABLET | ORAL | 3 refills | Status: DC

## 2021-06-18 NOTE — Progress Notes (Signed)
   Subjective:    Patient ID: [name] [name], female    DOB: 08-29-1935, 86 y.o.   MRN: [id number]  HPI Here to follow up on issues. She feels well in general. She has recovered completely from a cholecystectomy and ERCP this summer for multiple gallstones. Her BMs are now regular. No pain or nausea. Her appetite is good. Her BP is stable. Her GERD is stable. Her weight is stable. Her moods are good.    Review of Systems  Constitutional: Negative.   Respiratory: Negative.    Cardiovascular: Negative.   Gastrointestinal: Negative.   Psychiatric/Behavioral:  Negative for agitation, behavioral problems, confusion, decreased concentration, dysphoric mood, hallucinations and sleep disturbance. The patient is not nervous/anxious.       Objective:   Physical Exam Constitutional:      Appearance: She is obese.  Cardiovascular:     Rate and Rhythm: Normal rate and regular rhythm.     Pulses: Normal pulses.     Heart sounds: Normal heart sounds.  Pulmonary:     Effort: Pulmonary effort is normal.     Breath sounds: Normal breath sounds.  Abdominal:     General: Abdomen is flat. Bowel sounds are normal. There is no distension.     Palpations: Abdomen is soft. There is no mass.     Tenderness: There is no abdominal tenderness. There is no guarding or rebound.     Hernia: No hernia is present.  Neurological:     Mental Status: She is alert.  Psychiatric:        Mood and Affect: Mood normal.        Behavior: Behavior normal.        Thought Content: Thought content normal.          Assessment & Plan:  Her HTN and GERD and PR and depression are all stable. We will get fasting labs today to check on lipids, thyroid levels, etc. We spent a total of ( 34  ) minutes reviewing records and discussing these issues.  Alysia Penna, MD

## 2021-06-21 ENCOUNTER — Other Ambulatory Visit: Payer: Self-pay

## 2021-06-21 DIAGNOSIS — E890 Postprocedural hypothyroidism: Secondary | ICD-10-CM

## 2021-06-21 MED ORDER — LEVOTHYROXINE SODIUM 100 MCG PO TABS: ORAL_TABLET | ORAL | 3 refills | Status: DC

## 2021-06-23 ENCOUNTER — Telehealth: Payer: Self-pay | Admitting: Family Medicine

## 2021-06-23 NOTE — Telephone Encounter (Signed)
Patient is requesting a call to discuss lab results. She stated that she didn't want to pick up thyroid medication without knowing results. Patient is also requesting a copy to be sent to her.  Patient could be contacted at [phone].  Please advise.

## 2021-06-24 NOTE — Telephone Encounter (Signed)
Spoke with pt verbalized understanding of her lab results

## 2021-07-01 ENCOUNTER — Telehealth: Payer: Self-pay | Admitting: Family Medicine

## 2021-07-01 ENCOUNTER — Other Ambulatory Visit: Payer: Self-pay | Admitting: Family Medicine

## 2021-07-01 ENCOUNTER — Telehealth: Payer: Self-pay

## 2021-07-01 DIAGNOSIS — E782 Mixed hyperlipidemia: Secondary | ICD-10-CM

## 2021-07-01 NOTE — Telephone Encounter (Signed)
   Telephone encounter was:  Unsuccessful.  07/01/2021 Name: [name] [name] MRN: [id number] DOB: 1935-04-12  Unsuccessful outbound call made today to assist with:   senior housing.   Outreach Attempt:  1st Attempt  A HIPAA compliant voice message was left requesting a return call.  Instructed patient to call back at [phone].  Ireta Pullman, AAS Paralegal, [facility]  [address], [place] [address] ??[email or web address]  ?? [phone]   [email or web address]

## 2021-07-01 NOTE — Telephone Encounter (Signed)
Patient states she has a question about her blood work.  She is requesting a call back on Friday afternoon after 3:00 pm.

## 2021-07-02 ENCOUNTER — Telehealth: Payer: Self-pay | Admitting: Family Medicine

## 2021-07-02 NOTE — Telephone Encounter (Signed)
Patient called to follow up on lab results sen to her. I let patient know [name] [name] was out of the office today and I would leave a message for her. She stated that was fine and she would call again Monday morning       Good callback number is [phone]     Please advise

## 2021-07-02 NOTE — Telephone Encounter (Signed)
Tried to call pt left a message to call the office back regarding her lab results

## 2021-07-06 NOTE — Telephone Encounter (Signed)
Attempted to call pt left a detailed message to return my call in the office

## 2021-07-06 NOTE — Telephone Encounter (Signed)
Duplicate message. 

## 2021-07-12 ENCOUNTER — Telehealth: Payer: Self-pay

## 2021-07-12 NOTE — Telephone Encounter (Signed)
   Telephone encounter was:  Unsuccessful.  07/12/2021 Name: [name] [name] MRN: [id number] DOB: [birth year]  Unsuccessful outbound call made today to assist with:   senior housing.  Outreach Attempt:  2nd Attempt  A HIPAA compliant voice message was left requesting a return call.  Instructed patient to call back at [phone].  Jomarion Mish, AAS Paralegal, [facility]  [address],  [address] ??[email or web address]  ?? [phone]   [email or web address]

## 2021-07-13 ENCOUNTER — Other Ambulatory Visit: Payer: Self-pay | Admitting: Family Medicine

## 2021-07-13 DIAGNOSIS — Z780 Asymptomatic menopausal state: Secondary | ICD-10-CM

## 2021-07-14 ENCOUNTER — Telehealth: Payer: Self-pay

## 2021-07-14 NOTE — Telephone Encounter (Signed)
   Telephone encounter was:  Successful.  07/14/2021 Name: [name] [name] MRN: [id number] DOB: [birth year]  [name] [name] is a 86 y.o. year old female who is a primary care patient of [name], MD . The community resource team was consulted for assistance with Food Insecurity and senior housing  Care guide performed the following interventions: Spoke with patient confirmed email sent information for senior housing, food banks and food stamps      Follow Up Plan:  Care guide will follow up with patient by phone over the next 6-71 days  Maie Kesinger Andree Elk, AAS Paralegal, [facility]  [address], [place] [address] ??[email or web address]  ?? [phone]   [email or web address]

## 2021-07-16 ENCOUNTER — Telehealth: Payer: Self-pay

## 2021-07-16 NOTE — Telephone Encounter (Signed)
Spoke with pt reviewed lab results, verbalized understanding

## 2021-07-16 NOTE — Telephone Encounter (Signed)
   Telephone encounter was:  Unsuccessful.  07/16/2021 Name: [name] [name] MRN: [id number] DOB: 30-Dec-1934  Unsuccessful outbound call made today to assist with:  Left message on voicemail to follow-up with patient about receipt of emailed information sent 07/14/21. Left name and number to contact. Letter saved in Epic.  Outreach Attempt:  3rd Attempt.  Referral closed unable to contact patient.  A HIPAA compliant voice message was left requesting a return call.  Instructed patient to call back at [phone].   Eliu Batch, AAS Paralegal, [facility]  [address], [place] [address] ??[email or web address]  ?? [phone]   [email or web address]

## 2021-07-22 ENCOUNTER — Other Ambulatory Visit: Payer: Self-pay | Admitting: Internal Medicine

## 2021-07-22 DIAGNOSIS — I10 Essential (primary) hypertension: Secondary | ICD-10-CM

## 2021-08-09 ENCOUNTER — Other Ambulatory Visit: Payer: Self-pay | Admitting: Family Medicine

## 2021-08-09 DIAGNOSIS — I10 Essential (primary) hypertension: Secondary | ICD-10-CM

## 2021-09-21 ENCOUNTER — Encounter: Payer: Self-pay | Admitting: Family Medicine

## 2021-09-21 DIAGNOSIS — H18513 Endothelial corneal dystrophy, bilateral: Secondary | ICD-10-CM | POA: Diagnosis not present

## 2021-09-21 DIAGNOSIS — H353130 Nonexudative age-related macular degeneration, bilateral, stage unspecified: Secondary | ICD-10-CM | POA: Diagnosis not present

## 2021-09-21 DIAGNOSIS — H25813 Combined forms of age-related cataract, bilateral: Secondary | ICD-10-CM | POA: Diagnosis not present

## 2021-09-22 ENCOUNTER — Other Ambulatory Visit (INDEPENDENT_AMBULATORY_CARE_PROVIDER_SITE_OTHER): Payer: Medicare Other

## 2021-09-22 ENCOUNTER — Telehealth: Payer: Self-pay | Admitting: Family Medicine

## 2021-09-22 DIAGNOSIS — E890 Postprocedural hypothyroidism: Secondary | ICD-10-CM | POA: Diagnosis not present

## 2021-09-22 LAB — T3, FREE: T3, Free: 2.9 pg/mL (ref 2.3–4.2)

## 2021-09-22 LAB — TSH: TSH: 10.24 u[IU]/mL — ABNORMAL HIGH (ref 0.35–5.50)

## 2021-09-22 LAB — T4, FREE: Free T4: 1.01 ng/dL (ref 0.60–1.60)

## 2021-09-22 NOTE — Telephone Encounter (Signed)
Patient is requesting a call back.

## 2021-09-22 NOTE — Telephone Encounter (Signed)
Pt called stated that she needed some help getting an appointment with Social Services for [facility] due to some struggles she is experiencing with the Inflation, Advised pt that I will call social services in the morning and get more information on how she can start the process of application.

## 2021-09-22 NOTE — Addendum Note (Signed)
Addended by: Alysia Penna A on: 09/22/2021 05:28 PM   Modules accepted: Orders

## 2021-09-23 NOTE — Telephone Encounter (Signed)
Left a detailed message for pt to call the [facility] for more information on where to begin the application process for assistance

## 2021-09-28 NOTE — Telephone Encounter (Signed)
Pt came by the office and I reviewed lab results and also gave information regarding social services assistance program

## 2021-10-01 ENCOUNTER — Telehealth: Payer: Self-pay | Admitting: Family Medicine

## 2021-10-01 NOTE — Telephone Encounter (Signed)
Patient stopped by office to let us know that she had not received a call from [facility] and something needs to be done by [place]. I gave patient the number for endo and let her know that she can call them as we have sent the referral in but I would let [facility] know she hadn't received the call. Pt verbalized understanding

## 2021-10-05 NOTE — Telephone Encounter (Signed)
Noted  

## 2021-10-16 ENCOUNTER — Other Ambulatory Visit: Payer: Self-pay | Admitting: Family Medicine

## 2021-12-13 ENCOUNTER — Ambulatory Visit
Admission: RE | Admit: 2021-12-13 | Discharge: 2021-12-13 | Disposition: A | Discharge status: Home / Self Care | Payer: Medicare Other | Source: Ambulatory Visit | Attending: Family Medicine | Admitting: Family Medicine

## 2021-12-13 DIAGNOSIS — M85852 Other specified disorders of bone density and structure, left thigh: Secondary | ICD-10-CM | POA: Diagnosis not present

## 2021-12-13 DIAGNOSIS — Z780 Asymptomatic menopausal state: Secondary | ICD-10-CM | POA: Diagnosis not present

## 2021-12-28 ENCOUNTER — Encounter: Payer: Self-pay | Admitting: *Deleted

## 2021-12-29 ENCOUNTER — Telehealth: Payer: Self-pay | Admitting: Family Medicine

## 2021-12-29 DIAGNOSIS — R300 Dysuria: Secondary | ICD-10-CM

## 2021-12-29 NOTE — Telephone Encounter (Signed)
Last OV- 06/18/2021.  Please advise. ?

## 2021-12-29 NOTE — Telephone Encounter (Signed)
Okay she can drop a sample off  ?

## 2021-12-29 NOTE — Telephone Encounter (Signed)
Patient wants order for urine analysis as she is having UTI symptoms. Declined appt this afternoon and only wanted message to be sent back.  ? ? ? ? ? ? ?Please advise  ?

## 2021-12-30 ENCOUNTER — Other Ambulatory Visit (INDEPENDENT_AMBULATORY_CARE_PROVIDER_SITE_OTHER): Payer: Medicare Other

## 2021-12-30 ENCOUNTER — Other Ambulatory Visit: Payer: Self-pay

## 2021-12-30 DIAGNOSIS — R300 Dysuria: Secondary | ICD-10-CM

## 2021-12-30 LAB — URINALYSIS, ROUTINE W REFLEX MICROSCOPIC
Bilirubin Urine: NEGATIVE
Ketones, ur: NEGATIVE
Nitrite: NEGATIVE
Specific Gravity, Urine: <=1.005 — AB (ref 1.000–1.030)
Total Protein, Urine: NEGATIVE
Urine Glucose: NEGATIVE
Urobilinogen, UA: 0.2 (ref 0.0–1.0)
pH: 6.5 (ref 5.0–8.0)

## 2021-12-30 NOTE — Telephone Encounter (Signed)
Pt is aware ok to drop off urine sample ?

## 2021-12-30 NOTE — Addendum Note (Signed)
Addended by: Elza Rafter D on: 12/30/2021 02:54 PM ? ? Modules accepted: Orders ? ?

## 2022-01-21 ENCOUNTER — Other Ambulatory Visit: Payer: Self-pay | Admitting: Family Medicine

## 2022-01-21 DIAGNOSIS — I10 Essential (primary) hypertension: Secondary | ICD-10-CM

## 2022-04-19 ENCOUNTER — Other Ambulatory Visit: Payer: Self-pay | Admitting: Family Medicine

## 2022-04-19 DIAGNOSIS — I10 Essential (primary) hypertension: Secondary | ICD-10-CM

## 2022-05-22 ENCOUNTER — Other Ambulatory Visit: Payer: Self-pay | Admitting: Family Medicine

## 2022-05-22 DIAGNOSIS — E782 Mixed hyperlipidemia: Secondary | ICD-10-CM

## 2022-05-30 ENCOUNTER — Telehealth: Payer: Self-pay | Admitting: Family Medicine

## 2022-05-30 NOTE — Telephone Encounter (Signed)
Left message for patient to call back and schedule Medicare Annual Wellness Visit (AWV) either virtually or in office. Left  my Herbie Drape number [phone]   Last AWV 06/15/21 ; please schedule at anytime with [facility] Nurse Health Advisor 1 or 2

## 2022-06-20 ENCOUNTER — Telehealth: Payer: Self-pay | Admitting: Family Medicine

## 2022-06-20 NOTE — Telephone Encounter (Signed)
Left message for patient to call back and schedule Medicare Annual Wellness Visit (AWV) either virtually or in office. Left  my jabber number [phone]   Last AWV 06/15/21 please schedule with Nurse Health Adviser   45 min for awv-i and in office appointments 30 min for awv-s  phone/virtual appointments  

## 2022-06-28 ENCOUNTER — Other Ambulatory Visit: Payer: Self-pay | Admitting: Family Medicine

## 2022-06-30 ENCOUNTER — Other Ambulatory Visit: Payer: Self-pay | Admitting: Family Medicine

## 2022-07-06 DIAGNOSIS — Z23 Encounter for immunization: Secondary | ICD-10-CM | POA: Diagnosis not present

## 2022-07-19 ENCOUNTER — Other Ambulatory Visit: Payer: Self-pay | Admitting: Family Medicine

## 2022-07-19 DIAGNOSIS — I10 Essential (primary) hypertension: Secondary | ICD-10-CM

## 2022-07-22 ENCOUNTER — Encounter: Payer: Self-pay | Admitting: Internal Medicine

## 2022-07-22 ENCOUNTER — Ambulatory Visit (INDEPENDENT_AMBULATORY_CARE_PROVIDER_SITE_OTHER): Payer: Medicare Other | Admitting: Internal Medicine

## 2022-07-22 VITALS — BP 118/70 | HR 56 | Ht 65.0 in | Wt 150.0 lb

## 2022-07-22 DIAGNOSIS — C73 Malignant neoplasm of thyroid gland: Secondary | ICD-10-CM

## 2022-07-22 DIAGNOSIS — E890 Postprocedural hypothyroidism: Secondary | ICD-10-CM | POA: Diagnosis not present

## 2022-07-22 LAB — TSH: TSH: 10.52 u[IU]/mL — ABNORMAL HIGH (ref 0.35–5.50)

## 2022-07-22 NOTE — Progress Notes (Unsigned)
Name: [name] [name]  MRN/ DOB: [id number], 01-21-1935    Age/ Sex: 87 y.o., female    PCP: [name], MD   Reason for Endocrinology Evaluation: Hypothyroidism     Date of Initial Endocrinology Evaluation: 07/22/2022     HPI: [name] is a 87 y.o. female with a past medical history of Htn, Dyslipidemia and RA. The patient presented for initial endocrinology clinic visit on 07/22/2022 for consultative assistance with her Hypothyroidism.   She is S/P total thyroidectomy 21/3086 due to Follicular thyroid carcinoma. She is S/P RAI ablation 1989    Her last thyroid ultrasound was in 2014 with NO evidence of recurrence   She has been noted with weight gain  She does not wait 30 minutes before she eats breakfast  She is on vitamins in the morning  She has IBS with alternating movement  S/P esophogeal dilatation  She denies local neck swelling  Energy level is acceptable  Has chronic cold intolerance   Levothyroxine 88 mcg daily       HISTORY:  Past Medical History:  Past Medical History:  Diagnosis Date   Allergic angioedema    from shellfish and tree nuts   Chest pain 2000 and 2001   negative cardiac workup   Complication of anesthesia    Diverticulosis    Fibrocystic breast disease    Hyperlipidemia    Hypertension    Irritable bowel syndrome    Perirectal cyst    ?lymphocoele   PMR (polymyalgia rheumatica) ([facility]) 2011   PONV (postoperative nausea and vomiting)    Seborrheic dermatitis    Squamous cell skin cancer    X1, [facility]   Thyroid disease    post op hypothyroidism; TSH goal would be sup;pressive   Past Surgical History:  Past Surgical History:  Procedure Laterality Date   BILIARY DILATION  04/23/2021   Procedure: BILIARY DILATION;  Surgeon: Jackquline Denmark, MD;  Location: [facility] ENDOSCOPY;  Service: Endoscopy;;   CHOLECYSTECTOMY N/A 04/24/2021   Procedure: LAPAROSCOPIC CHOLECYSTECTOMY;  Surgeon: Ileana Roup, MD;  Location: [facility]  ORS;  Service: General;  Laterality: N/A;  90   COLONOSCOPY  2004   per Dr. Olevia Perches, clear but incomplete; Barium study added, repeat in 10 yrs    DILATION AND CURETTAGE OF UTERUS     ERCP N/A 04/23/2021   Procedure: ENDOSCOPIC RETROGRADE CHOLANGIOPANCREATOGRAPHY (ERCP);  Surgeon: Jackquline Denmark, MD;  Location: [facility];  Service: Endoscopy;  Laterality: N/A;   ESOPHAGOGASTRODUODENOSCOPY (EGD) WITH PROPOFOL N/A 07/21/2018   Procedure: ESOPHAGOGASTRODUODENOSCOPY (EGD) WITH PROPOFOL;  Surgeon: Jackquline Denmark, MD;  Location: [facility];  Service: Endoscopy;  Laterality: N/A;   ESOPHAGOGASTRODUODENOSCOPY (EGD) WITH PROPOFOL N/A 04/23/2021   Procedure: ESOPHAGOGASTRODUODENOSCOPY (EGD) WITH PROPOFOL;  Surgeon: Jackquline Denmark, MD;  Location: [facility];  Service: Endoscopy;  Laterality: N/A;   exporatory surgery for rectal mass  1986   peri rectal cyst   FOREIGN BODY REMOVAL  07/21/2018   Procedure: FOREIGN BODY REMOVAL;  Surgeon: Jackquline Denmark, MD;  Location: [facility];  Service: Endoscopy;;   REMOVAL OF STONES  04/23/2021   Procedure: REMOVAL OF STONES;  Surgeon: Jackquline Denmark, MD;  Location: [facility];  Service: Endoscopy;;   SPHINCTEROTOMY  04/23/2021   Procedure: Joan Mayans;  Surgeon: Jackquline Denmark, MD;  Location: [facility];  Service: Endoscopy;;   THYROIDECTOMY  1988   S/P radiation previously seen by Liliane Bade, [facility] @ [facility], now followed by Dr. Leonides Schanz   TONSILLECTOMY  [phone]  Social History:  reports that she has never smoked. She has never used smokeless tobacco. She reports current alcohol use. She reports that she does not use drugs. Family History: family history includes Breast cancer in her maternal grandmother; Heart attack (age of onset: 45) in her paternal uncle; Heart attack (age of onset: 69) in her paternal grandfather; Hypertension in her maternal grandmother and mother; Parkinsonism in her brother; Prostate cancer in her father.   HOME  MEDICATIONS: Allergies as of 07/22/2022       Reactions   Horse-derived Products    Redness & swelling of arm   Other Anaphylaxis   Tree nuts (not peanuts)  Other reaction(s): Other (See Comments) Uncoded Allergy. Allergen: Other Reaction: red and swelling   Penicillins    Rash 04/20/2021 tolerated Zosyn   Shellfish Allergy Other (See Comments)   Other Reaction: throat closes up   Shellfish-derived Products    Throat swelling   Bacitracin-polymyxin B    Local swelling   Eggs Or Egg-derived Products    REACTION: Unable to get Flu Vaccine Skin Tests+ for egg products   Neomycin-bacitracin Zn-polymyx         Medication List        Accurate as of July 22, 2022  1:05 PM. If you have any questions, ask your nurse or doctor.          CALCIUM 500 PO Take 1 tablet by mouth daily at 6 (six) AM.   citalopram 20 MG tablet Commonly known as: CELEXA Take 0.5 tablets (10 mg total) by mouth daily.   diphenhydrAMINE 25 mg capsule Commonly known as: BENADRYL Take 25 mg by mouth as needed.   EPINEPHrine 0.3 mg/0.3 mL Soaj injection Commonly known as: EPI-PEN Inject 0.3 mg into the muscle as needed for anaphylaxis.   levothyroxine 88 MCG tablet Commonly known as: SYNTHROID Take 88 mcg by mouth daily before breakfast. What changed: Another medication with the same name was removed. Continue taking this medication, and follow the directions you see here. Changed by: Dorita Sciara, MD   metoprolol tartrate 100 MG tablet Commonly known as: LOPRESSOR TAKE 1 TABLET BY MOUTH EVERY MORNING AND 1/2 TABLET BY MOUTH EVERY EVENING   omeprazole 20 MG capsule Commonly known as: PRILOSEC TAKE 1 CAPSULE(20 MG) BY MOUTH DAILY   pravastatin 40 MG tablet Commonly known as: PRAVACHOL TAKE 1 TABLET(40 MG) BY MOUTH DAILY   spironolactone 25 MG tablet Commonly known as: ALDACTONE TAKE 1 TABLET(25 MG) BY MOUTH DAILY   VITAMIN D3 GUMMIES PO Take 1 tablet by mouth daily at 6  (six) AM.          REVIEW OF SYSTEMS: A comprehensive ROS was conducted with the patient and is negative except as per HPI and below:  ROS     OBJECTIVE:  VS: BP 118/70 (BP Location: Left Arm, Patient Position: Sitting, Cuff Size: Small)   Pulse (!) 56   Ht '5\' 5"'$  (1.651 m)   Wt 150 lb (68 kg)   SpO2 95%   BMI 24.96 kg/m    Wt Readings from Last 3 Encounters:  07/22/22 150 lb (68 kg)  06/18/21 136 lb 6 oz (61.9 kg)  04/25/21 154 lb 15.7 oz (70.3 kg)     EXAM: General: Pt appears well and is in NAD  Eyes: External eye exam normal without stare, lid lag or exophthalmos.  EOM intact.  PERRL.  Neck: General: Supple without adenopathy. Thyroid: Thyroid size normal.  No goiter or  nodules appreciated. No thyroid bruit.  Lungs: Clear with good BS bilat with no rales, rhonchi, or wheezes  Heart: Auscultation: RRR.  Abdomen: Normoactive bowel sounds, soft, nontender, without masses or organomegaly palpable  Extremities:  BL LE: No pretibial edema normal ROM and strength.  Mental Status: Judgment, insight: Intact Orientation: Oriented to time, place, and person Mood and affect: No depression, anxiety, or agitation     DATA REVIEWED: ***    ASSESSMENT/PLAN/RECOMMENDATIONS:   Postoperative Hypothyroidism:    - Pt educated extensively on the correct way to take levothyroxine (first thing in the morning with water, 30 minutes before eating or taking other medications). - Pt encouraged to double dose the following day if she were to miss a dose given long half-life of levothyroxine.   Medications : Levothyroxine 88 mcg daily    2. Hx of Follicular thyroid carcinoma  - She is 35 yrs out from total thyroidectomy  - S/P RAI ablation in 1989 - Last ultrasound in 2014 showed no evidence of recurrence  - Will check Tg and Tg Ab 's as a baseline   Signed electronically by: Mack Guise, MD  [facility]  [facility] [address] [address] [place], [place] [address] Phone: [phone] FAX: [phone]   CC: [name], [address] [address] Phone: [phone] Fax: [phone]   Return to Endocrinology clinic as below: Future Appointments  Date Time Provider [place]  08/02/2022  1:00 PM [name], MD [id number]

## 2022-07-22 NOTE — Patient Instructions (Signed)

## 2022-07-25 ENCOUNTER — Telehealth: Payer: Self-pay | Admitting: Internal Medicine

## 2022-07-25 LAB — THYROGLOBULIN LEVEL: Thyroglobulin: 0.1 ng/mL — ABNORMAL LOW

## 2022-07-25 LAB — THYROGLOBULIN ANTIBODY: Thyroglobulin Ab: <1 IU/mL (ref <=1)

## 2022-07-25 MED ORDER — LEVOTHYROXINE SODIUM 100 MCG PO TABS: 100.0000 ug | ORAL_TABLET | Freq: Every day | ORAL | 3 refills | Status: DC

## 2022-07-25 NOTE — Telephone Encounter (Signed)
LMTCB

## 2022-07-25 NOTE — Telephone Encounter (Signed)
Please let the patient know that her thyroid test continues to be abnormal, and it shows that the levothyroxine 88 mcg a day is not enough for her    Please asked the patient to stop levothyroxine 88 mcg  Start levothyroxine 100 mcg daily     Thanks

## 2022-07-26 NOTE — Telephone Encounter (Signed)
Patient advised and verbalized understanding 

## 2022-07-26 NOTE — Telephone Encounter (Signed)
LMTCB

## 2022-07-27 ENCOUNTER — Other Ambulatory Visit: Payer: Self-pay | Admitting: Family Medicine

## 2022-08-02 ENCOUNTER — Encounter: Payer: Self-pay | Admitting: Family Medicine

## 2022-08-02 ENCOUNTER — Ambulatory Visit (INDEPENDENT_AMBULATORY_CARE_PROVIDER_SITE_OTHER): Payer: Medicare Other | Admitting: Family Medicine

## 2022-08-02 ENCOUNTER — Telehealth: Payer: Self-pay

## 2022-08-02 VITALS — BP 118/76 | HR 56 | Temp 98.2°F | Ht 65.0 in | Wt 151.0 lb

## 2022-08-02 DIAGNOSIS — M6289 Other specified disorders of muscle: Secondary | ICD-10-CM

## 2022-08-02 DIAGNOSIS — M353 Polymyalgia rheumatica: Secondary | ICD-10-CM

## 2022-08-02 DIAGNOSIS — I10 Essential (primary) hypertension: Secondary | ICD-10-CM | POA: Diagnosis not present

## 2022-08-02 DIAGNOSIS — E890 Postprocedural hypothyroidism: Secondary | ICD-10-CM | POA: Diagnosis not present

## 2022-08-02 DIAGNOSIS — C73 Malignant neoplasm of thyroid gland: Secondary | ICD-10-CM | POA: Diagnosis not present

## 2022-08-02 DIAGNOSIS — M170 Bilateral primary osteoarthritis of knee: Secondary | ICD-10-CM | POA: Diagnosis not present

## 2022-08-02 DIAGNOSIS — F418 Other specified anxiety disorders: Secondary | ICD-10-CM | POA: Diagnosis not present

## 2022-08-02 DIAGNOSIS — R739 Hyperglycemia, unspecified: Secondary | ICD-10-CM | POA: Diagnosis not present

## 2022-08-02 DIAGNOSIS — F320 Major depressive disorder, single episode, mild: Secondary | ICD-10-CM | POA: Diagnosis not present

## 2022-08-02 DIAGNOSIS — K219 Gastro-esophageal reflux disease without esophagitis: Secondary | ICD-10-CM

## 2022-08-02 DIAGNOSIS — E782 Mixed hyperlipidemia: Secondary | ICD-10-CM

## 2022-08-02 MED ORDER — MELOXICAM 15 MG PO TABS: 15.0000 mg | ORAL_TABLET | Freq: Every day | ORAL | 3 refills | Status: DC

## 2022-08-02 MED ORDER — TRIMETHOPRIM 100 MG PO TABS: 100.0000 mg | ORAL_TABLET | Freq: Every day | ORAL | 3 refills | Status: DC

## 2022-08-02 MED ORDER — PRAVASTATIN SODIUM 40 MG PO TABS: ORAL_TABLET | ORAL | 3 refills | Status: DC

## 2022-08-02 MED ORDER — SPIRONOLACTONE 25 MG PO TABS: 25.0000 mg | ORAL_TABLET | Freq: Every day | ORAL | 3 refills | Status: DC

## 2022-08-02 MED ORDER — METOPROLOL TARTRATE 100 MG PO TABS: ORAL_TABLET | ORAL | 3 refills | Status: DC

## 2022-08-02 MED ORDER — LEVOTHYROXINE SODIUM 125 MCG PO TABS: 125.0000 ug | ORAL_TABLET | Freq: Every day | ORAL | 3 refills | Status: DC

## 2022-08-02 MED ORDER — CITALOPRAM HYDROBROMIDE 20 MG PO TABS: 10.0000 mg | ORAL_TABLET | Freq: Every day | ORAL | 3 refills | Status: DC

## 2022-08-02 NOTE — Telephone Encounter (Signed)
Per pharmacy patient picked up  Levothyroxine  68mg 06/18/21 and then new script was sent in for 105m and she has been filling this dose since 06/21/21.

## 2022-08-02 NOTE — Progress Notes (Signed)
Subjective:    Patient ID: [name] [name], female    DOB: [birth year], 87 y.o.   MRN: [id number]  HPI Here to follow up on issues. She is seeing Dr. Kelton Pillar for her hypothyroidism. She complains of bilateral knee pain over the past few years. Ibuprofen no longer helps like it used to. Also she has been told that she has a "dropped bladder" and this causes her to have leakage of urine frequently during the night. She asks for a referral to a specialist for this. She has been diagnosed with interstitial cystitis, and this causes frequent burning during urination. Years ago she took Trimethaprim daily for maintenance and it worked well. Her BP has been stable. Her PMR has been in remission for years. Her GERD is controlled with diet, and she no longer takes Omeprazole.    Review of Systems  Constitutional: Negative.   HENT: Negative.    Eyes: Negative.   Respiratory: Negative.    Cardiovascular: Negative.   Gastrointestinal: Negative.   Genitourinary:  Positive for dysuria and frequency. Negative for decreased urine volume, difficulty urinating, dyspareunia, enuresis, flank pain, hematuria, pelvic pain and urgency.  Musculoskeletal:  Positive for arthralgias.  Skin: Negative.   Neurological: Negative.  Negative for headaches.  Psychiatric/Behavioral: Negative.         Objective:   Physical Exam Constitutional:      General: She is not in acute distress.    Appearance: Normal appearance. She is well-developed.  HENT:     Head: Normocephalic and atraumatic.     Right Ear: External ear normal.     Left Ear: External ear normal.     Nose: Nose normal.     Mouth/Throat:     Pharynx: No oropharyngeal exudate.  Eyes:     General: No scleral icterus.    Conjunctiva/sclera: Conjunctivae normal.     Pupils: Pupils are equal, round, and reactive to light.  Neck:     Thyroid: No thyromegaly.     Vascular: No JVD.  Cardiovascular:     Rate and Rhythm: Normal rate and regular  rhythm.     Heart sounds: Normal heart sounds. No murmur heard.    No friction rub. No gallop.  Pulmonary:     Effort: Pulmonary effort is normal. No respiratory distress.     Breath sounds: Normal breath sounds. No wheezing or rales.  Chest:     Chest wall: No tenderness.  Abdominal:     General: Bowel sounds are normal. There is no distension.     Palpations: Abdomen is soft. There is no mass.     Tenderness: There is no abdominal tenderness. There is no guarding or rebound.  Musculoskeletal:        General: No tenderness. Normal range of motion.     Cervical back: Normal range of motion and neck supple.  Lymphadenopathy:     Cervical: No cervical adenopathy.  Skin:    General: Skin is warm and dry.     Findings: No erythema or rash.  Neurological:     Mental Status: She is alert and oriented to person, place, and time.     Cranial Nerves: No cranial nerve deficit.     Motor: No abnormal muscle tone.     Coordination: Coordination normal.     Deep Tendon Reflexes: Reflexes are normal and symmetric. Reflexes normal.  Psychiatric:        Behavior: Behavior normal.        Thought Content: Thought  content normal.        Judgment: Judgment normal.           Assessment & Plan:  Her HTN and GERD are well controlled. She has OA in both knees, so she will try taking Meloxicam 15 mg daily for this. She will start back on Trimethaprim 100 mg daily for the IC. We will refer her to Urogynecology for the pelvic floor dysfunction. Get fasting labs for lipids, etc. We spent a total of ( 34  ) minutes reviewing records and discussing these issues.  Alysia Penna, MD

## 2022-08-02 NOTE — Telephone Encounter (Signed)
Patient states that she gave wrong dosage information on the Levothyroxine. She states that Dr. Sarajane Jews increased dose back in 10/22 and she has been taking the 15mg for a year not the 870m dose.

## 2022-08-02 NOTE — Telephone Encounter (Signed)
LMTCB

## 2022-08-02 NOTE — Telephone Encounter (Signed)
Patient notified and will pick up new script

## 2022-08-03 ENCOUNTER — Other Ambulatory Visit (INDEPENDENT_AMBULATORY_CARE_PROVIDER_SITE_OTHER): Payer: Medicare Other

## 2022-08-03 DIAGNOSIS — R739 Hyperglycemia, unspecified: Secondary | ICD-10-CM

## 2022-08-03 DIAGNOSIS — I10 Essential (primary) hypertension: Secondary | ICD-10-CM

## 2022-08-03 LAB — HEPATIC FUNCTION PANEL
ALT: 14 U/L (ref 0–35)
AST: 21 U/L (ref 0–37)
Albumin: 4.3 g/dL (ref 3.5–5.2)
Alkaline Phosphatase: 57 U/L (ref 39–117)
Bilirubin, Direct: 0.1 mg/dL (ref 0.0–0.3)
Total Bilirubin: 0.6 mg/dL (ref 0.2–1.2)
Total Protein: 7 g/dL (ref 6.0–8.3)

## 2022-08-03 LAB — HEMOGLOBIN A1C: Hgb A1c MFr Bld: 6.1 % (ref 4.6–6.5)

## 2022-08-03 LAB — BASIC METABOLIC PANEL
BUN: 10 mg/dL (ref 6–23)
CO2: 30 mEq/L (ref 19–32)
Calcium: 9.3 mg/dL (ref 8.4–10.5)
Chloride: 96 mEq/L (ref 96–112)
Creatinine, Ser: 0.62 mg/dL (ref 0.40–1.20)
GFR: 81.46 mL/min (ref >60.00)
Glucose, Bld: 111 mg/dL — ABNORMAL HIGH (ref 70–99)
Potassium: 4.2 mEq/L (ref 3.5–5.1)
Sodium: 132 mEq/L — ABNORMAL LOW (ref 135–145)

## 2022-08-03 LAB — LIPID PANEL
Cholesterol: 135 mg/dL (ref 0–200)
HDL: 37.6 mg/dL — ABNORMAL LOW (ref >39.00)
LDL Cholesterol: 73 mg/dL (ref 0–99)
NonHDL: 97.31
Total CHOL/HDL Ratio: 4
Triglycerides: 120 mg/dL (ref 0.0–149.0)
VLDL: 24 mg/dL (ref 0.0–40.0)

## 2022-08-03 LAB — CBC WITH DIFFERENTIAL/PLATELET
Basophils Absolute: 0 10*3/uL (ref 0.0–0.1)
Basophils Relative: 0.4 % (ref 0.0–3.0)
Eosinophils Absolute: 0.1 10*3/uL (ref 0.0–0.7)
Eosinophils Relative: 1.8 % (ref 0.0–5.0)
HCT: 38.3 % (ref 36.0–46.0)
Hemoglobin: 13.4 g/dL (ref 12.0–15.0)
Lymphocytes Relative: 29.3 % (ref 12.0–46.0)
Lymphs Abs: 2.1 10*3/uL (ref 0.7–4.0)
MCHC: 35 g/dL (ref 30.0–36.0)
MCV: 92 fl (ref 78.0–100.0)
Monocytes Absolute: 0.6 10*3/uL (ref 0.1–1.0)
Monocytes Relative: 8.5 % (ref 3.0–12.0)
Neutro Abs: 4.2 10*3/uL (ref 1.4–7.7)
Neutrophils Relative %: 60 % (ref 43.0–77.0)
Platelets: 274 10*3/uL (ref 150.0–400.0)
RBC: 4.17 Mil/uL (ref 3.87–5.11)
RDW: 12.1 % (ref 11.5–15.5)
WBC: 7.1 10*3/uL (ref 4.0–10.5)

## 2022-08-09 ENCOUNTER — Other Ambulatory Visit: Payer: Self-pay | Admitting: Family Medicine

## 2022-08-09 DIAGNOSIS — F320 Major depressive disorder, single episode, mild: Secondary | ICD-10-CM

## 2022-08-10 ENCOUNTER — Telehealth: Payer: Self-pay | Admitting: Family Medicine

## 2022-08-10 NOTE — Telephone Encounter (Signed)
Left message for patient to call back and schedule Medicare Annual Wellness Visit (AWV) either virtually or in office. Left  my Herbie Drape number [phone]   Last AWV 06/15/21 please schedule with Nurse Health Adviser   45 min for awv-i and in office appointments 30 min for awv-s  phone/virtual appointments

## 2022-08-22 ENCOUNTER — Other Ambulatory Visit: Payer: Self-pay | Admitting: Family Medicine

## 2022-08-22 DIAGNOSIS — I10 Essential (primary) hypertension: Secondary | ICD-10-CM

## 2022-09-15 ENCOUNTER — Other Ambulatory Visit: Payer: Self-pay | Admitting: Internal Medicine

## 2022-09-15 DIAGNOSIS — E890 Postprocedural hypothyroidism: Secondary | ICD-10-CM

## 2022-09-21 ENCOUNTER — Other Ambulatory Visit (INDEPENDENT_AMBULATORY_CARE_PROVIDER_SITE_OTHER): Payer: Medicare Other

## 2022-09-21 DIAGNOSIS — E890 Postprocedural hypothyroidism: Secondary | ICD-10-CM

## 2022-09-21 LAB — TSH: TSH: 0.98 u[IU]/mL (ref 0.35–5.50)

## 2022-09-22 ENCOUNTER — Encounter: Payer: Self-pay | Admitting: Internal Medicine

## 2022-09-26 ENCOUNTER — Other Ambulatory Visit: Payer: Self-pay | Admitting: Family Medicine

## 2022-09-26 DIAGNOSIS — F320 Major depressive disorder, single episode, mild: Secondary | ICD-10-CM

## 2022-09-27 ENCOUNTER — Telehealth: Payer: Self-pay

## 2022-09-27 NOTE — Telephone Encounter (Signed)
Attempted to return patient's call regarding lab results, lvm informing her they were mailed to her on 1/18.

## 2022-09-30 ENCOUNTER — Telehealth: Payer: Self-pay | Admitting: Family Medicine

## 2022-09-30 NOTE — Telephone Encounter (Signed)
Left message for patient to call back and schedule Medicare Annual Wellness Visit (AWV) either virtually or in office. Left  my [name] [name] number [phone]   Last AWV 06/15/21 please schedule with Nurse Health Adviser   45 min for awv-i  in office appointments 30 min for awv-s & awv-i phone/virtual appointments

## 2022-11-11 ENCOUNTER — Ambulatory Visit (INDEPENDENT_AMBULATORY_CARE_PROVIDER_SITE_OTHER): Payer: Medicare Other | Admitting: Obstetrics and Gynecology

## 2022-11-11 ENCOUNTER — Encounter: Payer: Self-pay | Admitting: Obstetrics and Gynecology

## 2022-11-11 ENCOUNTER — Other Ambulatory Visit (HOSPITAL_COMMUNITY)
Admission: RE | Admit: 2022-11-11 | Discharge: 2022-11-11 | Disposition: A | Discharge status: Home / Self Care | Payer: Medicare Other | Attending: Obstetrics and Gynecology | Admitting: Obstetrics and Gynecology

## 2022-11-11 VITALS — BP 130/80 | HR 66 | Ht 63.0 in | Wt 150.0 lb

## 2022-11-11 DIAGNOSIS — R339 Retention of urine, unspecified: Secondary | ICD-10-CM

## 2022-11-11 DIAGNOSIS — R350 Frequency of micturition: Secondary | ICD-10-CM | POA: Diagnosis not present

## 2022-11-11 DIAGNOSIS — N8110 Cystocele, unspecified: Secondary | ICD-10-CM | POA: Diagnosis not present

## 2022-11-11 DIAGNOSIS — R82998 Other abnormal findings in urine: Secondary | ICD-10-CM | POA: Insufficient documentation

## 2022-11-11 LAB — POCT URINALYSIS DIPSTICK
Bilirubin, UA: NEGATIVE
Glucose, UA: NEGATIVE
Ketones, UA: NEGATIVE
Nitrite, UA: NEGATIVE
Protein, UA: NEGATIVE
Spec Grav, UA: 1.015 (ref 1.010–1.025)
Urobilinogen, UA: 0.2 E.U./dL
pH, UA: 6.5 (ref 5.0–8.0)

## 2022-11-11 NOTE — Progress Notes (Unsigned)
[facility] New Patient Evaluation and Consultation  Referring Provider: [name], MD PCP: [name], MD Date of Service: 11/11/2022  SUBJECTIVE Chief Complaint: New Patient (Initial Visit) [name] [name] is a 88 y.o. female here for a consult. Pt said "she has a delayed stream")  History of Present Illness: [name] [name] is a 88 y.o. White or Caucasian female seen in consultation at the request of Dr. Sarajane Jews for evaluation of pelvic floor dysfunction. .    Review of records significant for: Has a history of interstitial cystitis which causes frequent burning with urination. She has been on trimethoprim daily. Has been told her has a dropped bladder.   Urinary Symptoms: Does not leak urine.   Day time voids every few hours.  Nocturia: 3-8 times per night to void. She feels urine stream is minimal throughout the day and has hesitancy.  But then at night after she lays down, she can urinate better and will need to get up several times through the night to empty her bladder.  Voiding dysfunction: she does not empty her bladder well.  does not use a catheter to empty bladder.  When urinating, she feels a weak stream and difficulty starting urine stream   UTIs: 1 UTI's in the last year.   Denies history of blood in urine and kidney or bladder stones Used to have "stretching" of the urethra. She also had surgery to remove "scar tissue" from the bladder neck.  She is taking trimethoprim daily  Pelvic Organ Prolapse Symptoms:                  She Denies a feeling of a bulge the vaginal area. She reports that she was told that she had something falling in the past. But she is not sure she feels or sees anything there.   Bowel Symptom: Bowel movements: every day Stool consistency: soft  or loose Straining: no.  Splinting: no.  Incomplete evacuation: no.  She Admits to accidental bowel leakage / fecal incontinence  Occurs: very rarely  Consistency with  leakage: soft  Bowel regimen: fiber  Sexual Function Sexually active: no.   Pelvic Pain Admits to pelvic pain Location: irritation with voiding Prior pain treatment: low dose antibiotic Improved by: daily antibiotics    Past Medical History:  Past Medical History:  Diagnosis Date   Allergic angioedema    from shellfish and tree nuts   Chest pain 2000 and 2001   negative cardiac workup   Complication of anesthesia    Diverticulosis    Fibrocystic breast disease    Hyperlipidemia    Hypertension    Irritable bowel syndrome    Perirectal cyst    ?lymphocoele   PMR (polymyalgia rheumatica) ([place]) 2011   PONV (postoperative nausea and vomiting)    Seborrheic dermatitis    Squamous cell skin cancer    X1, [facility]   Thyroid disease    post op hypothyroidism; TSH goal would be sup;pressive     Past Surgical History:   Past Surgical History:  Procedure Laterality Date   BILIARY DILATION  04/23/2021   Procedure: BILIARY DILATION;  Surgeon: Jackquline Denmark, MD;  Location: [facility];  Service: Endoscopy;;   CHOLECYSTECTOMY N/A 04/24/2021   Procedure: LAPAROSCOPIC CHOLECYSTECTOMY;  Surgeon: Ileana Roup, MD;  Location: [facility] ORS;  Service: General;  Laterality: N/A;  90   COLONOSCOPY  2004   per Dr. Olevia Perches, clear but incomplete; Barium study added, repeat in 10 yrs  DILATION AND CURETTAGE OF UTERUS     ERCP N/A 04/23/2021   Procedure: ENDOSCOPIC RETROGRADE CHOLANGIOPANCREATOGRAPHY (ERCP);  Surgeon: Jackquline Denmark, MD;  Location: [facility];  Service: Endoscopy;  Laterality: N/A;   ESOPHAGOGASTRODUODENOSCOPY (EGD) WITH PROPOFOL N/A 07/21/2018   Procedure: ESOPHAGOGASTRODUODENOSCOPY (EGD) WITH PROPOFOL;  Surgeon: Jackquline Denmark, MD;  Location: [facility];  Service: Endoscopy;  Laterality: N/A;   ESOPHAGOGASTRODUODENOSCOPY (EGD) WITH PROPOFOL N/A 04/23/2021   Procedure: ESOPHAGOGASTRODUODENOSCOPY (EGD) WITH PROPOFOL;  Surgeon: Jackquline Denmark, MD;  Location: [facility];   Service: Endoscopy;  Laterality: N/A;   exporatory surgery for rectal mass  1986   peri rectal cyst   FOREIGN BODY REMOVAL  07/21/2018   Procedure: FOREIGN BODY REMOVAL;  Surgeon: Jackquline Denmark, MD;  Location: [facility];  Service: Endoscopy;;   REMOVAL OF STONES  04/23/2021   Procedure: REMOVAL OF STONES;  Surgeon: Jackquline Denmark, MD;  Location: [facility];  Service: Endoscopy;;   SPHINCTEROTOMY  04/23/2021   Procedure: Joan Mayans;  Surgeon: Jackquline Denmark, MD;  Location: [facility];  Service: Endoscopy;;   THYROIDECTOMY  1988   S/P radiation previously seen by Liliane Bade, MD @ [facility], now followed by Dr. Leonides Schanz   TONSILLECTOMY  1972     Past OB/GYN History: OB History  Gravida Para Term Preterm AB Living  '2 2 2     1  '$ SAB IAB Ectopic Multiple Live Births          2    # Outcome Date GA Lbr Len/2nd Weight Sex Delivery Anes PTL Lv  2 Term      Vag-Spont     1 Term      Vag-Spont       Menopausal: Denies vaginal bleeding since menopause  Any history of abnormal pap smears: no.   Medications: She has a current medication list which includes the following prescription(s): calcium carbonate, cholecalciferol, citalopram, diphenhydramine, epinephrine, levothyroxine, meloxicam, metoprolol tartrate, pravastatin, spironolactone, and trimethoprim.   Allergies: Patient is allergic to horse-derived products, other, penicillins, shellfish allergy, shellfish-derived products, bacitracin-polymyxin b, egg-derived products, and neomycin-bacitracin zn-polymyx.   Social History:  Social History   Tobacco Use   Smoking status: Never   Smokeless tobacco: Never  Vaping Use   Vaping Use: Never used  Substance Use Topics   Alcohol use: Yes    Alcohol/week: 0.0 standard drinks of alcohol    Comment: very rarely   Drug use: No    Relationship status: divorced She lives alone She is not employed. Regular exercise: No History of abuse: No  Family History:   Family History   Problem Relation Age of Onset   Hypertension Mother    Prostate cancer Father    Parkinsonism Brother    Heart attack Paternal Uncle 102   Breast cancer Maternal Grandmother    Hypertension Maternal Grandmother    Heart attack Paternal Grandfather 44     Review of Systems: Review of Systems  Constitutional:  Negative for fever, malaise/fatigue and weight loss.  Respiratory:  Negative for cough, shortness of breath and wheezing.   Cardiovascular:  Negative for chest pain, palpitations and leg swelling.  Gastrointestinal:  Negative for abdominal pain and blood in stool.  Genitourinary:  Positive for dysuria.  Musculoskeletal:  Negative for myalgias.  Skin:  Negative for rash.  Neurological:  Negative for dizziness and headaches.  Endo/Heme/Allergies:  Does not bruise/bleed easily.  Psychiatric/Behavioral:  Negative for depression. The patient is not nervous/anxious.      OBJECTIVE Physical Exam: Vitals:  11/11/22 1416  BP: 130/80  Pulse: 66  Weight: 150 lb (68 kg)  Height: '5\' 3"'$  (1.6 m)    Physical Exam Constitutional:      General: She is not in acute distress. Pulmonary:     Effort: Pulmonary effort is normal.  Abdominal:     General: There is no distension.     Palpations: Abdomen is soft.     Tenderness: There is no abdominal tenderness. There is no rebound.  Musculoskeletal:        General: No swelling. Normal range of motion.  Skin:    General: Skin is warm and dry.     Findings: No rash.  Neurological:     Mental Status: She is alert and oriented to person, place, and time.  Psychiatric:        Mood and Affect: Mood normal.        Behavior: Behavior normal.      GU / Detailed Urogynecologic Evaluation:  Pelvic Exam: Normal external female genitalia; Bartholin's and Skene's glands normal in appearance; urethral meatus normal in appearance, no urethral masses or discharge.   CST: negative  Speculum exam reveals normal vaginal mucosa with atrophy.  Cervix normal appearance. Uterus normal single, nontender. Adnexa no mass, fullness, tenderness.     Pelvic floor strength I/V  Pelvic floor musculature: Right levator non-tender, Right obturator non-tender, Left levator non-tender, Left obturator non-tender  POP-Q:   POP-Q  0                                            Aa   0                                           Ba  -8.5                                              C   2                                            Gh  4                                            Pb  9                                            tvl   -2                                            Ap  -2  Bp  -8                                              D      Rectal Exam:  Normal external rectum  Post-Void Residual (PVR) by Bladder Scan: In order to evaluate bladder emptying, we discussed obtaining a postvoid residual and she agreed to this procedure.  Procedure: The ultrasound unit was placed on the patient's abdomen in the suprapubic region after the patient had voided. A PVR of 268 ml was obtained by bladder scan.  Catheter placed during the exam and urine drained- consistent with bladder scan  Laboratory Results: POC urine: negative  ASSESSMENT AND PLAN Ms. Pezza is a 88 y.o. with:  1. Incomplete bladder emptying   2. Urinary frequency   3. Leukocytes in urine   4. Prolapse of anterior vaginal wall     Incomplete emptying - will have her undergo urodynamic testing to further evaluate emptying   2. Stage II anterior, Stage I posterior, Stage 0 apical prolapse - asymptomatic from POP but may be contributing to urinary retention symptoms.  - Will reevaluate after urodynamic testing and will further discuss treatment with pessary vs surgery if it would be beneficial for bladder emptying.   3. Leukocytes in urine - urine for culture - No documented UTIs in the last year in epic so unclear  etiology for daily trimethoprim, however will continue for now in anticipation of urodynamic testing.   Return for urodynamics  Jaquita Folds, MD

## 2022-11-11 NOTE — Patient Instructions (Signed)

## 2022-11-12 LAB — URINE CULTURE

## 2022-11-15 ENCOUNTER — Encounter: Payer: Self-pay | Admitting: Obstetrics and Gynecology

## 2022-11-21 NOTE — Progress Notes (Unsigned)
[facility] Urodynamics Procedure  Referring Physician: [name], MD Date of Procedure: 11/22/2022  [name] [name] is a 88 y.o. female who presents for urodynamic evaluation. Indication(s) for study: {UDS indications:24784}  Vital Signs: There were no vitals taken for this visit.  Laboratory Results: A {clean catch/ QF:040223 urine specimen revealed:  POC urine:    Voiding Diary: The patient voided {NUMBERS; 1-20:18551} times per day. Voided volumes ranged from *** mL to *** mL, with an average of *** mL.  Intervals between voids ranged from *** hr to ***hr with an average interval between voids of *** hr.  Intake per day ranged from ***mL to ***mL.  Output ranged from ***mL to ***mL.  She had *** incontinence episodes per day (*** UUI, *** SUI) and *** episodes of nocturia.  She drinks: *** oz of caffeinated beverages, *** oz of juice, and *** oz of alcohol per day.  Procedure Timeout:  The correct patient was verified and the correct procedure was verified. The patient was in the correct position and safety precautions were reviewed based on at the patient's history.  Urodynamic Procedure A 66F dual lumen urodynamics catheter was placed under sterile conditions into the patient's bladder. A 66F catheter was placed into the {vagina/ rectum:24786} in order to measure abdominal pressure. EMG patches were placed in the appropriate position.  All connections were confirmed and calibrations/adjusted made. Saline was instilled into the bladder through the dual lumen catheters.  Cough/valsalva pressures were measured periodically during filling.  Patient was allowed to void.  The bladder was then emptied of its residual.  UROFLOW: Revealed a Qmax of *** mL/sec.  She voided *** mL and had a residual of *** mL.  It was a {uroflow:24789} pattern and represented normal habits ***though interpretation limited due to low voided volume.***  CMG: This was performed with sterile  water in the sitting position at a fill rate of 30*** mL/min.    First sensation of fullness was *** mLs,  First urge was *** mLs,  Strong urge was *** mLs and  Capacity was *** mLs  Stress incontinence {WAS/WAS NOT:[phone]::"was not"} demonstrated {highest/ lowest:24787} {Desc; negative/positive:13464} CLPP was *** cmH20 at *** ml. {highest/ lowest:24787} {Desc; negative/positive:13464} VLPP was *** cmH20at *** ml. {highest/ lowest:24787} {Desc; negative/positive:13464} Barrier CLPP was *** cmH20 at *** ml. {highest/ lowest:24787} {Desc; negative/positive:13464} Barrier VLPP was *** cmH20 at *** ml.  Detrusor function was {Desc; normal/underactive/overactive:13463}, {With/no:32556} phasic contractions seen.  The first occurred at *** mL to *** cm of water and {WAS/WAS NOT:[phone]::"was not"} associated with {urge/ LQ:1409369.  Compliance:  ***. End fill detrusor pressure was ***cmH20.  Calculated compliance was ***IV:[id number]  UPP: MUCP {With/without:5700} barrier reduction was *** cm of water.    MICTURITION STUDY: Voiding was performed {reduction:24791} in the sitting position.  Pdet at Qmax was *** cm of water.  Qmax was *** mL/sec.  It was a {uroflow:24789} pattern.  She voided *** mL and had a residual of *** mL.  It was a volitional void, sustained detrusor contraction was {DESC; PRESENT/NOT PRESENT:21021351} and abdominal straining was {DESC; PRESENT/NOT PRESENT:21021351}  EMG: This was performed with patches.  She had voluntary contractions, recruitment with fill was {DESC; PRESENT/NOT PRESENT:21021351} and urethral sphincter was {relaxed:24792} with void.  The details of the procedure with the study tracings have been scanned into EPIC.   Urodynamic Impression:  1. Sensation was {DESC; NORMAL/REDUCED/ABSENT/INCREASED:23133}; capacity was {DESC; NORMAL/REDUCED/ABSENT/INCREASED:23133} 2. Stress Incontinence {WAS/WAS NOT:[phone]::"was not"} demonstrated at {ISD:24793}  pressures; 3.  Detrusor Overactivity {WAS/WAS [phone]::"was not"} demonstrated {With-without:32421} leakage. 4. Emptying was {dysfunctional:24794} with a {elevated:24795} PVR, a sustained detrusor contraction {DESC; PRESENT/NOT PRESENT:21021351},  abdominal straining {DESC; PRESENT/NOT PRESENT:21021351}, {dyssynergic:24796} urethral sphincter activity on EMG.  Plan: - The patient will follow up  to discuss the findings and treatment options.

## 2022-11-22 ENCOUNTER — Other Ambulatory Visit (HOSPITAL_COMMUNITY)
Admission: RE | Admit: 2022-11-22 | Discharge: 2022-11-22 | Disposition: A | Discharge status: Home / Self Care | Payer: Medicare Other | Source: Ambulatory Visit | Attending: Obstetrics and Gynecology | Admitting: Obstetrics and Gynecology

## 2022-11-22 ENCOUNTER — Encounter: Payer: Self-pay | Admitting: Obstetrics and Gynecology

## 2022-11-22 ENCOUNTER — Telehealth: Payer: Self-pay | Admitting: Family Medicine

## 2022-11-22 ENCOUNTER — Ambulatory Visit: Payer: Medicare Other | Admitting: Obstetrics and Gynecology

## 2022-11-22 VITALS — BP 133/58 | HR 62

## 2022-11-22 DIAGNOSIS — R82998 Other abnormal findings in urine: Secondary | ICD-10-CM

## 2022-11-22 DIAGNOSIS — R350 Frequency of micturition: Secondary | ICD-10-CM | POA: Diagnosis not present

## 2022-11-22 DIAGNOSIS — R339 Retention of urine, unspecified: Secondary | ICD-10-CM | POA: Diagnosis not present

## 2022-11-22 DIAGNOSIS — R319 Hematuria, unspecified: Secondary | ICD-10-CM

## 2022-11-22 LAB — URINALYSIS, ROUTINE W REFLEX MICROSCOPIC
Bilirubin Urine: NEGATIVE
Glucose, UA: NEGATIVE mg/dL
Ketones, ur: NEGATIVE mg/dL
Nitrite: NEGATIVE
Protein, ur: 30 mg/dL — AB
Specific Gravity, Urine: 1.008 (ref 1.005–1.030)
WBC, UA: >50 WBC/hpf (ref 0–5)
pH: 6 (ref 5.0–8.0)

## 2022-11-22 LAB — POCT URINALYSIS DIPSTICK
Bilirubin, UA: NEGATIVE
Glucose, UA: NEGATIVE
Ketones, UA: NEGATIVE
Nitrite, UA: NEGATIVE
Protein, UA: NEGATIVE
Spec Grav, UA: 1.015 (ref 1.010–1.025)
Urobilinogen, UA: 0.2 E.U./dL
pH, UA: 7 (ref 5.0–8.0)

## 2022-11-22 MED ORDER — PHENAZOPYRIDINE HCL 95 MG PO TABS: 95.0000 mg | ORAL_TABLET | Freq: Three times a day (TID) | ORAL | 1 refills | Status: DC | PRN

## 2022-11-22 NOTE — Patient Instructions (Signed)

## 2022-11-22 NOTE — Telephone Encounter (Signed)
Called patient to schedule Medicare Annual Wellness Visit (AWV). Left message for patient to call back and schedule Medicare Annual Wellness Visit (AWV).  Last date of AWV: 06/15/21  Please schedule an appointment at any time with [facility] or Colin Benton   .  If any questions, please contact me at [phone].  Thank you ,  Barkley Boards AWV direct phone # [phone]

## 2022-11-23 ENCOUNTER — Telehealth: Payer: Self-pay | Admitting: Obstetrics and Gynecology

## 2022-11-23 LAB — URINE CULTURE: Culture: NO GROWTH

## 2022-11-23 NOTE — Telephone Encounter (Signed)
Called and spoke to patient regarding results. She is aware there is no growth and no sign of UTI. Encouraged her to take pyridium for her burning. She reports understanding.

## 2022-12-14 ENCOUNTER — Encounter: Payer: Self-pay | Admitting: Obstetrics and Gynecology

## 2022-12-14 ENCOUNTER — Ambulatory Visit: Payer: Medicare Other | Admitting: Obstetrics and Gynecology

## 2022-12-14 VITALS — BP 135/76 | HR 65

## 2022-12-14 DIAGNOSIS — N310 Uninhibited neuropathic bladder, not elsewhere classified: Secondary | ICD-10-CM

## 2022-12-14 DIAGNOSIS — N8110 Cystocele, unspecified: Secondary | ICD-10-CM | POA: Diagnosis not present

## 2022-12-14 DIAGNOSIS — R339 Retention of urine, unspecified: Secondary | ICD-10-CM

## 2022-12-14 NOTE — Progress Notes (Signed)
[facility] Return Visit  SUBJECTIVE  History of Present Illness: [name] [name] is a 88 y.o. female seen in follow-up for incomplete bladder emptying. She underwent urodynamic testing.  Urodynamic Impression:  1. Sensation was normal; capacity was normal 2. Stress Incontinence was not demonstrated at normal pressures; 3. Detrusor Overactivity was not demonstrated. 4. Emptying was dysfunctional with a elevated PVR ( ), a sustained detrusor contraction present,  abdominal straining present, dyssynergic urethral sphincter activity on EMG.   Had BMP 07/2022 which showed normal renal function. Feels that she is able to empty better in certain positions.Has to really try to relax on the toilet.   Past Medical History: Patient  has a past medical history of Allergic angioedema, Chest pain (2000 and 2001), Complication of anesthesia, Diverticulosis, Fibrocystic breast disease, Hyperlipidemia, Hypertension, Irritable bowel syndrome, Perirectal cyst, PMR (polymyalgia rheumatica) (2011), PONV (postoperative nausea and vomiting), Seborrheic dermatitis, Squamous cell skin cancer, and Thyroid disease.   Past Surgical History: She  has a past surgical history that includes Thyroidectomy (1988); Dilation and curettage of uterus; Tonsillectomy (1972); exporatory surgery for rectal mass (1986); Colonoscopy (2004); Esophagogastroduodenoscopy (egd) with propofol (N/A, 07/21/2018); Foreign Body Removal (07/21/2018); Cholecystectomy (N/A, 04/24/2021); ERCP (N/A, 04/23/2021); sphincterotomy (04/23/2021); Biliary dilation (04/23/2021); removal of stones (04/23/2021); and Esophagogastroduodenoscopy (egd) with propofol (N/A, 04/23/2021).   Medications: She has a current medication list which includes the following prescription(s): calcium carbonate, cholecalciferol, citalopram, diphenhydramine, epinephrine, levothyroxine, meloxicam, metoprolol tartrate, phenazopyridine, pravastatin, spironolactone, and  trimethoprim.   Allergies: Patient is allergic to horse-derived products, other, penicillins, shellfish allergy, shellfish-derived products, bacitracin-polymyxin b, egg-derived products, and neomycin-bacitracin zn-polymyx.   Social History: Patient  reports that she has never smoked. She has never used smokeless tobacco. She reports current alcohol use. She reports that she does not use drugs.      OBJECTIVE     Physical Exam: Vitals:   12/14/22 1239  BP: 135/76  Pulse: 65   Gen: No apparent distress, A&O x 3.  Detailed Urogynecologic Evaluation:  Normal external genitalia. Placed #2 ring with support pessary. This dropped down with walking around. Pessary removed and #3 ring with support pessary placed. It was comfortable, fit well, and stayed in placed with standing, valsalva and bending. Lot # [id number]   Urethra was prepped and straight cath placed- urine was obtained (pt has not voided in some time).    Prior exam showed:  POP-Q   0                                            Aa   0                                           Ba   -8.5                                              C    2  Gh   4                                            Pb   9                                            tvl    -2                                            Ap   -2                                            Bp   -8                                              D        ASSESSMENT AND PLAN    [name] is a 88 y.o. with:  1. Incomplete bladder emptying   2. Uninhibited neuropathic bladder, not elsewhere classified   3. Prolapse of anterior vaginal wall    - We discussed that UDS showed some bladder contraction, but still does not empty well. Detrusor function was intermittent. We discussed trying a pessary today to see if this will help her empty bladder.  - Fit with a #3 ring with support pessary. Will check the pessary in a  few weeks and recheck PVR at that time.  - Has had normal renal function but will also obtain Renal US to assess for hydronephrosis. If no hydroneprhosis, can potentially monitor the incomplete emptying. Discussed self cathing with patient but she does not feel she will be able to do this.  - We discussed voiding with feet supported on a stool to help relax pelvic floor. She should also try to take some deep breaths to relax and double void. Can also consider pelvic PT.   Return 2-3 weeks  Marguerita Beards, MD

## 2022-12-23 ENCOUNTER — Telehealth: Payer: Self-pay | Admitting: Family Medicine

## 2022-12-23 ENCOUNTER — Encounter: Payer: Self-pay | Admitting: Internal Medicine

## 2022-12-23 ENCOUNTER — Ambulatory Visit (INDEPENDENT_AMBULATORY_CARE_PROVIDER_SITE_OTHER): Payer: Medicare Other | Admitting: Internal Medicine

## 2022-12-23 VITALS — BP 124/78 | HR 70 | Ht 63.0 in | Wt 148.0 lb

## 2022-12-23 DIAGNOSIS — C73 Malignant neoplasm of thyroid gland: Secondary | ICD-10-CM | POA: Diagnosis not present

## 2022-12-23 DIAGNOSIS — E890 Postprocedural hypothyroidism: Secondary | ICD-10-CM

## 2022-12-23 DIAGNOSIS — R339 Retention of urine, unspecified: Secondary | ICD-10-CM | POA: Insufficient documentation

## 2022-12-23 LAB — TSH: TSH: 0.05 u[IU]/mL — ABNORMAL LOW (ref 0.35–5.50)

## 2022-12-23 NOTE — Progress Notes (Unsigned)
Name: [name] [name]  MRN/ DOB: [id number], [birth year]    Age/ Sex: 88 y.o., female    PCP: Nelwyn Salisbury, MD   Reason for Endocrinology Evaluation: Hypothyroidism     Date of Initial Endocrinology Evaluation: 07/22/2022    HPI: [name] [name] is a 88 y.o. female with a past medical history of Htn, Dyslipidemia and RA. The patient presented for initial endocrinology clinic visit on 07/22/2022 for consultative assistance with her Hypothyroidism.   She is S/P total thyroidectomy 08/1987 due to Follicular thyroid carcinoma. She is S/P RAI ablation 1989    Her last thyroid ultrasound was in 2014 with NO evidence of recurrence   TG and TG antibodies were  negative -07/2022  SUBJECTIVE:    Today (12/23/22):  [name] [name] is here for follow-up on post operative hypothyroid   Weight continues to fluctuate  She is having insomnia , that she attributes to levothyroxine  Continues with changes in BM , that she attributes to IBS She denies local neck swelling   She follows with urology due to incomplete emptying of the bladder  Levothyroxine 125 mcg daily   HISTORY:  Past Medical History:  Past Medical History:  Diagnosis Date   Allergic angioedema    from shellfish and tree nuts   Chest pain 2000 and 2001   negative cardiac workup   Complication of anesthesia    Diverticulosis    Fibrocystic breast disease    Hyperlipidemia    Hypertension    Irritable bowel syndrome    Perirectal cyst    ?lymphocoele   PMR (polymyalgia rheumatica) 2011   PONV (postoperative nausea and vomiting)    Seborrheic dermatitis    Squamous cell skin cancer    X1, [facility]   Thyroid disease    post op hypothyroidism; TSH goal would be sup;pressive   Past Surgical History:  Past Surgical History:  Procedure Laterality Date   BILIARY DILATION  04/23/2021   Procedure: BILIARY DILATION;  Surgeon: Lynann Bologna, MD;  Location: [facility];  Service: Endoscopy;;    CHOLECYSTECTOMY N/A 04/24/2021   Procedure: LAPAROSCOPIC CHOLECYSTECTOMY;  Surgeon: Andria Meuse, MD;  Location: [facility];  Service: General;  Laterality: N/A;  90   COLONOSCOPY  2004   per Dr. Juanda Chance, clear but incomplete; Barium study added, repeat in 10 yrs    DILATION AND CURETTAGE OF UTERUS     ERCP N/A 04/23/2021   Procedure: ENDOSCOPIC RETROGRADE CHOLANGIOPANCREATOGRAPHY (ERCP);  Surgeon: Lynann Bologna, MD;  Location: [facility];  Service: Endoscopy;  Laterality: N/A;   ESOPHAGOGASTRODUODENOSCOPY (EGD) WITH PROPOFOL N/A 07/21/2018   Procedure: ESOPHAGOGASTRODUODENOSCOPY (EGD) WITH PROPOFOL;  Surgeon: Lynann Bologna, MD;  [facility];  Service: Endoscopy;  Laterality: N/A;   ESOPHAGOGASTRODUODENOSCOPY (EGD) WITH PROPOFOL N/A 04/23/2021   Procedure: ESOPHAGOGASTRODUODENOSCOPY (EGD) WITH PROPOFOL;  Surgeon: Lynann Bologna, MD;  [facility];  Service: Endoscopy;  Laterality: N/A;   exporatory surgery for rectal mass  1986   peri rectal cyst   FOREIGN BODY REMOVAL  07/21/2018   Procedure: FOREIGN BODY REMOVAL;  Surgeon: Lynann Bologna, MD;  [facility];  Service: Endoscopy;;   REMOVAL OF STONES  04/23/2021   Procedure: REMOVAL OF STONES;  Surgeon: Lynann Bologna, MD;  [facility];  Service: Endoscopy;;   SPHINCTEROTOMY  04/23/2021   Procedure: Dennison Mascot;  Surgeon: Lynann Bologna, MD;  [facility];  Service: Endoscopy;;   THYROIDECTOMY  1988   S/P radiation previously seen by Levonne Hubert,  MD @ [facility], now followed by Dr. Uvaldo Rising   TONSILLECTOMY  1972    Social History:  reports that she has never smoked. She has never used smokeless tobacco. She reports current alcohol use. She reports that she does not use drugs. Family History: family history includes Breast cancer in her maternal grandmother; Heart attack (age of onset: 73) in her paternal uncle; Heart attack (age of onset: 59) in her paternal grandfather; Hypertension in  her maternal grandmother and mother; Parkinsonism in her brother; Prostate cancer in her father.   HOME MEDICATIONS: Allergies as of 12/23/2022       Reactions   Horse-derived Products    Redness & swelling of arm   Other Anaphylaxis   Tree nuts (not peanuts)  Other reaction(s): Other (See Comments) Uncoded Allergy. Allergen: Other Reaction: red and swelling   Penicillins    Rash 04/20/2021 tolerated Zosyn   Shellfish Allergy Other (See Comments)   Other Reaction: throat closes up   Shellfish-derived Products    Throat swelling   Bacitracin-polymyxin B    Local swelling   Egg-derived Products    REACTION: Unable to get Flu Vaccine Skin Tests+ for egg products   Neomycin-bacitracin Zn-polymyx         Medication List        Accurate as of December 23, 2022  1:48 PM. If you have any questions, ask your nurse or doctor.          CALCIUM 500 PO Take 1 tablet by mouth daily at 6 (six) AM.   citalopram 20 MG tablet Commonly known as: CELEXA Take 0.5 tablets (10 mg total) by mouth daily.   diphenhydrAMINE 25 mg capsule Commonly known as: BENADRYL Take 25 mg by mouth as needed.   EPINEPHrine 0.3 mg/0.3 mL Soaj injection Commonly known as: EPI-PEN Inject 0.3 mg into the muscle as needed for anaphylaxis.   levothyroxine 125 MCG tablet Commonly known as: SYNTHROID Take 1 tablet (125 mcg total) by mouth daily.   meloxicam 15 MG tablet Commonly known as: MOBIC Take 1 tablet (15 mg total) by mouth daily.   metoprolol tartrate 100 MG tablet Commonly known as: LOPRESSOR TAKE 1 TABLET BY MOUTH EVERY MORNING AND 1/2 TABLET BY MOUTH EVERY EVENING   phenazopyridine 95 MG tablet Commonly known as: PYRIDIUM Take 1 tablet (95 mg total) by mouth 3 (three) times daily as needed for pain.   pravastatin 40 MG tablet Commonly known as: PRAVACHOL TAKE 1 TABLET(40 MG) BY MOUTH DAILY   spironolactone 25 MG tablet Commonly known as: ALDACTONE Take 1 tablet (25 mg total) by  mouth daily.   trimethoprim 100 MG tablet Commonly known as: TRIMPEX Take 1 tablet (100 mg total) by mouth daily.   VITAMIN D3 GUMMIES PO Take 1 tablet by mouth daily at 6 (six) AM.          REVIEW OF SYSTEMS: A comprehensive ROS was conducted with the patient and is negative except as per HPI   OBJECTIVE:  VS: BP 124/78 (BP Location: Left Arm, Patient Position: Sitting, Cuff Size: Large)   Pulse 70   Ht 5\' 3"  (1.6 m)   Wt 148 lb (67.1 kg)   SpO2 96%   BMI 26.22 kg/m    Wt Readings from Last 3 Encounters:  12/23/22 148 lb (67.1 kg)  11/11/22 150 lb (68 kg)  08/02/22 151 lb (68.5 kg)     EXAM: General: Pt appears well and is in NAD  Neck: General: Supple  without adenopathy. Thyroid: No goiter or nodules appreciated.  Lungs: Clear with good BS bilat   Heart: Auscultation: RRR.  Extremities:  BL LE: No pretibial edema normal ROM and strength.  Mental Status: Judgment, insight: Intact Orientation: Oriented to time, place, and person Mood and affect: No depression, anxiety, or agitation     DATA REVIEWED:   Latest Reference Range & Units 12/23/22 14:00  TSH 0.35 - 5.50 uIU/mL 0.05 (L)  (L): Data is abnormally low   Latest Reference Range & Units 07/22/22 13:50  Thyroglobulin ng/mL 0.1 (L)  Thyroglobulin Ab < or = 1 IU/mL <1     ASSESSMENT/PLAN/RECOMMENDATIONS:   Postoperative Hypothyroidism:  -Patient attributes insomnia due to increase levothyroxine from 100 mcg to 125 mcg -Patient would like to reduce levothyroxine if there is room for that - TSH continues to be low -She has been noted with drastic changes in her TSH with variable levothyroxine dose, I question either imperfect adherence to levothyroxine versus biotin intake -Patient will be asked to check for biotin intake -Will decrease levothyroxine as below  Medications : Stop levothyroxine 125 mcg daily  Start levothyroxine 112 mcg daily    2. Hx of Follicular thyroid carcinoma  - She is  decades  out from total thyroidectomy  - S/P RAI ablation in 1989 - Last ultrasound in 2014 showed no evidence of recurrence  - Tg , Tg Ab 's undetectable  07/2022     F/U in 6 months    Signed electronically by: Lyndle Herrlich, MD  [facility]  [facility] [address] [place], [place] [address] Phone: [phone] FAX: [phone]   CC: Nelwyn Salisbury, MD [address] [place] [address] Phone: [phone] Fax: [phone]   Return to Endocrinology clinic as below: Future Appointments  Date Time Provider Department Center  12/29/2022 12:40 PM Selmer Dominion, NP [facility]

## 2022-12-23 NOTE — Telephone Encounter (Signed)
She has ben seeing Dr. Lanetta Inch at [facility] urogynecology for incomplete bladder emptying. She has had a number of tests and has tied a pessary, but she is frustrated in that nothing has helped her feel better. She has pelvic pain throughout the day and she has a great difficulty passing urine during the day. She seems to pass urine more easily at night. She asks to see Dr. Gaspar Garbe for a second opinion.

## 2022-12-23 NOTE — Patient Instructions (Signed)

## 2022-12-26 ENCOUNTER — Telehealth: Payer: Self-pay | Admitting: Internal Medicine

## 2022-12-26 MED ORDER — LEVOTHYROXINE SODIUM 112 MCG PO TABS
112.0000 ug | ORAL_TABLET | Freq: Every day | ORAL | 3 refills | Status: DC
Start: 2022-12-26 — End: 2023-06-29

## 2022-12-26 NOTE — Telephone Encounter (Signed)
Patient advised and will pick up new dose  

## 2022-12-26 NOTE — Telephone Encounter (Signed)
Please let the patient know that the labs show that she is on too much levothyroxine   Stop levothyroxine 125 mcg  Start levothyroxine 112 mcg daily  Please make sure the patient is not on any biotin products as this interferes with thyroid testing   Thanks

## 2022-12-26 NOTE — Telephone Encounter (Signed)
LMTCB

## 2022-12-27 ENCOUNTER — Telehealth: Payer: Self-pay | Admitting: Obstetrics and Gynecology

## 2022-12-27 DIAGNOSIS — R339 Retention of urine, unspecified: Secondary | ICD-10-CM

## 2022-12-27 DIAGNOSIS — N8110 Cystocele, unspecified: Secondary | ICD-10-CM

## 2022-12-27 NOTE — Telephone Encounter (Signed)
Patient called today saying she wanted her follow up appt cancelled and said that the pessary came out a few days after it was put in.  She does not desire going with a pessary anymore.  Read the notes from last visit.  Told her Pelvic PT was an option.  She's open to scheduling that.  She does live near the [place] location

## 2022-12-28 NOTE — Telephone Encounter (Signed)
Pt has renal US scheduled for 4/29. She needs a follow up after this. I will place a referral for pelvic PT as well.

## 2022-12-29 ENCOUNTER — Ambulatory Visit: Payer: PRIVATE HEALTH INSURANCE | Admitting: Obstetrics and Gynecology

## 2023-01-02 ENCOUNTER — Ambulatory Visit (HOSPITAL_BASED_OUTPATIENT_CLINIC_OR_DEPARTMENT_OTHER)
Admission: RE | Admit: 2023-01-02 | Discharge: 2023-01-02 | Disposition: A | Discharge status: Home / Self Care | Payer: Medicare Other | Source: Ambulatory Visit | Attending: Obstetrics and Gynecology | Admitting: Obstetrics and Gynecology

## 2023-01-02 DIAGNOSIS — N310 Uninhibited neuropathic bladder, not elsewhere classified: Secondary | ICD-10-CM | POA: Diagnosis not present

## 2023-01-02 DIAGNOSIS — R339 Retention of urine, unspecified: Secondary | ICD-10-CM | POA: Diagnosis not present

## 2023-01-06 NOTE — Telephone Encounter (Signed)
Called patient to review results of renal US. Only received VM and left message that [place] was normal. She will need follow up with [place] for her incomplete emptying in about 6 months.

## 2023-01-09 ENCOUNTER — Telehealth: Payer: Self-pay | Admitting: Obstetrics and Gynecology

## 2023-01-09 NOTE — Telephone Encounter (Signed)
Patient called to request the results of her [place].   We discussed that the Ultrasound shows that she has retention but her kidneys have no hydronephrosis and at this time we can just plan for surveillance. Other options include her completing self cath and doing a urostomy tube if needed. At this time there is no sign there is a need for surgical intervention. She reports her pessary she had came out and it was uncomfortable and she would not like another one at this time. I encouraged her to do pelvic floor PT that  Dr. Florian Buff had recommended and she reports she will consider this. She reports she will also consider another pessary but is unsure if she wants this.   She wants her results from her [place] mailed to her and her Urodynamics impression mailed to her as well.

## 2023-01-24 ENCOUNTER — Ambulatory Visit: Payer: Medicare Other | Attending: Obstetrics and Gynecology | Admitting: Physical Therapy

## 2023-01-24 ENCOUNTER — Other Ambulatory Visit: Payer: Self-pay

## 2023-01-24 DIAGNOSIS — R279 Unspecified lack of coordination: Secondary | ICD-10-CM | POA: Diagnosis not present

## 2023-01-24 DIAGNOSIS — R293 Abnormal posture: Secondary | ICD-10-CM | POA: Diagnosis not present

## 2023-01-24 DIAGNOSIS — M6281 Muscle weakness (generalized): Secondary | ICD-10-CM | POA: Diagnosis not present

## 2023-01-24 NOTE — Therapy (Signed)
OUTPATIENT PHYSICAL THERAPY FEMALE PELVIC EVALUATION   Patient Name: [name] [name] MRN: [id number] DOB:23-Jul-1935, 88 y.o., female Today's Date: 01/24/2023  END OF SESSION:  PT End of Session - 01/24/23 1152     Visit Number 1    Date for PT Re-Evaluation 04/26/23    Authorization Type MCR    Progress Note Due on Visit 10    PT Start Time 1149   arrival   PT Stop Time 1235    PT Time Calculation (min) 46 min             Past Medical History:  Diagnosis Date   Allergic angioedema    from shellfish and tree nuts   Chest pain 2000 and 2001   negative cardiac workup   Complication of anesthesia    Diverticulosis    Fibrocystic breast disease    Hyperlipidemia    Hypertension    Irritable bowel syndrome    Perirectal cyst    ?lymphocoele   PMR (polymyalgia rheumatica) (HCC) 2011   PONV (postoperative nausea and vomiting)    Seborrheic dermatitis    Squamous cell skin cancer    X1, [facility]   Thyroid disease    post op hypothyroidism; TSH goal would be sup;pressive   Past Surgical History:  Procedure Laterality Date   BILIARY DILATION  04/23/2021   Procedure: BILIARY DILATION;  Surgeon: Lynann Bologna, MD;  [facility];  Service: Endoscopy;;   CHOLECYSTECTOMY N/A 04/24/2021   Procedure: LAPAROSCOPIC CHOLECYSTECTOMY;  Surgeon: Andria Meuse, MD;  [facility]: [facility];  Service: General;  Laterality: N/A;  90   COLONOSCOPY  2004   per Dr. Juanda Chance, clear but incomplete; Barium study added, repeat in 10 yrs    DILATION AND CURETTAGE OF UTERUS     ERCP N/A 04/23/2021   Procedure: ENDOSCOPIC RETROGRADE CHOLANGIOPANCREATOGRAPHY (ERCP);  Surgeon: Lynann Bologna, MD;  Location: [facility];  Service: Endoscopy;  Laterality: N/A;   ESOPHAGOGASTRODUODENOSCOPY (EGD) WITH PROPOFOL N/A 07/21/2018   Procedure: ESOPHAGOGASTRODUODENOSCOPY (EGD) WITH PROPOFOL;  Surgeon: Lynann Bologna, MD;  [facility];  Service: Endoscopy;  Laterality: N/A;    ESOPHAGOGASTRODUODENOSCOPY (EGD) WITH PROPOFOL N/A 04/23/2021   Procedure: ESOPHAGOGASTRODUODENOSCOPY (EGD) WITH PROPOFOL;  Surgeon: Lynann Bologna, MD;  [facility];  Service: Endoscopy;  Laterality: N/A;   exporatory surgery for rectal mass  1986   peri rectal cyst   FOREIGN BODY REMOVAL  07/21/2018   Procedure: FOREIGN BODY REMOVAL;  Surgeon: Lynann Bologna, MD;  [facility];  Service: Endoscopy;;   REMOVAL OF STONES  04/23/2021   Procedure: REMOVAL OF STONES;  Surgeon: Lynann Bologna, MD;  [facility];  Service: Endoscopy;;   SPHINCTEROTOMY  04/23/2021   Procedure: Dennison Mascot;  Surgeon: Lynann Bologna, MD;  [facility];  Service: Endoscopy;;   THYROIDECTOMY  1988   S/P radiation previously seen by Levonne Hubert, MD @ [facility], now followed by Dr. Uvaldo Rising   TONSILLECTOMY  1972   Patient Active Problem List   Diagnosis Date Noted   Incomplete bladder emptying 12/23/2022   GERD (gastroesophageal reflux disease) 08/02/2022   Pelvic floor dysfunction in female 08/02/2022   Follicular thyroid carcinoma (HCC) 07/22/2022   Depression with anxiety 06/18/2021   Choledocholithiasis 04/20/2021   Gallstone pancreatitis 04/20/2021   DERMATITIS, SEBORRHEIC 07/14/2010   Squamous cell skin cancer 07/14/2010   Polymyalgia rheumatica (HCC) 06/23/2010   POSTMENOPAUSAL SYNDROME 06/03/2010   ARTHRALGIA 06/03/2010   Hypothyroidism, postsurgical 06/16/2008   DIVERTICULOSIS, COLON 06/16/2008   HYPERLIPIDEMIA  06/29/2007   Essential hypertension 01/05/2007   IBS 01/05/2007   FIBROCYSTIC BREAST DISEASE 01/05/2007    PCP: Nelwyn Salisbury, MD  REFERRING PROVIDER: Marguerita Beards, MD  REFERRING DIAG: R33.9 (ICD-10-CM) - Incomplete bladder emptying  THERAPY DIAG:  Muscle weakness (generalized) - Plan: PT plan of care cert/re-cert  Abnormal posture - Plan: PT plan of care cert/re-cert  Unspecified lack of coordination - Plan: PT plan of care  cert/re-cert  Rationale for Evaluation and Treatment: Rehabilitation  ONSET DATE: 2023  SUBJECTIVE:                                                                                                                                                                                           SUBJECTIVE STATEMENT: Pt reports she has felt bladder feels low, worse during the day, laying down makes her have to empty but unable to fully empty once at toilet. Pt had study and has have residual urine after voids. Had pessary placed and reports this did not work for her and she removed it and denied replacement. No leakage.  Worse since moving and lifting a lot of heavy boxes.   Fluid intake: Yes: water - limits due to retention ~40 oz     PAIN:  Are you having pain? No   PRECAUTIONS: None  WEIGHT BEARING RESTRICTIONS: No  FALLS:  Has patient fallen in last 6 months? No  LIVING ENVIRONMENT: Lives with: lives alone Lives in: House/apartment   OCCUPATION: retired   PLOF: Independent  PATIENT GOALS: to have improved urine habits  PERTINENT HISTORY:  Emptying was dysfunctional with a elevated PVR ( ), a sustained detrusor contraction present,  abdominal straining present, dyssynergic urethral sphincter activity on EMG.   Allergic angioedema, Chest pain (2000 and 2001), Complication of anesthesia, Diverticulosis, Fibrocystic breast disease, Hyperlipidemia, Hypertension, Irritable bowel syndrome, Perirectal cyst, PMR (polymyalgia rheumatica) (2011), PONV (postoperative nausea and vomiting), Seborrheic dermatitis, Squamous cell skin cancer, and Thyroid disease  Sexual abuse: No  BOWEL MOVEMENT: Pain with bowel movement: No Type of bowel movement:Type (Bristol Stool Scale) 4, Frequency daily to every other day, and Strain No Fully empty rectum: Yes:   Leakage: No Pads: No Fiber supplement: No  URINATION: Pain with urination: No Fully empty bladder: No Stream: Weak Urgency: No -  only with laying down then goes to the bathroom then can empty a little better Frequency: 8 total - 6 at night Leakage:  none Pads: No  INTERCOURSE: Pain with intercourse:  not active  - wasn't painful however after an infection had a series over 9 years with urethral stretching and had a surgery where the  doctor "cleaned out the head of the urethra" then was having no pain. Was diagnosed with chronic cystitis and was treated for that and didn't have pain.  Ability to have vaginal penetration:  Yes:   Climax: not painful Marinoff Scale: 0/3  PREGNANCY: Vaginal deliveries 2 Tearing Yes: with the first one had severe tearing with forceps and large episiotomy  C-section deliveries 0 Currently pregnant No  PROLAPSE: Cystocele per chart review   OBJECTIVE:   DIAGNOSTIC FINDINGS:  Urodynamic Impression:  1. Sensation was normal; capacity was normal 2. Stress Incontinence was not demonstrated at normal pressures; 3. Detrusor Overactivity was not demonstrated. 4. Emptying was dysfunctional with a elevated PVR ( ), a sustained detrusor contraction present,  abdominal straining present, dyssynergic urethral sphincter activity on EMG.  COGNITION: Overall cognitive status: Within functional limits for tasks assessed     SENSATION: Light touch: Appears intact Proprioception: Appears intact  MUSCLE LENGTH: Hamstrings and adductors limited by 25%   POSTURE: rounded shoulders, forward head, and posterior pelvic tilt   LUMBARAROM/PROM:  A/PROM A/PROM  eval  Flexion Limited by 25%  Extension WFL  Right lateral flexion Limited by 25%  Left lateral flexion Limited by 25%  Right rotation Limited by 25%  Left rotation Limited by 25%   (Blank rows = not tested)  LOWER EXTREMITY ROM:  WFL  LOWER EXTREMITY MMT:  Hips bil grossly 4/5, knees 5/5  PALPATION:   General  no TTP throughout abdomen but tension noted throughout lower abdomen                External Perineal Exam  no TTP, widening of vaginal opening                             Internal Pelvic Floor possible grade 2-3 anterior wall laxity noted in hooklying at rest possibly 2 and with cough possible 3.   Patient confirms identification and approves PT to assess internal pelvic floor and treatment Yes  PELVIC MMT:   MMT eval  Vaginal 1/5 improved to 2/5 with quick taps, 7s, 4 reps - however required compensation to hold contraction from glutes/abdominals/thighs. And relaxation noted internally however difficult to identify if this was from compensatory muscle movement or true pelvic floor   Internal Anal Sphincter   External Anal Sphincter   Puborectalis   Diastasis Recti   (Blank rows = not tested)        TONE: Decreased in hooklying   PROLAPSE: Possible grade 2-3 anterior laxity in hooklying worsening with cough  TODAY'S TREATMENT:                                                                                                                              DATE:   01/24/23 EVAL Examination completed, findings reviewed, pt educated on POC, HEP, and prolapse relief positions. Pt motivated to participate in PT and agreeable to attempt recommendations.  PATIENT EDUCATION:  Education details: [id number] Person educated: Patient Education method: Explanation, Demonstration, Tactile cues, Verbal cues, and Handouts Education comprehension: verbalized understanding and returned demonstration  HOME EXERCISE PROGRAM: [id number]  ASSESSMENT:  CLINICAL IMPRESSION: Patient is a 88  y.o. female  who was seen today for physical therapy evaluation and treatment for incomplete bladder emptying. Pt had large PRV with testing, has anterior wall prolapse and attempted pessary use but unable to tolerate and declined replacement at this time.  Pt found to have decreased flexibility at spine and hips, tension at abdomen in lower quadrants but without pain, decreased bil hips and core strength. Holding breath  noted with MMTs. Patient consented to internal pelvic floor assessment vaginally this date and found to have decreased strength, endurance, and coordination. Patient benefited from verbal cues for improved technique with pelvic floor contractions and relaxation. Pt benefited from quick taps to improve contractions slightly. Anterior wall laxity noted as well, worse with coughing. Pt educated on findings, HEP, prolapse relief positions. Pt would benefit from additional PT to further address deficits.    OBJECTIVE IMPAIRMENTS: decreased coordination, decreased endurance, decreased mobility, decreased strength, increased fascial restrictions, impaired flexibility, improper body mechanics, postural dysfunction, and pain.   ACTIVITY LIMITATIONS: carrying, lifting, squatting, and continence  PARTICIPATION LIMITATIONS: community activity  PERSONAL FACTORS: Time since onset of injury/illness/exacerbation and 1 comorbidity: x2 vaginal births one with severe tearing  are also affecting patient's functional outcome.   REHAB POTENTIAL: Good  CLINICAL DECISION MAKING: Stable/uncomplicated  EVALUATION COMPLEXITY: Low   GOALS: Goals reviewed with patient? Yes  SHORT TERM GOALS: Target date: 02/14/23  Pt to be I with HEP.  Baseline: Goal status: INITIAL  2.  Pt to demonstrate at least 3/5 pelvic floor strength for improved pelvic stability and decreased strain at pelvic floor/ decrease leakage.  Baseline:  Goal status: INITIAL  3.  Pt to be I with pressure management techniques for decreased strain at pelvic floor and prolapse Baseline:  Goal status: INITIAL  4.  Pt to be I with breathing mechanics and voiding mechanics to decrease strain at pelvic floor and prolapse. Baseline:  Goal status: INITIAL   LONG TERM GOALS: Target date: 04/26/23  Pt to be I with advanced HEP.  Baseline:  Goal status: INITIAL  2.  Pt to demonstrate at least 3/5 pelvic floor strength and ability to fully contract  for 8 s and relax post contraction to for improved pelvic stability and decreased strain at pelvic floor/ decrease leakage.  Baseline:  Goal status: INITIAL  3.  Pt to demonstrate improved coordination of pelvic floor and breathing mechanics with body weight squat with appropriate synergistic patterns to decrease pain and leakage at least 75% of the time.    Baseline:  Goal status: INITIAL  4.  Pt to be I with relaxation techniques to decreased straining with attempts to urinate. Baseline:  Goal status: INITIAL  5.  Pt to report ability to void fully at least 75% of time for decreased feeling of retention.  Baseline:  Goal status: INITIAL  PLAN:  PT FREQUENCY: 1x/week  PT DURATION:  8 sessions  PLANNED INTERVENTIONS: Therapeutic exercises, Therapeutic activity, Neuromuscular re-education, Patient/Family education, Self Care, Joint mobilization, Aquatic Therapy, Dry Needling, Electrical stimulation, Spinal mobilization, Cryotherapy, Moist heat, scar mobilization, Taping, Biofeedback, and Manual therapy  PLAN FOR NEXT SESSION: internal as needed and pt consents, core and hip strengthening and pelvic floor, relaxation techniques, voiding mechanics, breathing mechanics, pelvic mobility, pressure management.  Otelia Sergeant, PT,  DPT 01/24/2411:58 PM

## 2023-02-27 ENCOUNTER — Ambulatory Visit: Payer: Medicare Other | Attending: Obstetrics and Gynecology | Admitting: Physical Therapy

## 2023-02-27 DIAGNOSIS — R293 Abnormal posture: Secondary | ICD-10-CM | POA: Insufficient documentation

## 2023-02-27 DIAGNOSIS — M6281 Muscle weakness (generalized): Secondary | ICD-10-CM | POA: Insufficient documentation

## 2023-02-27 DIAGNOSIS — R279 Unspecified lack of coordination: Secondary | ICD-10-CM | POA: Diagnosis not present

## 2023-02-27 NOTE — Therapy (Signed)
OUTPATIENT PHYSICAL THERAPY FEMALE PELVIC TREATMENT   Patient Name: [name] [name] MRN: [id number] DOB:09-27-1934, 88 y.o., female Today's Date: 02/27/2023  END OF SESSION:  PT End of Session - 02/27/23 1232     Visit Number 2    Date for PT Re-Evaluation 04/26/23    Authorization Type MCR    Progress Note Due on Visit 10    PT Start Time 1230    PT Stop Time 1310    PT Time Calculation (min) 40 min    Activity Tolerance Patient tolerated treatment well    Behavior During Therapy [facility] for tasks assessed/performed              Past Medical History:  Diagnosis Date   Allergic angioedema    from shellfish and tree nuts   Chest pain 2000 and 2001   negative cardiac workup   Complication of anesthesia    Diverticulosis    Fibrocystic breast disease    Hyperlipidemia    Hypertension    Irritable bowel syndrome    Perirectal cyst    ?lymphocoele   PMR (polymyalgia rheumatica) (HCC) 2011   PONV (postoperative nausea and vomiting)    Seborrheic dermatitis    Squamous cell skin cancer    X1, [facility]   Thyroid disease    post op hypothyroidism; TSH goal would be sup;pressive   Past Surgical History:  Procedure Laterality Date   BILIARY DILATION  04/23/2021   Procedure: BILIARY DILATION;  Surgeon: Lynann Bologna, MD;  Location: [facility];  Service: Endoscopy;;   CHOLECYSTECTOMY N/A 04/24/2021   Procedure: LAPAROSCOPIC CHOLECYSTECTOMY;  Surgeon: Andria Meuse, MD;  Location: [facility];  Service: General;  Laterality: N/A;  90   COLONOSCOPY  2004   per Dr. Juanda Chance, clear but incomplete; Barium study added, repeat in 10 yrs    DILATION AND CURETTAGE OF UTERUS     ERCP N/A 04/23/2021   Procedure: ENDOSCOPIC RETROGRADE CHOLANGIOPANCREATOGRAPHY (ERCP);  Surgeon: Lynann Bologna, MD;  Location: [facility];  Service: Endoscopy;  Laterality: N/A;   ESOPHAGOGASTRODUODENOSCOPY (EGD) WITH PROPOFOL N/A 07/21/2018   Procedure: ESOPHAGOGASTRODUODENOSCOPY (EGD) WITH PROPOFOL;   Surgeon: Lynann Bologna, MD;  Location: [facility];  Service: Endoscopy;  Laterality: N/A;   ESOPHAGOGASTRODUODENOSCOPY (EGD) WITH PROPOFOL N/A 04/23/2021   Procedure: ESOPHAGOGASTRODUODENOSCOPY (EGD) WITH PROPOFOL;  Surgeon: Lynann Bologna, MD;  Location: [facility];  Service: Endoscopy;  Laterality: N/A;   exporatory surgery for rectal mass  1986   peri rectal cyst   FOREIGN BODY REMOVAL  07/21/2018   Procedure: FOREIGN BODY REMOVAL;  Surgeon: Lynann Bologna, MD;  Location: [facility];  Service: Endoscopy;;   REMOVAL OF STONES  04/23/2021   Procedure: REMOVAL OF STONES;  Surgeon: Lynann Bologna, MD;  Location: [facility];  Service: Endoscopy;;   SPHINCTEROTOMY  04/23/2021   Procedure: Dennison Mascot;  Surgeon: Lynann Bologna, MD;  Location: [facility];  Service: Endoscopy;;   THYROIDECTOMY  1988   S/P radiation previously seen by Levonne Hubert, MD @ [facility], now followed by Dr. Uvaldo Rising   TONSILLECTOMY  1972   Patient Active Problem List   Diagnosis Date Noted   Incomplete bladder emptying 12/23/2022   GERD (gastroesophageal reflux disease) 08/02/2022   Pelvic floor dysfunction in female 08/02/2022   Follicular thyroid carcinoma (HCC) 07/22/2022   Depression with anxiety 06/18/2021   Choledocholithiasis 04/20/2021   Gallstone pancreatitis 04/20/2021   DERMATITIS, SEBORRHEIC 07/14/2010   Squamous cell skin cancer 07/14/2010   Polymyalgia rheumatica (HCC) 06/23/2010   POSTMENOPAUSAL SYNDROME  06/03/2010   ARTHRALGIA 06/03/2010   Hypothyroidism, postsurgical 06/16/2008   DIVERTICULOSIS, COLON 06/16/2008   HYPERLIPIDEMIA 06/29/2007   Essential hypertension 01/05/2007   IBS 01/05/2007   FIBROCYSTIC BREAST DISEASE 01/05/2007    PCP: Nelwyn Salisbury, MD  REFERRING PROVIDER: Marguerita Beards, MD  REFERRING DIAG: R33.9 (ICD-10-CM) - Incomplete bladder emptying  THERAPY DIAG:  Muscle weakness (generalized)  Abnormal posture  Unspecified lack of  coordination  Rationale for Evaluation and Treatment: Rehabilitation  ONSET DATE: 2023  SUBJECTIVE:                                                                                                                                                                                           SUBJECTIVE STATEMENT: Has bene trying to do prolapse relief positions but can't find wedge pillow and using a couple home pillows didn't help as much but does feel like the prolapse symptoms are better. Just not the emptying of bladder. Doesn't use internet and attempting to find wedge pillow.   Fluid intake: Yes: water - limits due to retention ~40 oz     PAIN:  Are you having pain? No   PRECAUTIONS: None  WEIGHT BEARING RESTRICTIONS: No  FALLS:  Has patient fallen in last 6 months? No  LIVING ENVIRONMENT: Lives with: lives alone Lives in: House/apartment   OCCUPATION: retired   PLOF: Independent  PATIENT GOALS: to have improved urine habits  PERTINENT HISTORY:  Emptying was dysfunctional with a elevated PVR ( ), a sustained detrusor contraction present,  abdominal straining present, dyssynergic urethral sphincter activity on EMG.   Allergic angioedema, Chest pain (2000 and 2001), Complication of anesthesia, Diverticulosis, Fibrocystic breast disease, Hyperlipidemia, Hypertension, Irritable bowel syndrome, Perirectal cyst, PMR (polymyalgia rheumatica) (2011), PONV (postoperative nausea and vomiting), Seborrheic dermatitis, Squamous cell skin cancer, and Thyroid disease  Sexual abuse: No  BOWEL MOVEMENT: Pain with bowel movement: No Type of bowel movement:Type (Bristol Stool Scale) 4, Frequency daily to every other day, and Strain No Fully empty rectum: Yes:   Leakage: No Pads: No Fiber supplement: No  URINATION: Pain with urination: No Fully empty bladder: No Stream: Weak Urgency: No - only with laying down then goes to the bathroom then can empty a little better Frequency: 8  total - 6 at night Leakage:  none Pads: No  INTERCOURSE: Pain with intercourse:  not active  - wasn't painful however after an infection had a series over 9 years with urethral stretching and had a surgery where the doctor "cleaned out the head of the urethra" then was having no pain. Was diagnosed with chronic cystitis and was treated for that and  didn't have pain.  Ability to have vaginal penetration:  Yes:   Climax: not painful Marinoff Scale: 0/3  PREGNANCY: Vaginal deliveries 2 Tearing Yes: with the first one had severe tearing with forceps and large episiotomy  C-section deliveries 0 Currently pregnant No  PROLAPSE: Cystocele per chart review   OBJECTIVE:   DIAGNOSTIC FINDINGS:  Urodynamic Impression:  1. Sensation was normal; capacity was normal 2. Stress Incontinence was not demonstrated at normal pressures; 3. Detrusor Overactivity was not demonstrated. 4. Emptying was dysfunctional with a elevated PVR ( ), a sustained detrusor contraction present,  abdominal straining present, dyssynergic urethral sphincter activity on EMG.  COGNITION: Overall cognitive status: Within functional limits for tasks assessed     SENSATION: Light touch: Appears intact Proprioception: Appears intact  MUSCLE LENGTH: Hamstrings and adductors limited by 25%   POSTURE: rounded shoulders, forward head, and posterior pelvic tilt   LUMBARAROM/PROM:  A/PROM A/PROM  eval  Flexion Limited by 25%  Extension WFL  Right lateral flexion Limited by 25%  Left lateral flexion Limited by 25%  Right rotation Limited by 25%  Left rotation Limited by 25%   (Blank rows = not tested)  LOWER EXTREMITY ROM:  WFL  LOWER EXTREMITY MMT:  Hips bil grossly 4/5, knees 5/5  PALPATION:   General  no TTP throughout abdomen but tension noted throughout lower abdomen                External Perineal Exam no TTP, widening of vaginal opening                             Internal Pelvic Floor  possible grade 2-3 anterior wall laxity noted in hooklying at rest possibly 2 and with cough possible 3.   Patient confirms identification and approves PT to assess internal pelvic floor and treatment Yes  PELVIC MMT:   MMT eval  Vaginal 1/5 improved to 2/5 with quick taps, 7s, 4 reps - however required compensation to hold contraction from glutes/abdominals/thighs. And relaxation noted internally however difficult to identify if this was from compensatory muscle movement or true pelvic floor   Internal Anal Sphincter   External Anal Sphincter   Puborectalis   Diastasis Recti   (Blank rows = not tested)        TONE: Decreased in hooklying   PROLAPSE: Possible grade 2-3 anterior laxity in hooklying worsening with cough  TODAY'S TREATMENT:                                                                                                                              DATE:   01/24/23 EVAL Examination completed, findings reviewed, pt educated on POC, HEP, and prolapse relief positions. Pt motivated to participate in PT and agreeable to attempt recommendations.    02/27/23: Therapeutic activity: Educated on voiding mechanics, breathing mechanics, balloon blowing technique, and pelvic tilts  Therapeutic exercise: Lumbar rotation 3x30s each,  x10 rotations  Butterfly 2x30s Pelvic tilts 2x10 - needed moderate cues for technique  Happy baby 2x30s HEP reviewed and given to pt for home     PATIENT EDUCATION:  Education details: [id number] Person educated: Patient Education method: Explanation, Demonstration, Tactile cues, Verbal cues, and Handouts Education comprehension: verbalized understanding and returned demonstration  HOME EXERCISE PROGRAM: [id number]  ASSESSMENT:  CLINICAL IMPRESSION: Patient presents for treatment, session focused on pelvic mobility and gentle core/ hip strengthening. Pt tolerated well, moderate verbal cues  for technique and relaxation techniques. Pt educated on  voiding and breathing  Pt would benefit from additional PT to further address deficits.    OBJECTIVE IMPAIRMENTS: decreased coordination, decreased endurance, decreased mobility, decreased strength, increased fascial restrictions, impaired flexibility, improper body mechanics, postural dysfunction, and pain.   ACTIVITY LIMITATIONS: carrying, lifting, squatting, and continence  PARTICIPATION LIMITATIONS: community activity  PERSONAL FACTORS: Time since onset of injury/illness/exacerbation and 1 comorbidity: x2 vaginal births one with severe tearing  are also affecting patient's functional outcome.   REHAB POTENTIAL: Good  CLINICAL DECISION MAKING: Stable/uncomplicated  EVALUATION COMPLEXITY: Low   GOALS: Goals reviewed with patient? Yes  SHORT TERM GOALS: Target date: 02/14/23  Pt to be I with HEP.  Baseline: Goal status: INITIAL  2.  Pt to demonstrate at least 3/5 pelvic floor strength for improved pelvic stability and decreased strain at pelvic floor/ decrease leakage.  Baseline:  Goal status: INITIAL  3.  Pt to be I with pressure management techniques for decreased strain at pelvic floor and prolapse Baseline:  Goal status: INITIAL  4.  Pt to be I with breathing mechanics and voiding mechanics to decrease strain at pelvic floor and prolapse. Baseline:  Goal status: INITIAL   LONG TERM GOALS: Target date: 04/26/23  Pt to be I with advanced HEP.  Baseline:  Goal status: INITIAL  2.  Pt to demonstrate at least 3/5 pelvic floor strength and ability to fully contract for 8 s and relax post contraction to for improved pelvic stability and decreased strain at pelvic floor/ decrease leakage.  Baseline:  Goal status: INITIAL  3.  Pt to demonstrate improved coordination of pelvic floor and breathing mechanics with body weight squat with appropriate synergistic patterns to decrease pain and leakage at least 75% of the time.    Baseline:  Goal status: INITIAL  4.  Pt to be I  with relaxation techniques to decreased straining with attempts to urinate. Baseline:  Goal status: INITIAL  5.  Pt to report ability to void fully at least 75% of time for decreased feeling of retention.  Baseline:  Goal status: INITIAL  PLAN:  PT FREQUENCY: 1x/week  PT DURATION:  8 sessions  PLANNED INTERVENTIONS: Therapeutic exercises, Therapeutic activity, Neuromuscular re-education, Patient/Family education, Self Care, Joint mobilization, Aquatic Therapy, Dry Needling, Electrical stimulation, Spinal mobilization, Cryotherapy, Moist heat, scar mobilization, Taping, Biofeedback, and Manual therapy  PLAN FOR NEXT SESSION: internal as needed and pt consents, core and hip strengthening and pelvic floor, relaxation techniques, voiding mechanics, breathing mechanics, pelvic mobility, pressure management.  Otelia Sergeant, PT, DPT 06/24/242:16 PM

## 2023-03-08 DIAGNOSIS — H18513 Endothelial corneal dystrophy, bilateral: Secondary | ICD-10-CM | POA: Diagnosis not present

## 2023-03-08 DIAGNOSIS — H25813 Combined forms of age-related cataract, bilateral: Secondary | ICD-10-CM | POA: Diagnosis not present

## 2023-03-08 DIAGNOSIS — H353131 Nonexudative age-related macular degeneration, bilateral, early dry stage: Secondary | ICD-10-CM | POA: Diagnosis not present

## 2023-03-13 ENCOUNTER — Ambulatory Visit: Payer: Medicare Other | Attending: Obstetrics and Gynecology | Admitting: Physical Therapy

## 2023-03-13 DIAGNOSIS — R279 Unspecified lack of coordination: Secondary | ICD-10-CM | POA: Diagnosis not present

## 2023-03-13 DIAGNOSIS — M62838 Other muscle spasm: Secondary | ICD-10-CM | POA: Diagnosis not present

## 2023-03-13 DIAGNOSIS — M6281 Muscle weakness (generalized): Secondary | ICD-10-CM | POA: Diagnosis not present

## 2023-03-13 DIAGNOSIS — R293 Abnormal posture: Secondary | ICD-10-CM | POA: Insufficient documentation

## 2023-03-13 NOTE — Therapy (Signed)
OUTPATIENT PHYSICAL THERAPY FEMALE PELVIC TREATMENT   Patient Name: COURTNEI GIORNO MRN: [id number] DOB:12-25-1934, 88 y.o., female Today's Date: 03/13/2023  END OF SESSION:  PT End of Session - 03/13/23 1225     Visit Number 3    Date for PT Re-Evaluation 04/26/23    Authorization Type MCR    Authorization Time Period 03/2123    Progress Note Due on Visit 10    PT Start Time 1230    PT Stop Time 1308    PT Time Calculation (min) 38 min    Activity Tolerance Patient tolerated treatment well    Behavior During Therapy [facility] for tasks assessed/performed              Past Medical History:  Diagnosis Date   Allergic angioedema    from shellfish and tree nuts   Chest pain 2000 and 2001   negative cardiac workup   Complication of anesthesia    Diverticulosis    Fibrocystic breast disease    Hyperlipidemia    Hypertension    Irritable bowel syndrome    Perirectal cyst    ?lymphocoele   PMR (polymyalgia rheumatica) (HCC) 2011   PONV (postoperative nausea and vomiting)    Seborrheic dermatitis    Squamous cell skin cancer    X1, [facility]   Thyroid disease    post op hypothyroidism; TSH goal would be sup;pressive   Past Surgical History:  Procedure Laterality Date   BILIARY DILATION  04/23/2021   Procedure: BILIARY DILATION;  Surgeon: Lynann Bologna, MD;  Location: [facility];  Service: Endoscopy;;   CHOLECYSTECTOMY N/A 04/24/2021   Procedure: LAPAROSCOPIC CHOLECYSTECTOMY;  Surgeon: Andria Meuse, MD;  Location: [facility];  Service: General;  Laterality: N/A;  90   COLONOSCOPY  2004   per Dr. Juanda Chance, clear but incomplete; Barium study added, repeat in 10 yrs    DILATION AND CURETTAGE OF UTERUS     ERCP N/A 04/23/2021   Procedure: ENDOSCOPIC RETROGRADE CHOLANGIOPANCREATOGRAPHY (ERCP);  Surgeon: Lynann Bologna, MD;  Location: [facility];  Service: Endoscopy;  Laterality: N/A;   ESOPHAGOGASTRODUODENOSCOPY (EGD) WITH PROPOFOL N/A 07/21/2018   Procedure:  ESOPHAGOGASTRODUODENOSCOPY (EGD) WITH PROPOFOL;  Surgeon: Lynann Bologna, MD;  Location: [facility];  Service: Endoscopy;  Laterality: N/A;   ESOPHAGOGASTRODUODENOSCOPY (EGD) WITH PROPOFOL N/A 04/23/2021   Procedure: ESOPHAGOGASTRODUODENOSCOPY (EGD) WITH PROPOFOL;  Surgeon: Lynann Bologna, MD;  Location: [facility];  Service: Endoscopy;  Laterality: N/A;   exporatory surgery for rectal mass  1986   peri rectal cyst   FOREIGN BODY REMOVAL  07/21/2018   Procedure: FOREIGN BODY REMOVAL;  Surgeon: Lynann Bologna, MD;  Location: [facility];  Service: Endoscopy;;   REMOVAL OF STONES  04/23/2021   Procedure: REMOVAL OF STONES;  Surgeon: Lynann Bologna, MD;  Location: [facility];  Service: Endoscopy;;   SPHINCTEROTOMY  04/23/2021   Procedure: Dennison Mascot;  Surgeon: Lynann Bologna, MD;  Location: [facility];  Service: Endoscopy;;   THYROIDECTOMY  1988   S/P radiation previously seen by Levonne Hubert, MD @ [facility], now followed by Dr. Uvaldo Rising   TONSILLECTOMY  1972   Patient Active Problem List   Diagnosis Date Noted   Incomplete bladder emptying 12/23/2022   GERD (gastroesophageal reflux disease) 08/02/2022   Pelvic floor dysfunction in female 08/02/2022   Follicular thyroid carcinoma (HCC) 07/22/2022   Depression with anxiety 06/18/2021   Choledocholithiasis 04/20/2021   Gallstone pancreatitis 04/20/2021   DERMATITIS, SEBORRHEIC 07/14/2010   Squamous cell skin cancer 07/14/2010   Polymyalgia  rheumatica (HCC) 06/23/2010   POSTMENOPAUSAL SYNDROME 06/03/2010   ARTHRALGIA 06/03/2010   Hypothyroidism, postsurgical 06/16/2008   DIVERTICULOSIS, COLON 06/16/2008   HYPERLIPIDEMIA 06/29/2007   Essential hypertension 01/05/2007   IBS 01/05/2007   FIBROCYSTIC BREAST DISEASE 01/05/2007    PCP: Nelwyn Salisbury, MD  REFERRING PROVIDER: Marguerita Beards, MD  REFERRING DIAG: R33.9 (ICD-10-CM) - Incomplete bladder emptying  THERAPY DIAG:  Muscle weakness (generalized)  Abnormal  posture  Unspecified lack of coordination  Rationale for Evaluation and Treatment: Rehabilitation  ONSET DATE: 2023  SUBJECTIVE:                                                                                                                                                                                           SUBJECTIVE STATEMENT: Sometimes emptying has been better, others not. Does still have an urge to go but unable to empty. Still takes AZO at night and helps relax everything but then in standing feels like they tighten again. "Those stretches have really been helping". States she feels more relaxed after stretching and sometimes better able to urinate after.    Fluid intake: Yes: water - limits due to retention ~20 oz after paying closer attention to it    PAIN:  Are you having pain? No   PRECAUTIONS: None  WEIGHT BEARING RESTRICTIONS: No  FALLS:  Has patient fallen in last 6 months? No  LIVING ENVIRONMENT: Lives with: lives alone Lives in: House/apartment   OCCUPATION: retired   PLOF: Independent  PATIENT GOALS: to have improved urine habits  PERTINENT HISTORY:  Emptying was dysfunctional with a elevated PVR ( ), a sustained detrusor contraction present,  abdominal straining present, dyssynergic urethral sphincter activity on EMG.   Allergic angioedema, Chest pain (2000 and 2001), Complication of anesthesia, Diverticulosis, Fibrocystic breast disease, Hyperlipidemia, Hypertension, Irritable bowel syndrome, Perirectal cyst, PMR (polymyalgia rheumatica) (2011), PONV (postoperative nausea and vomiting), Seborrheic dermatitis, Squamous cell skin cancer, and Thyroid disease  Sexual abuse: No  BOWEL MOVEMENT: Pain with bowel movement: No Type of bowel movement:Type (Bristol Stool Scale) 4, Frequency daily to every other day, and Strain No Fully empty rectum: Yes:   Leakage: No Pads: No Fiber supplement: No  URINATION: Pain with urination: No Fully empty  bladder: No Stream: Weak Urgency: No - only with laying down then goes to the bathroom then can empty a little better Frequency: 8 total - 6 at night Leakage:  none Pads: No  INTERCOURSE: Pain with intercourse:  not active  - wasn't painful however after an infection had a series over 9 years with urethral stretching and had a surgery where the doctor "cleaned  out the head of the urethra" then was having no pain. Was diagnosed with chronic cystitis and was treated for that and didn't have pain.  Ability to have vaginal penetration:  Yes:   Climax: not painful Marinoff Scale: 0/3  PREGNANCY: Vaginal deliveries 2 Tearing Yes: with the first one had severe tearing with forceps and large episiotomy  C-section deliveries 0 Currently pregnant No  PROLAPSE: Cystocele per chart review   OBJECTIVE:   DIAGNOSTIC FINDINGS:  Urodynamic Impression:  1. Sensation was normal; capacity was normal 2. Stress Incontinence was not demonstrated at normal pressures; 3. Detrusor Overactivity was not demonstrated. 4. Emptying was dysfunctional with a elevated PVR ( ), a sustained detrusor contraction present,  abdominal straining present, dyssynergic urethral sphincter activity on EMG.  COGNITION: Overall cognitive status: Within functional limits for tasks assessed     SENSATION: Light touch: Appears intact Proprioception: Appears intact  MUSCLE LENGTH: Hamstrings and adductors limited by 25%   POSTURE: rounded shoulders, forward head, and posterior pelvic tilt   LUMBARAROM/PROM:  A/PROM A/PROM  eval  Flexion Limited by 25%  Extension WFL  Right lateral flexion Limited by 25%  Left lateral flexion Limited by 25%  Right rotation Limited by 25%  Left rotation Limited by 25%   (Blank rows = not tested)  LOWER EXTREMITY ROM:  WFL  LOWER EXTREMITY MMT:  Hips bil grossly 4/5, knees 5/5  PALPATION:   General  no TTP throughout abdomen but tension noted throughout lower  abdomen                External Perineal Exam no TTP, widening of vaginal opening                             Internal Pelvic Floor possible grade 2-3 anterior wall laxity noted in hooklying at rest possibly 2 and with cough possible 3.   Patient confirms identification and approves PT to assess internal pelvic floor and treatment Yes  PELVIC MMT:   MMT eval  Vaginal 1/5 improved to 2/5 with quick taps, 7s, 4 reps - however required compensation to hold contraction from glutes/abdominals/thighs. And relaxation noted internally however difficult to identify if this was from compensatory muscle movement or true pelvic floor   Internal Anal Sphincter   External Anal Sphincter   Puborectalis   Diastasis Recti   (Blank rows = not tested)        TONE: Decreased in hooklying   PROLAPSE: Possible grade 2-3 anterior laxity in hooklying worsening with cough  TODAY'S TREATMENT:                                                                                                                              DATE:   01/24/23 EVAL Examination completed, findings reviewed, pt educated on POC, HEP, and prolapse relief positions. Pt motivated to participate in PT and agreeable to attempt recommendations.  02/27/23: Therapeutic activity: Educated on voiding mechanics, breathing mechanics, balloon blowing technique, and pelvic tilts  Therapeutic exercise: Lumbar rotation 3x30s each, x10 rotations  Butterfly 2x30s Pelvic tilts 2x10 - needed moderate cues for technique  Happy baby 2x30s HEP reviewed and given to pt for home   03/13/23: Manual: fascial release completed with pt in supine initially indirect then progressed to direct technique within pt's tolerance within lower abdomen with increased tension noted throughout but more so at Lt side and midline. Does have scar tissue at lower section of abdominal vertical scar and manual completed here as well. Suction cup used with lotion at skin - pt  denied all allergies to this - gentle stretching completed in all directions within pt tolerance. Then x10 diaphragmatic breathing and with this pt reports she feels less tight. Then in standing post treatment reports she feels much less tension.   PATIENT EDUCATION:  Education details: [id number] Person educated: Patient Education method: Explanation, Demonstration, Tactile cues, Verbal cues, and Handouts Education comprehension: verbalized understanding and returned demonstration  HOME EXERCISE PROGRAM: [id number]  ASSESSMENT:  CLINICAL IMPRESSION: Patient presents for treatment, session focused on manual at abdomen for decreased tension over bladder. Pt tolerated well and denied pain, reports she felt a lot better at end of session and feels like she could empty easier. Pt educated on voiding and breathing  Pt would benefit from additional PT to further address deficits.    OBJECTIVE IMPAIRMENTS: decreased coordination, decreased endurance, decreased mobility, decreased strength, increased fascial restrictions, impaired flexibility, improper body mechanics, postural dysfunction, and pain.   ACTIVITY LIMITATIONS: carrying, lifting, squatting, and continence  PARTICIPATION LIMITATIONS: community activity  PERSONAL FACTORS: Time since onset of injury/illness/exacerbation and 1 comorbidity: x2 vaginal births one with severe tearing  are also affecting patient's functional outcome.   REHAB POTENTIAL: Good  CLINICAL DECISION MAKING: Stable/uncomplicated  EVALUATION COMPLEXITY: Low   GOALS: Goals reviewed with patient? Yes  SHORT TERM GOALS: Target date: 02/14/23  Pt to be I with HEP.  Baseline: Goal status: INITIAL  2.  Pt to demonstrate at least 3/5 pelvic floor strength for improved pelvic stability and decreased strain at pelvic floor/ decrease leakage.  Baseline:  Goal status: INITIAL  3.  Pt to be I with pressure management techniques for decreased strain at pelvic floor and  prolapse Baseline:  Goal status: INITIAL  4.  Pt to be I with breathing mechanics and voiding mechanics to decrease strain at pelvic floor and prolapse. Baseline:  Goal status: INITIAL   LONG TERM GOALS: Target date: 04/26/23  Pt to be I with advanced HEP.  Baseline:  Goal status: INITIAL  2.  Pt to demonstrate at least 3/5 pelvic floor strength and ability to fully contract for 8 s and relax post contraction to for improved pelvic stability and decreased strain at pelvic floor/ decrease leakage.  Baseline:  Goal status: INITIAL  3.  Pt to demonstrate improved coordination of pelvic floor and breathing mechanics with body weight squat with appropriate synergistic patterns to decrease pain and leakage at least 75% of the time.    Baseline:  Goal status: INITIAL  4.  Pt to be I with relaxation techniques to decreased straining with attempts to urinate. Baseline:  Goal status: INITIAL  5.  Pt to report ability to void fully at least 75% of time for decreased feeling of retention.  Baseline:  Goal status: INITIAL  PLAN:  PT FREQUENCY: 1x/week  PT DURATION:  8 sessions  PLANNED INTERVENTIONS: Therapeutic  exercises, Therapeutic activity, Neuromuscular re-education, Patient/Family education, Self Care, Joint mobilization, Aquatic Therapy, Dry Needling, Electrical stimulation, Spinal mobilization, Cryotherapy, Moist heat, scar mobilization, Taping, Biofeedback, and Manual therapy  PLAN FOR NEXT SESSION: internal as needed and pt consents, core and hip strengthening and pelvic floor, relaxation techniques, voiding mechanics, breathing mechanics, pelvic mobility, pressure management.  Otelia Sergeant, PT, DPT 07/08/242:10 PM

## 2023-03-20 ENCOUNTER — Ambulatory Visit: Payer: Medicare Other | Admitting: Physical Therapy

## 2023-03-20 DIAGNOSIS — R279 Unspecified lack of coordination: Secondary | ICD-10-CM

## 2023-03-20 DIAGNOSIS — R293 Abnormal posture: Secondary | ICD-10-CM

## 2023-03-20 DIAGNOSIS — M6281 Muscle weakness (generalized): Secondary | ICD-10-CM

## 2023-03-20 DIAGNOSIS — M62838 Other muscle spasm: Secondary | ICD-10-CM | POA: Diagnosis not present

## 2023-03-20 NOTE — Therapy (Signed)
OUTPATIENT PHYSICAL THERAPY FEMALE PELVIC TREATMENT   Patient Name: [name] [name] MRN: [id number] DOB:[birth year], 88 y.o., female Today's Date: 03/20/2023  END OF SESSION:  PT End of Session - 03/20/23 1234     Visit Number 4    Date for PT Re-Evaluation 04/26/23    Authorization Type MCR    Authorization Time Period 03/2123    Progress Note Due on Visit 10    PT Start Time 1232    PT Stop Time 1310    PT Time Calculation (min) 38 min    Activity Tolerance Patient tolerated treatment well    Behavior During Therapy [facility] for tasks assessed/performed              Past Medical History:  Diagnosis Date   Allergic angioedema    from shellfish and tree nuts   Chest pain 2000 and 2001   negative cardiac workup   Complication of anesthesia    Diverticulosis    Fibrocystic breast disease    Hyperlipidemia    Hypertension    Irritable bowel syndrome    Perirectal cyst    ?lymphocoele   PMR (polymyalgia rheumatica) (HCC) 2011   PONV (postoperative nausea and vomiting)    Seborrheic dermatitis    Squamous cell skin cancer    X1, [facility]   Thyroid disease    post op hypothyroidism; TSH goal would be sup;pressive   Past Surgical History:  Procedure Laterality Date   BILIARY DILATION  04/23/2021   Procedure: BILIARY DILATION;  Surgeon: Lynann Bologna, MD;  Location: [facility];  Service: Endoscopy;;   CHOLECYSTECTOMY N/A 04/24/2021   Procedure: LAPAROSCOPIC CHOLECYSTECTOMY;  Surgeon: Andria Meuse, MD;  Location: [facility];  Service: General;  Laterality: N/A;  90   COLONOSCOPY  2004   per Dr. Juanda Chance, clear but incomplete; Barium study added, repeat in 10 yrs    DILATION AND CURETTAGE OF UTERUS     ERCP N/A 04/23/2021   Procedure: ENDOSCOPIC RETROGRADE CHOLANGIOPANCREATOGRAPHY (ERCP);  Surgeon: Lynann Bologna, MD;  Location: [facility];  Service: Endoscopy;  Laterality: N/A;   ESOPHAGOGASTRODUODENOSCOPY (EGD) WITH PROPOFOL N/A 07/21/2018   Procedure:  ESOPHAGOGASTRODUODENOSCOPY (EGD) WITH PROPOFOL;  Surgeon: Lynann Bologna, MD;  Location: [facility];  Service: Endoscopy;  Laterality: N/A;   ESOPHAGOGASTRODUODENOSCOPY (EGD) WITH PROPOFOL N/A 04/23/2021   Procedure: ESOPHAGOGASTRODUODENOSCOPY (EGD) WITH PROPOFOL;  Surgeon: Lynann Bologna, MD;  Location: [facility];  Service: Endoscopy;  Laterality: N/A;   exporatory surgery for rectal mass  1986   peri rectal cyst   FOREIGN BODY REMOVAL  07/21/2018   Procedure: FOREIGN BODY REMOVAL;  Surgeon: Lynann Bologna, MD;  Location: [facility];  Service: Endoscopy;;   REMOVAL OF STONES  04/23/2021   Procedure: REMOVAL OF STONES;  Surgeon: Lynann Bologna, MD;  Location: [facility];  Service: Endoscopy;;   SPHINCTEROTOMY  04/23/2021   Procedure: Dennison Mascot;  Surgeon: Lynann Bologna, MD;  Location: [facility];  Service: Endoscopy;;   THYROIDECTOMY  1988   S/P radiation previously seen by Levonne Hubert, MD @ [facility], now followed by Dr. Uvaldo Rising   TONSILLECTOMY  1972   Patient Active Problem List   Diagnosis Date Noted   Incomplete bladder emptying 12/23/2022   GERD (gastroesophageal reflux disease) 08/02/2022   Pelvic floor dysfunction in female 08/02/2022   Follicular thyroid carcinoma (HCC) 07/22/2022   Depression with anxiety 06/18/2021   Choledocholithiasis 04/20/2021   Gallstone pancreatitis 04/20/2021   DERMATITIS, SEBORRHEIC 07/14/2010   Squamous cell skin cancer 07/14/2010   Polymyalgia  rheumatica (HCC) 06/23/2010   POSTMENOPAUSAL SYNDROME 06/03/2010   ARTHRALGIA 06/03/2010   Hypothyroidism, postsurgical 06/16/2008   DIVERTICULOSIS, COLON 06/16/2008   HYPERLIPIDEMIA 06/29/2007   Essential hypertension 01/05/2007   IBS 01/05/2007   FIBROCYSTIC BREAST DISEASE 01/05/2007    PCP: Nelwyn Salisbury, MD  REFERRING PROVIDER: Marguerita Beards, MD  REFERRING DIAG: R33.9 (ICD-10-CM) - Incomplete bladder emptying  THERAPY DIAG:  Muscle weakness (generalized)  Abnormal  posture  Unspecified lack of coordination  Rationale for Evaluation and Treatment: Rehabilitation  ONSET DATE: 2023  SUBJECTIVE:                                                                                                                                                                                           SUBJECTIVE STATEMENT: Had good results after last session. Reports improved ability to urinate though small voids and slowly but more able, also less wait time to urinate with sitting then emptying.   Fluid intake: Yes: water - limits due to retention ~20 oz after paying closer attention to it    PAIN:  Are you having pain? No   PRECAUTIONS: None  WEIGHT BEARING RESTRICTIONS: No  FALLS:  Has patient fallen in last 6 months? No  LIVING ENVIRONMENT: Lives with: lives alone Lives in: House/apartment   OCCUPATION: retired   PLOF: Independent  PATIENT GOALS: to have improved urine habits  PERTINENT HISTORY:  Emptying was dysfunctional with a elevated PVR ( ), a sustained detrusor contraction present,  abdominal straining present, dyssynergic urethral sphincter activity on EMG.   Allergic angioedema, Chest pain (2000 and 2001), Complication of anesthesia, Diverticulosis, Fibrocystic breast disease, Hyperlipidemia, Hypertension, Irritable bowel syndrome, Perirectal cyst, PMR (polymyalgia rheumatica) (2011), PONV (postoperative nausea and vomiting), Seborrheic dermatitis, Squamous cell skin cancer, and Thyroid disease  Sexual abuse: No  BOWEL MOVEMENT: Pain with bowel movement: No Type of bowel movement:Type (Bristol Stool Scale) 4, Frequency daily to every other day, and Strain No Fully empty rectum: Yes:   Leakage: No Pads: No Fiber supplement: No  URINATION: Pain with urination: No Fully empty bladder: No Stream: Weak Urgency: No - only with laying down then goes to the bathroom then can empty a little better Frequency: 8 total - 6 at night Leakage:   none Pads: No  INTERCOURSE: Pain with intercourse:  not active  - wasn't painful however after an infection had a series over 9 years with urethral stretching and had a surgery where the doctor "cleaned out the head of the urethra" then was having no pain. Was diagnosed with chronic cystitis and was treated for that and didn't have pain.  Ability to  have vaginal penetration:  Yes:   Climax: not painful Marinoff Scale: 0/3  PREGNANCY: Vaginal deliveries 2 Tearing Yes: with the first one had severe tearing with forceps and large episiotomy  C-section deliveries 0 Currently pregnant No  PROLAPSE: Cystocele per chart review   OBJECTIVE:   DIAGNOSTIC FINDINGS:  Urodynamic Impression:  1. Sensation was normal; capacity was normal 2. Stress Incontinence was not demonstrated at normal pressures; 3. Detrusor Overactivity was not demonstrated. 4. Emptying was dysfunctional with a elevated PVR ( ), a sustained detrusor contraction present,  abdominal straining present, dyssynergic urethral sphincter activity on EMG.  COGNITION: Overall cognitive status: Within functional limits for tasks assessed     SENSATION: Light touch: Appears intact Proprioception: Appears intact  MUSCLE LENGTH: Hamstrings and adductors limited by 25%   POSTURE: rounded shoulders, forward head, and posterior pelvic tilt   LUMBARAROM/PROM:  A/PROM A/PROM  eval  Flexion Limited by 25%  Extension WFL  Right lateral flexion Limited by 25%  Left lateral flexion Limited by 25%  Right rotation Limited by 25%  Left rotation Limited by 25%   (Blank rows = not tested)  LOWER EXTREMITY ROM:  WFL  LOWER EXTREMITY MMT:  Hips bil grossly 4/5, knees 5/5  PALPATION:   General  no TTP throughout abdomen but tension noted throughout lower abdomen                External Perineal Exam no TTP, widening of vaginal opening                             Internal Pelvic Floor possible grade 2-3 anterior wall  laxity noted in hooklying at rest possibly 2 and with cough possible 3.   Patient confirms identification and approves PT to assess internal pelvic floor and treatment Yes  PELVIC MMT:   MMT eval  Vaginal 1/5 improved to 2/5 with quick taps, 7s, 4 reps - however required compensation to hold contraction from glutes/abdominals/thighs. And relaxation noted internally however difficult to identify if this was from compensatory muscle movement or true pelvic floor   Internal Anal Sphincter   External Anal Sphincter   Puborectalis   Diastasis Recti   (Blank rows = not tested)        TONE: Decreased in hooklying   PROLAPSE: Possible grade 2-3 anterior laxity in hooklying worsening with cough  TODAY'S TREATMENT:                                                                                                                              DATE:   03/20/23  Manual: fascial release completed with pt in supine initially indirect then progressed to direct technique within pt's tolerance within lower abdomen with increased tension noted throughout but more so at Lt side and midline. Does have scar tissue at lower section of abdominal vertical scar and manual completed here as well. Suction cup used with  lotion at skin - pt denied all allergies to this - gentle stretching completed in all directions within pt tolerance. Then x10 diaphragmatic breathing and with this pt reports she feels less tight. Then in standing post treatment reports she feels much less tension.   PATIENT EDUCATION:  Education details: [id number] Person educated: Patient Education method: Explanation, Demonstration, Tactile cues, Verbal cues, and Handouts Education comprehension: verbalized understanding and returned demonstration  HOME EXERCISE PROGRAM: [id number]  ASSESSMENT:  CLINICAL IMPRESSION: Patient presents for treatment, session focused on manual at abdomen for decreased tension over bladder. Pt tolerated well and  denied pain, reports she felt a lot better at end of session and feels like she could empty easier. Does report she felt better after returning home last session and able to urinate a little easier, also educated on gentle mobility at abdomen at home to improve between sessions. Pt educated on voiding and breathing  Pt would benefit from additional PT to further address deficits.    OBJECTIVE IMPAIRMENTS: decreased coordination, decreased endurance, decreased mobility, decreased strength, increased fascial restrictions, impaired flexibility, improper body mechanics, postural dysfunction, and pain.   ACTIVITY LIMITATIONS: carrying, lifting, squatting, and continence  PARTICIPATION LIMITATIONS: community activity  PERSONAL FACTORS: Time since onset of injury/illness/exacerbation and 1 comorbidity: x2 vaginal births one with severe tearing  are also affecting patient's functional outcome.   REHAB POTENTIAL: Good  CLINICAL DECISION MAKING: Stable/uncomplicated  EVALUATION COMPLEXITY: Low   GOALS: Goals reviewed with patient? Yes  SHORT TERM GOALS: Target date: 02/14/23  Pt to be I with HEP.  Baseline: Goal status: MET  2.  Pt to demonstrate at least 3/5 pelvic floor strength for improved pelvic stability and decreased strain at pelvic floor/ decrease leakage.  Baseline:  Goal status: on going  3.  Pt to be I with pressure management techniques for decreased strain at pelvic floor and prolapse Baseline:  Goal status: on going  4.  Pt to be I with breathing mechanics and voiding mechanics to decrease strain at pelvic floor and prolapse. Baseline:  Goal status: MET   LONG TERM GOALS: Target date: 04/26/23  Pt to be I with advanced HEP.  Baseline:  Goal status: INITIAL  2.  Pt to demonstrate at least 3/5 pelvic floor strength and ability to fully contract for 8 s and relax post contraction to for improved pelvic stability and decreased strain at pelvic floor/ decrease leakage.   Baseline:  Goal status: INITIAL  3.  Pt to demonstrate improved coordination of pelvic floor and breathing mechanics with body weight squat with appropriate synergistic patterns to decrease pain and leakage at least 75% of the time.    Baseline:  Goal status: INITIAL  4.  Pt to be I with relaxation techniques to decreased straining with attempts to urinate. Baseline:  Goal status: INITIAL  5.  Pt to report ability to void fully at least 75% of time for decreased feeling of retention.  Baseline:  Goal status: INITIAL  PLAN:  PT FREQUENCY: 1x/week  PT DURATION:  8 sessions  PLANNED INTERVENTIONS: Therapeutic exercises, Therapeutic activity, Neuromuscular re-education, Patient/Family education, Self Care, Joint mobilization, Aquatic Therapy, Dry Needling, Electrical stimulation, Spinal mobilization, Cryotherapy, Moist heat, scar mobilization, Taping, Biofeedback, and Manual therapy  PLAN FOR NEXT SESSION: internal as needed and pt consents, core and hip strengthening and pelvic floor, relaxation techniques, voiding mechanics, breathing mechanics, pelvic mobility, pressure management.  Otelia Sergeant, PT, DPT 07/15/241:12 PM

## 2023-03-27 ENCOUNTER — Ambulatory Visit: Payer: Medicare Other | Admitting: Physical Therapy

## 2023-03-27 DIAGNOSIS — R293 Abnormal posture: Secondary | ICD-10-CM | POA: Diagnosis not present

## 2023-03-27 DIAGNOSIS — M62838 Other muscle spasm: Secondary | ICD-10-CM

## 2023-03-27 DIAGNOSIS — R279 Unspecified lack of coordination: Secondary | ICD-10-CM

## 2023-03-27 DIAGNOSIS — M6281 Muscle weakness (generalized): Secondary | ICD-10-CM

## 2023-03-27 NOTE — Therapy (Signed)
OUTPATIENT PHYSICAL THERAPY FEMALE PELVIC TREATMENT   Patient Name: [name] [name] MRN: [id number] DOB:[birth year], 88 y.o., female Today's Date: 03/27/2023 Progress Note Reporting Period 01/24/23 to 03/27/2023   See note below for Objective Data and Assessment of Progress/Goals.     END OF SESSION:  PT End of Session - 03/27/23 1231     Visit Number 5    Date for PT Re-Evaluation 05/28/23    Authorization Type MCR    Authorization Time Period 05/28/23    Progress Note Due on Visit 15   progress note done at visit 5 with MCR recert   PT Start Time 1231    PT Stop Time 1312    PT Time Calculation (min) 41 min    Activity Tolerance Patient tolerated treatment well    Behavior During Therapy [facility] for tasks assessed/performed              Past Medical History:  Diagnosis Date   Allergic angioedema    from shellfish and tree nuts   Chest pain 2000 and 2001   negative cardiac workup   Complication of anesthesia    Diverticulosis    Fibrocystic breast disease    Hyperlipidemia    Hypertension    Irritable bowel syndrome    Perirectal cyst    ?lymphocoele   PMR (polymyalgia rheumatica) (HCC) 2011   PONV (postoperative nausea and vomiting)    Seborrheic dermatitis    Squamous cell skin cancer    X1, [facility]   Thyroid disease    post op hypothyroidism; TSH goal would be sup;pressive   Past Surgical History:  Procedure Laterality Date   BILIARY DILATION  04/23/2021   Procedure: BILIARY DILATION;  Surgeon: Lynann Bologna, MD;  Location: [facility];  Service: Endoscopy;;   CHOLECYSTECTOMY N/A 04/24/2021   Procedure: LAPAROSCOPIC CHOLECYSTECTOMY;  Surgeon: Andria Meuse, MD;  Location: [facility];  Service: General;  Laterality: N/A;  90   COLONOSCOPY  2004   per Dr. Juanda Chance, clear but incomplete; Barium study added, repeat in 10 yrs    DILATION AND CURETTAGE OF UTERUS     ERCP N/A 04/23/2021   Procedure: ENDOSCOPIC RETROGRADE CHOLANGIOPANCREATOGRAPHY (ERCP);   Surgeon: Lynann Bologna, MD;  Location: [facility];  Service: Endoscopy;  Laterality: N/A;   ESOPHAGOGASTRODUODENOSCOPY (EGD) WITH PROPOFOL N/A 07/21/2018   Procedure: ESOPHAGOGASTRODUODENOSCOPY (EGD) WITH PROPOFOL;  Surgeon: Lynann Bologna, MD;  Location: [facility];  Service: Endoscopy;  Laterality: N/A;   ESOPHAGOGASTRODUODENOSCOPY (EGD) WITH PROPOFOL N/A 04/23/2021   Procedure: ESOPHAGOGASTRODUODENOSCOPY (EGD) WITH PROPOFOL;  Surgeon: Lynann Bologna, MD;  Location: [facility];  Service: Endoscopy;  Laterality: N/A;   exporatory surgery for rectal mass  1986   peri rectal cyst   FOREIGN BODY REMOVAL  07/21/2018   Procedure: FOREIGN BODY REMOVAL;  Surgeon: Lynann Bologna, MD;  Location: [facility];  Service: Endoscopy;;   REMOVAL OF STONES  04/23/2021   Procedure: REMOVAL OF STONES;  Surgeon: Lynann Bologna, MD;  Location: [facility];  Service: Endoscopy;;   SPHINCTEROTOMY  04/23/2021   Procedure: Dennison Mascot;  Surgeon: Lynann Bologna, MD;  Location: [facility];  Service: Endoscopy;;   THYROIDECTOMY  1988   S/P radiation previously seen by Levonne Hubert, MD @ [facility], now followed by Dr. Uvaldo Rising   TONSILLECTOMY  1972   Patient Active Problem List   Diagnosis Date Noted   Incomplete bladder emptying 12/23/2022   GERD (gastroesophageal reflux disease) 08/02/2022   Pelvic floor dysfunction in female 08/02/2022   Follicular thyroid carcinoma (  HCC) 07/22/2022   Depression with anxiety 06/18/2021   Choledocholithiasis 04/20/2021   Gallstone pancreatitis 04/20/2021   DERMATITIS, SEBORRHEIC 07/14/2010   Squamous cell skin cancer 07/14/2010   Polymyalgia rheumatica (HCC) 06/23/2010   POSTMENOPAUSAL SYNDROME 06/03/2010   ARTHRALGIA 06/03/2010   Hypothyroidism, postsurgical 06/16/2008   DIVERTICULOSIS, COLON 06/16/2008   HYPERLIPIDEMIA 06/29/2007   Essential hypertension 01/05/2007   IBS 01/05/2007   FIBROCYSTIC BREAST DISEASE 01/05/2007    PCP: Nelwyn Salisbury,  MD  REFERRING PROVIDER: Marguerita Beards, MD  REFERRING DIAG: R33.9 (ICD-10-CM) - Incomplete bladder emptying  THERAPY DIAG:  Muscle weakness (generalized)  Abnormal posture  Unspecified lack of coordination  Other muscle spasm  Rationale for Evaluation and Treatment: Rehabilitation  ONSET DATE: 2023  SUBJECTIVE:                                                                                                                                                                                           SUBJECTIVE STATEMENT: Reports after last Monday's appointment felt much better and able to empty better and lasted for a few days then toward the weekend pain started returning. Last night was back to being terrible and went to the bathroom all night long, finally able to sleep at 6am.  Reports overall now able to relax a little more now and does do exercises at home and able to empty a little more each time. Reports she is able to empty larger amounts and get it started more.   Fluid intake: Yes: water - limits due to retention ~20 oz after paying closer attention to it    PAIN:  Are you having pain? No   PRECAUTIONS: None  WEIGHT BEARING RESTRICTIONS: No  FALLS:  Has patient fallen in last 6 months? No  LIVING ENVIRONMENT: Lives with: lives alone Lives in: House/apartment   OCCUPATION: retired   PLOF: Independent  PATIENT GOALS: to have improved urine habits  PERTINENT HISTORY:  Emptying was dysfunctional with a elevated PVR ( ), a sustained detrusor contraction present,  abdominal straining present, dyssynergic urethral sphincter activity on EMG.   Allergic angioedema, Chest pain (2000 and 2001), Complication of anesthesia, Diverticulosis, Fibrocystic breast disease, Hyperlipidemia, Hypertension, Irritable bowel syndrome, Perirectal cyst, PMR (polymyalgia rheumatica) (2011), PONV (postoperative nausea and vomiting), Seborrheic dermatitis, Squamous cell skin  cancer, and Thyroid disease  Sexual abuse: No  BOWEL MOVEMENT: Pain with bowel movement: No Type of bowel movement:Type (Bristol Stool Scale) 4, Frequency daily to every other day, and Strain No Fully empty rectum: Yes:   Leakage: No Pads: No Fiber supplement: No  URINATION: Pain with urination: No Fully empty bladder: No  Stream: Weak Urgency: No - only with laying down then goes to the bathroom then can empty a little better Frequency: 8 total - 6 at night Leakage:  none Pads: No  INTERCOURSE: Pain with intercourse:  not active  - wasn't painful however after an infection had a series over 9 years with urethral stretching and had a surgery where the doctor "cleaned out the head of the urethra" then was having no pain. Was diagnosed with chronic cystitis and was treated for that and didn't have pain.  Ability to have vaginal penetration:  Yes:   Climax: not painful Marinoff Scale: 0/3  PREGNANCY: Vaginal deliveries 2 Tearing Yes: with the first one had severe tearing with forceps and large episiotomy  C-section deliveries 0 Currently pregnant No  PROLAPSE: Cystocele per chart review   OBJECTIVE:   DIAGNOSTIC FINDINGS:  Urodynamic Impression:  1. Sensation was normal; capacity was normal 2. Stress Incontinence was not demonstrated at normal pressures; 3. Detrusor Overactivity was not demonstrated. 4. Emptying was dysfunctional with a elevated PVR ( ), a sustained detrusor contraction present,  abdominal straining present, dyssynergic urethral sphincter activity on EMG.  COGNITION: Overall cognitive status: Within functional limits for tasks assessed     SENSATION: Light touch: Appears intact Proprioception: Appears intact  MUSCLE LENGTH: Hamstrings and adductors limited by 25%   POSTURE: rounded shoulders, forward head, and posterior pelvic tilt   LUMBARAROM/PROM:  A/PROM A/PROM  eval  Flexion Limited by 25%  Extension WFL  Right lateral flexion  Limited by 25%  Left lateral flexion Limited by 25%  Right rotation Limited by 25%  Left rotation Limited by 25%   (Blank rows = not tested)  LOWER EXTREMITY ROM:  WFL  LOWER EXTREMITY MMT:  Hips bil grossly 4/5, knees 5/5  PALPATION:   General  no TTP throughout abdomen but tension noted throughout lower abdomen                External Perineal Exam no TTP, widening of vaginal opening                             Internal Pelvic Floor possible grade 2-3 anterior wall laxity noted in hooklying at rest possibly 2 and with cough possible 3.   Patient confirms identification and approves PT to assess internal pelvic floor and treatment Yes  PELVIC MMT:   MMT eval  Vaginal 1/5 improved to 2/5 with quick taps, 7s, 4 reps - however required compensation to hold contraction from glutes/abdominals/thighs. And relaxation noted internally however difficult to identify if this was from compensatory muscle movement or true pelvic floor   Internal Anal Sphincter   External Anal Sphincter   Puborectalis   Diastasis Recti   (Blank rows = not tested)        TONE: Decreased in hooklying   PROLAPSE: Possible grade 2-3 anterior laxity in hooklying worsening with cough  TODAY'S TREATMENT:  DATE:   03/27/23  Manual: Patient consented to internal pelvic floor treatment vaginally this date. Manual at urethra with tension noted bil, no pain per pt and gentle mobility stretching complete in bil sides. Pt tolerated well. Release noted and pt reported she felt more able to relax after this.  External manual work at low abdomen at suprapubic region. Pt does continue to have tension here with decreased fascial mobility. Direct fascial release technique completed with overall improved mobility compared to previous session at start of manual work but post treatment today felt much  better. Pt states "This feels really good. I feel like I'm much more relaxed." During treatment with manual work pt educated to continued relaxation techniques, implement pelvic tilts/circles with toileting to improved relaxation and more emptying of urine pt agreed.   PATIENT EDUCATION:  Education details: [id number] Person educated: Patient Education method: Explanation, Demonstration, Tactile cues, Verbal cues, and Handouts Education comprehension: verbalized understanding and returned demonstration  HOME EXERCISE PROGRAM: [id number]  ASSESSMENT:  CLINICAL IMPRESSION: Patient presents for treatment, session focused on manual at abdomen for decreased tension over bladder. Pt tolerated well and denied pain, reports she felt a lot better at end of session and less tension felt per pt. For the past two sessions pt reports greatly improved symptoms for a few days after manual work in clinic, not total improvement but able to tell a difference and empty urine better during the day. Pt would benefit from additional PT to further address deficits.    OBJECTIVE IMPAIRMENTS: decreased coordination, decreased endurance, decreased mobility, decreased strength, increased fascial restrictions, impaired flexibility, improper body mechanics, postural dysfunction, and pain.   ACTIVITY LIMITATIONS: carrying, lifting, squatting, and continence  PARTICIPATION LIMITATIONS: community activity  PERSONAL FACTORS: Time since onset of injury/illness/exacerbation and 1 comorbidity: x2 vaginal births one with severe tearing  are also affecting patient's functional outcome.   REHAB POTENTIAL: Good  CLINICAL DECISION MAKING: Stable/uncomplicated  EVALUATION COMPLEXITY: Low   GOALS: Goals reviewed with patient? Yes  SHORT TERM GOALS: Target date: 02/14/23  Pt to be I with HEP.  Baseline: Goal status: MET  2.  Pt to demonstrate at least 3/5 pelvic floor strength for improved pelvic stability and decreased  strain at pelvic floor/ decrease leakage.  Baseline:  Goal status: on going  3.  Pt to be I with pressure management techniques for decreased strain at pelvic floor and prolapse Baseline:  Goal status: on going  4.  Pt to be I with breathing mechanics and voiding mechanics to decrease strain at pelvic floor and prolapse. Baseline:  Goal status: MET   LONG TERM GOALS: Target date: 04/26/23  Pt to be I with advanced HEP.  Baseline:  Goal status: on going  2.  Pt to demonstrate at least 3/5 pelvic floor strength and ability to fully contract for 8 s and relax post contraction to for improved pelvic stability and decreased strain at pelvic floor/ decrease leakage.  Baseline:  Goal status: on going  3.  Pt to demonstrate improved coordination of pelvic floor and breathing mechanics with body weight squat with appropriate synergistic patterns to decrease pain and leakage at least 75% of the time.    Baseline:  Goal status: on going  4.  Pt to be I with relaxation techniques to decreased straining with attempts to urinate. Baseline:  Goal status: INITIAL  5.  Pt to report ability to void fully at least 75% of time for decreased feeling of retention.  Baseline:  Goal  status: on going  PLAN:  PT FREQUENCY: 1x/week  PT DURATION:  8 sessions  PLANNED INTERVENTIONS: Therapeutic exercises, Therapeutic activity, Neuromuscular re-education, Patient/Family education, Self Care, Joint mobilization, Aquatic Therapy, Dry Needling, Electrical stimulation, Spinal mobilization, Cryotherapy, Moist heat, scar mobilization, Taping, Biofeedback, and Manual therapy  PLAN FOR NEXT SESSION: internal as needed and pt consents, core and hip strengthening and pelvic floor, relaxation techniques, voiding mechanics, breathing mechanics, pelvic mobility, pressure management.  Otelia Sergeant, PT, DPT 07/22/243:54 PM

## 2023-04-03 ENCOUNTER — Ambulatory Visit: Payer: Medicare Other | Admitting: Physical Therapy

## 2023-04-03 DIAGNOSIS — R293 Abnormal posture: Secondary | ICD-10-CM

## 2023-04-03 DIAGNOSIS — R279 Unspecified lack of coordination: Secondary | ICD-10-CM | POA: Diagnosis not present

## 2023-04-03 DIAGNOSIS — M6281 Muscle weakness (generalized): Secondary | ICD-10-CM

## 2023-04-03 DIAGNOSIS — M62838 Other muscle spasm: Secondary | ICD-10-CM | POA: Diagnosis not present

## 2023-04-03 NOTE — Therapy (Signed)
OUTPATIENT PHYSICAL THERAPY FEMALE PELVIC TREATMENT   Patient Name: [name] [name] MRN: [id number] DOB:1935-01-28, 88 y.o., female Today's Date: 04/03/2023    END OF SESSION:  PT End of Session - 04/03/23 1234     Visit Number 6    Date for PT Re-Evaluation 05/28/23    Authorization Type MCR    Authorization Time Period 05/28/23    Progress Note Due on Visit 15   progress note done at visit 5 with [facility] recert   PT Start Time 1234   arrival time   PT Stop Time 1313    PT Time Calculation (min) 39 min    Activity Tolerance Patient tolerated treatment well    Behavior During Therapy [facility] for tasks assessed/performed              Past Medical History:  Diagnosis Date   Allergic angioedema    from shellfish and tree nuts   Chest pain 2000 and 2001   negative cardiac workup   Complication of anesthesia    Diverticulosis    Fibrocystic breast disease    Hyperlipidemia    Hypertension    Irritable bowel syndrome    Perirectal cyst    ?lymphocoele   PMR (polymyalgia rheumatica) (HCC) 2011   PONV (postoperative nausea and vomiting)    Seborrheic dermatitis    Squamous cell skin cancer    X1, [facility]   Thyroid disease    post op hypothyroidism; TSH goal would be sup;pressive   Past Surgical History:  Procedure Laterality Date   BILIARY DILATION  04/23/2021   Procedure: BILIARY DILATION;  Surgeon: Lynann Bologna, MD;  Location: [facility];  Service: Endoscopy;;   CHOLECYSTECTOMY N/A 04/24/2021   Procedure: LAPAROSCOPIC CHOLECYSTECTOMY;  Surgeon: Andria Meuse, MD;  Location: [facility];  Service: General;  Laterality: N/A;  90   COLONOSCOPY  2004   per Dr. Juanda Chance, clear but incomplete; Barium study added, repeat in 10 yrs    DILATION AND CURETTAGE OF UTERUS     ERCP N/A 04/23/2021   Procedure: ENDOSCOPIC RETROGRADE CHOLANGIOPANCREATOGRAPHY (ERCP);  Surgeon: Lynann Bologna, MD;  Location: [facility];  Service: Endoscopy;  Laterality: N/A;    ESOPHAGOGASTRODUODENOSCOPY (EGD) WITH PROPOFOL N/A 07/21/2018   Procedure: ESOPHAGOGASTRODUODENOSCOPY (EGD) WITH PROPOFOL;  Surgeon: Lynann Bologna, MD;  Location: [facility];  Service: Endoscopy;  Laterality: N/A;   ESOPHAGOGASTRODUODENOSCOPY (EGD) WITH PROPOFOL N/A 04/23/2021   Procedure: ESOPHAGOGASTRODUODENOSCOPY (EGD) WITH PROPOFOL;  Surgeon: Lynann Bologna, MD;  Location: [facility];  Service: Endoscopy;  Laterality: N/A;   exporatory surgery for rectal mass  1986   peri rectal cyst   FOREIGN BODY REMOVAL  07/21/2018   Procedure: FOREIGN BODY REMOVAL;  Surgeon: Lynann Bologna, MD;  Location: [facility];  Service: Endoscopy;;   REMOVAL OF STONES  04/23/2021   Procedure: REMOVAL OF STONES;  Surgeon: Lynann Bologna, MD;  Location: [facility];  Service: Endoscopy;;   SPHINCTEROTOMY  04/23/2021   Procedure: Dennison Mascot;  Surgeon: Lynann Bologna, MD;  Location: [facility];  Service: Endoscopy;;   THYROIDECTOMY  1988   S/P radiation previously seen by Levonne Hubert, MD @ [facility], now followed by Dr. Uvaldo Rising   TONSILLECTOMY  1972   Patient Active Problem List   Diagnosis Date Noted   Incomplete bladder emptying 12/23/2022   GERD (gastroesophageal reflux disease) 08/02/2022   Pelvic floor dysfunction in female 08/02/2022   Follicular thyroid carcinoma (HCC) 07/22/2022   Depression with anxiety 06/18/2021   Choledocholithiasis 04/20/2021   Gallstone pancreatitis 04/20/2021  DERMATITIS, SEBORRHEIC 07/14/2010   Squamous cell skin cancer 07/14/2010   Polymyalgia rheumatica (HCC) 06/23/2010   POSTMENOPAUSAL SYNDROME 06/03/2010   ARTHRALGIA 06/03/2010   Hypothyroidism, postsurgical 06/16/2008   DIVERTICULOSIS, COLON 06/16/2008   HYPERLIPIDEMIA 06/29/2007   Essential hypertension 01/05/2007   IBS 01/05/2007   FIBROCYSTIC BREAST DISEASE 01/05/2007    PCP: Nelwyn Salisbury, MD  REFERRING PROVIDER: Marguerita Beards, MD  REFERRING DIAG: R33.9 (ICD-10-CM) - Incomplete bladder  emptying  THERAPY DIAG:  Muscle weakness (generalized)  Abnormal posture  Other muscle spasm  Rationale for Evaluation and Treatment: Rehabilitation  ONSET DATE: 2023  SUBJECTIVE:                                                                                                                                                                                           SUBJECTIVE STATEMENT: Pt reports manual feels like it helped, "I really feel more relaxed and able to start urine more". Not getting up as often at night now - usually 3x at night instead of 10. And emptying slightly larger amounts now.   Fluid intake: Yes: water - limits due to retention ~20 oz after paying closer attention to it    PAIN:  Are you having pain? No   PRECAUTIONS: None  WEIGHT BEARING RESTRICTIONS: No  FALLS:  Has patient fallen in last 6 months? No  LIVING ENVIRONMENT: Lives with: lives alone Lives in: House/apartment   OCCUPATION: retired   PLOF: Independent  PATIENT GOALS: to have improved urine habits  PERTINENT HISTORY:  Emptying was dysfunctional with a elevated PVR ( ), a sustained detrusor contraction present,  abdominal straining present, dyssynergic urethral sphincter activity on EMG.   Allergic angioedema, Chest pain (2000 and 2001), Complication of anesthesia, Diverticulosis, Fibrocystic breast disease, Hyperlipidemia, Hypertension, Irritable bowel syndrome, Perirectal cyst, PMR (polymyalgia rheumatica) (2011), PONV (postoperative nausea and vomiting), Seborrheic dermatitis, Squamous cell skin cancer, and Thyroid disease  Sexual abuse: No  BOWEL MOVEMENT: Pain with bowel movement: No Type of bowel movement:Type (Bristol Stool Scale) 4, Frequency daily to every other day, and Strain No Fully empty rectum: Yes:   Leakage: No Pads: No Fiber supplement: No  URINATION: Pain with urination: No Fully empty bladder: No Stream: Weak Urgency: No - only with laying down then  goes to the bathroom then can empty a little better Frequency: 8 total - 6 -10at night Leakage:  none Pads: No  INTERCOURSE: Pain with intercourse:  not active  - wasn't painful however after an infection had a series over 9 years with urethral stretching and had a surgery where the doctor "cleaned out the head of the  urethra" then was having no pain. Was diagnosed with chronic cystitis and was treated for that and didn't have pain.  Ability to have vaginal penetration:  Yes:   Climax: not painful Marinoff Scale: 0/3  PREGNANCY: Vaginal deliveries 2 Tearing Yes: with the first one had severe tearing with forceps and large episiotomy  C-section deliveries 0 Currently pregnant No  PROLAPSE: Cystocele per chart review   OBJECTIVE:   DIAGNOSTIC FINDINGS:  Urodynamic Impression:  1. Sensation was normal; capacity was normal 2. Stress Incontinence was not demonstrated at normal pressures; 3. Detrusor Overactivity was not demonstrated. 4. Emptying was dysfunctional with a elevated PVR ( ), a sustained detrusor contraction present,  abdominal straining present, dyssynergic urethral sphincter activity on EMG.  COGNITION: Overall cognitive status: Within functional limits for tasks assessed     SENSATION: Light touch: Appears intact Proprioception: Appears intact  MUSCLE LENGTH: Hamstrings and adductors limited by 25%   POSTURE: rounded shoulders, forward head, and posterior pelvic tilt   LUMBARAROM/PROM:  A/PROM A/PROM  eval  Flexion Limited by 25%  Extension WFL  Right lateral flexion Limited by 25%  Left lateral flexion Limited by 25%  Right rotation Limited by 25%  Left rotation Limited by 25%   (Blank rows = not tested)  LOWER EXTREMITY ROM:  WFL  LOWER EXTREMITY MMT:  Hips bil grossly 4/5, knees 5/5  PALPATION:   General  no TTP throughout abdomen but tension noted throughout lower abdomen                External Perineal Exam no TTP, widening of  vaginal opening                             Internal Pelvic Floor possible grade 2-3 anterior wall laxity noted in hooklying at rest possibly 2 and with cough possible 3.   Patient confirms identification and approves PT to assess internal pelvic floor and treatment Yes  PELVIC MMT:   MMT eval  Vaginal 1/5 improved to 2/5 with quick taps, 7s, 4 reps - however required compensation to hold contraction from glutes/abdominals/thighs. And relaxation noted internally however difficult to identify if this was from compensatory muscle movement or true pelvic floor   Internal Anal Sphincter   External Anal Sphincter   Puborectalis   Diastasis Recti   (Blank rows = not tested)        TONE: Decreased in hooklying   PROLAPSE: Possible grade 2-3 anterior laxity in hooklying worsening with cough  TODAY'S TREATMENT:                                                                                                                              DATE:   04/03/23  Manual: Patient consented to internal pelvic floor treatment vaginally this date. Manual at urethra with tension noted bil, no pain per pt and gentle mobility stretching complete in bil sides. Pt tolerated  well. Good Release noted and pt reported she feels like this has been helpful. External manual work at low abdomen at suprapubic region. Pt does continue to have tension here with decreased fascial mobility. Direct fascial release technique completed with overall improved mobility compared to previous session at start of manual work but post treatment today felt much better. Pt states she has been able to feel like she can start urine flow easier in the past week and decreased times getting up at night.     PATIENT EDUCATION:  Education details: [id number] Person educated: Patient Education method: Explanation, Demonstration, Tactile cues, Verbal cues, and Handouts Education comprehension: verbalized understanding and returned  demonstration  HOME EXERCISE PROGRAM: [id number]  ASSESSMENT:  CLINICAL IMPRESSION: Patient presents for treatment, session focused on manual at abdomen for decreased tension over bladder and vaginal internal manual work. Pt tolerated well and denied pain, reports she felt a lot better at end of session and less tension felt per pt. pt reports greatly improved symptoms for a few days after manual work in clinic, not total improvement but able to tell a difference and empty urine better during the day. Does continue to have tension at abdomen and at urethra and reports it slow returns after a few day after session until next session. Pt would benefit from additional PT to further address deficits.    OBJECTIVE IMPAIRMENTS: decreased coordination, decreased endurance, decreased mobility, decreased strength, increased fascial restrictions, impaired flexibility, improper body mechanics, postural dysfunction, and pain.   ACTIVITY LIMITATIONS: carrying, lifting, squatting, and continence  PARTICIPATION LIMITATIONS: community activity  PERSONAL FACTORS: Time since onset of injury/illness/exacerbation and 1 comorbidity: x2 vaginal births one with severe tearing  are also affecting patient's functional outcome.   REHAB POTENTIAL: Good  CLINICAL DECISION MAKING: Stable/uncomplicated  EVALUATION COMPLEXITY: Low   GOALS: Goals reviewed with patient? Yes  SHORT TERM GOALS: Target date: 02/14/23  Pt to be I with HEP.  Baseline: Goal status: MET  2.  Pt to demonstrate at least 3/5 pelvic floor strength for improved pelvic stability and decreased strain at pelvic floor/ decrease leakage.  Baseline:  Goal status: on going  3.  Pt to be I with pressure management techniques for decreased strain at pelvic floor and prolapse Baseline:  Goal status: on going  4.  Pt to be I with breathing mechanics and voiding mechanics to decrease strain at pelvic floor and prolapse. Baseline:  Goal status:  MET   LONG TERM GOALS: Target date: 04/26/23  Pt to be I with advanced HEP.  Baseline:  Goal status: on going  2.  Pt to demonstrate at least 3/5 pelvic floor strength and ability to fully contract for 8 s and relax post contraction to for improved pelvic stability and decreased strain at pelvic floor/ decrease leakage.  Baseline:  Goal status: on going  3.  Pt to demonstrate improved coordination of pelvic floor and breathing mechanics with body weight squat with appropriate synergistic patterns to decrease pain and leakage at least 75% of the time.    Baseline:  Goal status: on going  4.  Pt to be I with relaxation techniques to decreased straining with attempts to urinate. Baseline:  Goal status: INITIAL  5.  Pt to report ability to void fully at least 75% of time for decreased feeling of retention.  Baseline:  Goal status: on going  PLAN:  PT FREQUENCY: 1x/week  PT DURATION:  8 sessions  PLANNED INTERVENTIONS: Therapeutic exercises, Therapeutic activity, Neuromuscular re-education, Patient/Family  education, Self Care, Joint mobilization, Aquatic Therapy, Dry Needling, Electrical stimulation, Spinal mobilization, Cryotherapy, Moist heat, scar mobilization, Taping, Biofeedback, and Manual therapy  PLAN FOR NEXT SESSION: internal as needed and pt consents, core and hip strengthening and pelvic floor, relaxation techniques, voiding mechanics, breathing mechanics, pelvic mobility, pressure management.  Otelia Sergeant, PT, DPT 07/29/242:47 PM

## 2023-04-10 ENCOUNTER — Ambulatory Visit: Payer: Medicare Other | Attending: Obstetrics and Gynecology | Admitting: Physical Therapy

## 2023-04-10 DIAGNOSIS — R293 Abnormal posture: Secondary | ICD-10-CM | POA: Diagnosis not present

## 2023-04-10 DIAGNOSIS — M6281 Muscle weakness (generalized): Secondary | ICD-10-CM | POA: Insufficient documentation

## 2023-04-10 DIAGNOSIS — R279 Unspecified lack of coordination: Secondary | ICD-10-CM | POA: Diagnosis not present

## 2023-04-10 DIAGNOSIS — M62838 Other muscle spasm: Secondary | ICD-10-CM | POA: Insufficient documentation

## 2023-04-10 NOTE — Therapy (Signed)
OUTPATIENT PHYSICAL THERAPY FEMALE PELVIC TREATMENT   Patient Name: [name] [name] MRN: [id number] DOB:[birth year], 88 y.o., female Today's Date: 04/10/2023    END OF SESSION:  PT End of Session - 04/10/23 1230     Visit Number 7    Date for PT Re-Evaluation 05/28/23    Authorization Type MCR    Authorization Time Period 05/28/23    Progress Note Due on Visit 15   progress note done at visit 5 with [facility] recert   PT Start Time 1145    PT Stop Time 1227    PT Time Calculation (min) 42 min    Activity Tolerance Patient tolerated treatment well    Behavior During Therapy [facility] for tasks assessed/performed               Past Medical History:  Diagnosis Date   Allergic angioedema    from shellfish and tree nuts   Chest pain 2000 and 2001   negative cardiac workup   Complication of anesthesia    Diverticulosis    Fibrocystic breast disease    Hyperlipidemia    Hypertension    Irritable bowel syndrome    Perirectal cyst    ?lymphocoele   PMR (polymyalgia rheumatica) (HCC) 2011   PONV (postoperative nausea and vomiting)    Seborrheic dermatitis    Squamous cell skin cancer    X1, [facility]   Thyroid disease    post op hypothyroidism; TSH goal would be sup;pressive   Past Surgical History:  Procedure Laterality Date   BILIARY DILATION  04/23/2021   Procedure: BILIARY DILATION;  Surgeon: Lynann Bologna, MD;  Location: [facility];  Service: Endoscopy;;   CHOLECYSTECTOMY N/A 04/24/2021   Procedure: LAPAROSCOPIC CHOLECYSTECTOMY;  Surgeon: Andria Meuse, MD;  Location: [facility];  Service: General;  Laterality: N/A;  90   COLONOSCOPY  2004   per Dr. Juanda Chance, clear but incomplete; Barium study added, repeat in 10 yrs    DILATION AND CURETTAGE OF UTERUS     ERCP N/A 04/23/2021   Procedure: ENDOSCOPIC RETROGRADE CHOLANGIOPANCREATOGRAPHY (ERCP);  Surgeon: Lynann Bologna, MD;  Location: [facility];  Service: Endoscopy;  Laterality: N/A;   ESOPHAGOGASTRODUODENOSCOPY (EGD) WITH  PROPOFOL N/A 07/21/2018   Procedure: ESOPHAGOGASTRODUODENOSCOPY (EGD) WITH PROPOFOL;  Surgeon: Lynann Bologna, MD;  Location: [facility];  Service: Endoscopy;  Laterality: N/A;   ESOPHAGOGASTRODUODENOSCOPY (EGD) WITH PROPOFOL N/A 04/23/2021   Procedure: ESOPHAGOGASTRODUODENOSCOPY (EGD) WITH PROPOFOL;  Surgeon: Lynann Bologna, MD;  Location: [facility];  Service: Endoscopy;  Laterality: N/A;   exporatory surgery for rectal mass  1986   peri rectal cyst   FOREIGN BODY REMOVAL  07/21/2018   Procedure: FOREIGN BODY REMOVAL;  Surgeon: Lynann Bologna, MD;  Location: [facility];  Service: Endoscopy;;   REMOVAL OF STONES  04/23/2021   Procedure: REMOVAL OF STONES;  Surgeon: Lynann Bologna, MD;  Location: [facility];  Service: Endoscopy;;   SPHINCTEROTOMY  04/23/2021   Procedure: Dennison Mascot;  Surgeon: Lynann Bologna, MD;  Location: [facility];  Service: Endoscopy;;   THYROIDECTOMY  1988   S/P radiation previously seen by Levonne Hubert, MD @ [facility], now followed by Dr. Uvaldo Rising   TONSILLECTOMY  1972   Patient Active Problem List   Diagnosis Date Noted   Incomplete bladder emptying 12/23/2022   GERD (gastroesophageal reflux disease) 08/02/2022   Pelvic floor dysfunction in female 08/02/2022   Follicular thyroid carcinoma (HCC) 07/22/2022   Depression with anxiety 06/18/2021   Choledocholithiasis 04/20/2021   Gallstone pancreatitis 04/20/2021  DERMATITIS, SEBORRHEIC 07/14/2010   Squamous cell skin cancer 07/14/2010   Polymyalgia rheumatica (HCC) 06/23/2010   POSTMENOPAUSAL SYNDROME 06/03/2010   ARTHRALGIA 06/03/2010   Hypothyroidism, postsurgical 06/16/2008   DIVERTICULOSIS, COLON 06/16/2008   HYPERLIPIDEMIA 06/29/2007   Essential hypertension 01/05/2007   IBS 01/05/2007   FIBROCYSTIC BREAST DISEASE 01/05/2007    PCP: Nelwyn Salisbury, MD  REFERRING PROVIDER: Marguerita Beards, MD  REFERRING DIAG: R33.9 (ICD-10-CM) - Incomplete bladder emptying  THERAPY DIAG:  Other  muscle spasm  Muscle weakness (generalized)  Abnormal posture  Rationale for Evaluation and Treatment: Rehabilitation  ONSET DATE: 2023  SUBJECTIVE:                                                                                                                                                                                           SUBJECTIVE STATEMENT: Had a lot of improvement since last session - reports "fine during the past week" has been trying to urinate more during the day and this has been helping with not getting up as much at night ~3x. Did not need to take AZO at all until last night and had increased burning feeling and unable to rest due to getting up to try to pee so much. Feels much more relaxed and more able to empty urine.   Fluid intake: Yes: water - limits due to retention ~20 oz after paying closer attention to it    PAIN:  Are you having pain? No   PRECAUTIONS: None  WEIGHT BEARING RESTRICTIONS: No  FALLS:  Has patient fallen in last 6 months? No  LIVING ENVIRONMENT: Lives with: lives alone Lives in: House/apartment   OCCUPATION: retired   PLOF: Independent  PATIENT GOALS: to have improved urine habits  PERTINENT HISTORY:  Emptying was dysfunctional with a elevated PVR ( ), a sustained detrusor contraction present,  abdominal straining present, dyssynergic urethral sphincter activity on EMG.   Allergic angioedema, Chest pain (2000 and 2001), Complication of anesthesia, Diverticulosis, Fibrocystic breast disease, Hyperlipidemia, Hypertension, Irritable bowel syndrome, Perirectal cyst, PMR (polymyalgia rheumatica) (2011), PONV (postoperative nausea and vomiting), Seborrheic dermatitis, Squamous cell skin cancer, and Thyroid disease  Sexual abuse: No  BOWEL MOVEMENT: Pain with bowel movement: No Type of bowel movement:Type (Bristol Stool Scale) 4, Frequency daily to every other day, and Strain No Fully empty rectum: Yes:   Leakage: No Pads:  No Fiber supplement: No  URINATION: Pain with urination: No Fully empty bladder: No Stream: Weak Urgency: No - only with laying down then goes to the bathroom then can empty a little better Frequency: 8 total - 6 -10at night Leakage:  none Pads: No  INTERCOURSE: Pain with intercourse:  not active  - wasn't painful however after an infection had a series over 9 years with urethral stretching and had a surgery where the doctor "cleaned out the head of the urethra" then was having no pain. Was diagnosed with chronic cystitis and was treated for that and didn't have pain.  Ability to have vaginal penetration:  Yes:   Climax: not painful Marinoff Scale: 0/3  PREGNANCY: Vaginal deliveries 2 Tearing Yes: with the first one had severe tearing with forceps and large episiotomy  C-section deliveries 0 Currently pregnant No  PROLAPSE: Cystocele per chart review   OBJECTIVE:   DIAGNOSTIC FINDINGS:  Urodynamic Impression:  1. Sensation was normal; capacity was normal 2. Stress Incontinence was not demonstrated at normal pressures; 3. Detrusor Overactivity was not demonstrated. 4. Emptying was dysfunctional with a elevated PVR ( ), a sustained detrusor contraction present,  abdominal straining present, dyssynergic urethral sphincter activity on EMG.  COGNITION: Overall cognitive status: Within functional limits for tasks assessed     SENSATION: Light touch: Appears intact Proprioception: Appears intact  MUSCLE LENGTH: Hamstrings and adductors limited by 25%   POSTURE: rounded shoulders, forward head, and posterior pelvic tilt   LUMBARAROM/PROM:  A/PROM A/PROM  eval  Flexion Limited by 25%  Extension WFL  Right lateral flexion Limited by 25%  Left lateral flexion Limited by 25%  Right rotation Limited by 25%  Left rotation Limited by 25%   (Blank rows = not tested)  LOWER EXTREMITY ROM:  WFL  LOWER EXTREMITY MMT:  Hips bil grossly 4/5, knees  5/5  PALPATION:   General  no TTP throughout abdomen but tension noted throughout lower abdomen                External Perineal Exam no TTP, widening of vaginal opening                             Internal Pelvic Floor possible grade 2-3 anterior wall laxity noted in hooklying at rest possibly 2 and with cough possible 3.   Patient confirms identification and approves PT to assess internal pelvic floor and treatment Yes  PELVIC MMT:   MMT eval  Vaginal 1/5 improved to 2/5 with quick taps, 7s, 4 reps - however required compensation to hold contraction from glutes/abdominals/thighs. And relaxation noted internally however difficult to identify if this was from compensatory muscle movement or true pelvic floor   Internal Anal Sphincter   External Anal Sphincter   Puborectalis   Diastasis Recti   (Blank rows = not tested)        TONE: Decreased in hooklying   PROLAPSE: Possible grade 2-3 anterior laxity in hooklying worsening with cough  TODAY'S TREATMENT:  DATE:   04/10/23  Manual:  External manual work at low abdomen at suprapubic region. Pt does continue to have tension here with decreased fascial mobility. Direct fascial release technique completed with overall improved mobility compared to previous session at start of manual work but post treatment today felt much better. Did work today at scar mobility as pt has large vertical scar with scar tissue pulling upward from pubic bone and hands on as well as suction cup used here for improved tissue mobility. Pt reported this did feel tight with stretching but with release noted did have improvement and reports feeling less tense.  Pt educated on diaphragmatic breathing and possible use of feminine moisturizer at vulva for tissue mobility during manual work. Pt denied questions.     PATIENT EDUCATION:   Education details: [id number] Person educated: Patient Education method: Explanation, Demonstration, Tactile cues, Verbal cues, and Handouts Education comprehension: verbalized understanding and returned demonstration  HOME EXERCISE PROGRAM: [id number]  ASSESSMENT:  CLINICAL IMPRESSION: Patient presents for treatment, session focused on manual at abdomen for decreased tension over bladder. Pt tolerated well and denied pain, reports she felt a lot better at end of session and less tension felt per pt. pt reports greatly improved symptoms all of this past week after manual work in clinic, emptying easier and more often during the day and only getting up around 3x during the night with greatly improved ability to sleep. Does continue to have tension at abdomen and at urethra and reports it slow returns after several days after session until next session. Pt would benefit from additional PT to further address deficits.  Plan to update HEP next session pending progress with only abdominal mobility this session without internal work.   OBJECTIVE IMPAIRMENTS: decreased coordination, decreased endurance, decreased mobility, decreased strength, increased fascial restrictions, impaired flexibility, improper body mechanics, postural dysfunction, and pain.   ACTIVITY LIMITATIONS: carrying, lifting, squatting, and continence  PARTICIPATION LIMITATIONS: community activity  PERSONAL FACTORS: Time since onset of injury/illness/exacerbation and 1 comorbidity: x2 vaginal births one with severe tearing  are also affecting patient's functional outcome.   REHAB POTENTIAL: Good  CLINICAL DECISION MAKING: Stable/uncomplicated  EVALUATION COMPLEXITY: Low   GOALS: Goals reviewed with patient? Yes  SHORT TERM GOALS: Target date: 02/14/23  Pt to be I with HEP.  Baseline: Goal status: MET  2.  Pt to demonstrate at least 3/5 pelvic floor strength for improved pelvic stability and decreased strain at pelvic  floor/ decrease leakage.  Baseline:  Goal status: on going  3.  Pt to be I with pressure management techniques for decreased strain at pelvic floor and prolapse Baseline:  Goal status: on going  4.  Pt to be I with breathing mechanics and voiding mechanics to decrease strain at pelvic floor and prolapse. Baseline:  Goal status: MET   LONG TERM GOALS: Target date: 04/26/23  Pt to be I with advanced HEP.  Baseline:  Goal status: on going  2.  Pt to demonstrate at least 3/5 pelvic floor strength and ability to fully contract for 8 s and relax post contraction to for improved pelvic stability and decreased strain at pelvic floor/ decrease leakage.  Baseline:  Goal status: on going  3.  Pt to demonstrate improved coordination of pelvic floor and breathing mechanics with body weight squat with appropriate synergistic patterns to decrease pain and leakage at least 75% of the time.    Baseline:  Goal status: on going  4.  Pt to be I with  relaxation techniques to decreased straining with attempts to urinate. Baseline:  Goal status: INITIAL  5.  Pt to report ability to void fully at least 75% of time for decreased feeling of retention.  Baseline:  Goal status: on going  PLAN:  PT FREQUENCY: 1x/week  PT DURATION:  8 sessions  PLANNED INTERVENTIONS: Therapeutic exercises, Therapeutic activity, Neuromuscular re-education, Patient/Family education, Self Care, Joint mobilization, Aquatic Therapy, Dry Needling, Electrical stimulation, Spinal mobilization, Cryotherapy, Moist heat, scar mobilization, Taping, Biofeedback, and Manual therapy  PLAN FOR NEXT SESSION: internal as needed and pt consents, core and hip strengthening and pelvic floor, relaxation techniques, voiding mechanics, breathing mechanics, pelvic mobility, pressure management.  Otelia Sergeant, PT, DPT 04/10/2411:34 PM

## 2023-04-17 ENCOUNTER — Ambulatory Visit: Payer: Medicare Other | Admitting: Physical Therapy

## 2023-04-17 DIAGNOSIS — R279 Unspecified lack of coordination: Secondary | ICD-10-CM | POA: Diagnosis not present

## 2023-04-17 DIAGNOSIS — M6281 Muscle weakness (generalized): Secondary | ICD-10-CM

## 2023-04-17 DIAGNOSIS — M62838 Other muscle spasm: Secondary | ICD-10-CM | POA: Diagnosis not present

## 2023-04-17 DIAGNOSIS — R293 Abnormal posture: Secondary | ICD-10-CM | POA: Diagnosis not present

## 2023-04-17 NOTE — Therapy (Signed)
OUTPATIENT PHYSICAL THERAPY FEMALE PELVIC TREATMENT   Patient Name: [name] [name] MRN: [id number] DOB:15-Sep-1934, 88 y.o., female Today's Date: 04/17/2023    END OF SESSION:  PT End of Session - 04/17/23 1235     Visit Number 8    Date for PT Re-Evaluation 05/28/23    Authorization Type MCR    Authorization Time Period 05/28/23    Progress Note Due on Visit 15   progress note done at visit 5 with MCR recert   PT Start Time 1231    PT Stop Time 1310    PT Time Calculation (min) 39 min    Activity Tolerance Patient tolerated treatment well    Behavior During Therapy [facility] for tasks assessed/performed                Past Medical History:  Diagnosis Date   Allergic angioedema    from shellfish and tree nuts   Chest pain 2000 and 2001   negative cardiac workup   Complication of anesthesia    Diverticulosis    Fibrocystic breast disease    Hyperlipidemia    Hypertension    Irritable bowel syndrome    Perirectal cyst    ?lymphocoele   PMR (polymyalgia rheumatica) (HCC) 2011   PONV (postoperative nausea and vomiting)    Seborrheic dermatitis    Squamous cell skin cancer    X1, [facility]   Thyroid disease    post op hypothyroidism; TSH goal would be sup;pressive   Past Surgical History:  Procedure Laterality Date   BILIARY DILATION  04/23/2021   Procedure: BILIARY DILATION;  Surgeon: Lynann Bologna, MD;  Location: [facility];  Service: Endoscopy;;   CHOLECYSTECTOMY N/A 04/24/2021   Procedure: LAPAROSCOPIC CHOLECYSTECTOMY;  Surgeon: Andria Meuse, MD;  Location: [facility];  Service: General;  Laterality: N/A;  90   COLONOSCOPY  2004   per Dr. Juanda Chance, clear but incomplete; Barium study added, repeat in 10 yrs    DILATION AND CURETTAGE OF UTERUS     ERCP N/A 04/23/2021   Procedure: ENDOSCOPIC RETROGRADE CHOLANGIOPANCREATOGRAPHY (ERCP);  Surgeon: Lynann Bologna, MD;  Location: [facility];  Service: Endoscopy;  Laterality: N/A;   ESOPHAGOGASTRODUODENOSCOPY (EGD)  WITH PROPOFOL N/A 07/21/2018   Procedure: ESOPHAGOGASTRODUODENOSCOPY (EGD) WITH PROPOFOL;  Surgeon: Lynann Bologna, MD;  Location: [facility];  Service: Endoscopy;  Laterality: N/A;   ESOPHAGOGASTRODUODENOSCOPY (EGD) WITH PROPOFOL N/A 04/23/2021   Procedure: ESOPHAGOGASTRODUODENOSCOPY (EGD) WITH PROPOFOL;  Surgeon: Lynann Bologna, MD;  Location: [facility];  Service: Endoscopy;  Laterality: N/A;   exporatory surgery for rectal mass  1986   peri rectal cyst   FOREIGN BODY REMOVAL  07/21/2018   Procedure: FOREIGN BODY REMOVAL;  Surgeon: Lynann Bologna, MD;  Location: [facility];  Service: Endoscopy;;   REMOVAL OF STONES  04/23/2021   Procedure: REMOVAL OF STONES;  Surgeon: Lynann Bologna, MD;  Location: [facility];  Service: Endoscopy;;   SPHINCTEROTOMY  04/23/2021   Procedure: Dennison Mascot;  Surgeon: Lynann Bologna, MD;  Location: [facility];  Service: Endoscopy;;   THYROIDECTOMY  1988   S/P radiation previously seen by Levonne Hubert, MD @ [facility], now followed by Dr. Uvaldo Rising   TONSILLECTOMY  1972   Patient Active Problem List   Diagnosis Date Noted   Incomplete bladder emptying 12/23/2022   GERD (gastroesophageal reflux disease) 08/02/2022   Pelvic floor dysfunction in female 08/02/2022   Follicular thyroid carcinoma (HCC) 07/22/2022   Depression with anxiety 06/18/2021   Choledocholithiasis 04/20/2021   Gallstone pancreatitis 04/20/2021  DERMATITIS, SEBORRHEIC 07/14/2010   Squamous cell skin cancer 07/14/2010   Polymyalgia rheumatica (HCC) 06/23/2010   POSTMENOPAUSAL SYNDROME 06/03/2010   ARTHRALGIA 06/03/2010   Hypothyroidism, postsurgical 06/16/2008   DIVERTICULOSIS, COLON 06/16/2008   HYPERLIPIDEMIA 06/29/2007   Essential hypertension 01/05/2007   IBS 01/05/2007   FIBROCYSTIC BREAST DISEASE 01/05/2007    PCP: Nelwyn Salisbury, MD  REFERRING PROVIDER: Marguerita Beards, MD  REFERRING DIAG: R33.9 (ICD-10-CM) - Incomplete bladder emptying  THERAPY DIAG:   Other muscle spasm  Muscle weakness (generalized)  Abnormal posture  Rationale for Evaluation and Treatment: Rehabilitation  ONSET DATE: 2023  SUBJECTIVE:                                                                                                                                                                                           SUBJECTIVE STATEMENT: Has tried feminine moisturizers nightly and noticed only getting up 2-3x per night, ran out last night and was up 9x per pt.  Has continued to have improvement with symptoms with urinating more at home during the day and has been doing self massage at low abdomen and notes this helps her relax more.  Does not attempt to urinate in public reporting she doesn't feel relaxed and doesn't usually empty a lot.   Fluid intake: Yes: water - limits due to retention ~20 oz after paying closer attention to it    PAIN:  Are you having pain? No   PRECAUTIONS: None  WEIGHT BEARING RESTRICTIONS: No  FALLS:  Has patient fallen in last 6 months? No  LIVING ENVIRONMENT: Lives with: lives alone Lives in: House/apartment   OCCUPATION: retired   PLOF: Independent  PATIENT GOALS: to have improved urine habits  PERTINENT HISTORY:  Emptying was dysfunctional with a elevated PVR ( ), a sustained detrusor contraction present,  abdominal straining present, dyssynergic urethral sphincter activity on EMG.   Allergic angioedema, Chest pain (2000 and 2001), Complication of anesthesia, Diverticulosis, Fibrocystic breast disease, Hyperlipidemia, Hypertension, Irritable bowel syndrome, Perirectal cyst, PMR (polymyalgia rheumatica) (2011), PONV (postoperative nausea and vomiting), Seborrheic dermatitis, Squamous cell skin cancer, and Thyroid disease  Sexual abuse: No  BOWEL MOVEMENT: Pain with bowel movement: No Type of bowel movement:Type (Bristol Stool Scale) 4, Frequency daily to every other day, and Strain No Fully empty rectum: Yes:    Leakage: No Pads: No Fiber supplement: No  URINATION: Pain with urination: No Fully empty bladder: No Stream: Weak Urgency: No - only with laying down then goes to the bathroom then can empty a little better Frequency: 8 total - 6 -10at night Leakage:  none Pads: No  INTERCOURSE: Pain with  intercourse:  not active  - wasn't painful however after an infection had a series over 9 years with urethral stretching and had a surgery where the doctor "cleaned out the head of the urethra" then was having no pain. Was diagnosed with chronic cystitis and was treated for that and didn't have pain.  Ability to have vaginal penetration:  Yes:   Climax: not painful Marinoff Scale: 0/3  PREGNANCY: Vaginal deliveries 2 Tearing Yes: with the first one had severe tearing with forceps and large episiotomy  C-section deliveries 0 Currently pregnant No  PROLAPSE: Cystocele per chart review   OBJECTIVE:   DIAGNOSTIC FINDINGS:  Urodynamic Impression:  1. Sensation was normal; capacity was normal 2. Stress Incontinence was not demonstrated at normal pressures; 3. Detrusor Overactivity was not demonstrated. 4. Emptying was dysfunctional with a elevated PVR ( ), a sustained detrusor contraction present,  abdominal straining present, dyssynergic urethral sphincter activity on EMG.  COGNITION: Overall cognitive status: Within functional limits for tasks assessed     SENSATION: Light touch: Appears intact Proprioception: Appears intact  MUSCLE LENGTH: Hamstrings and adductors limited by 25%   POSTURE: rounded shoulders, forward head, and posterior pelvic tilt   LUMBARAROM/PROM:  A/PROM A/PROM  eval  Flexion Limited by 25%  Extension WFL  Right lateral flexion Limited by 25%  Left lateral flexion Limited by 25%  Right rotation Limited by 25%  Left rotation Limited by 25%   (Blank rows = not tested)  LOWER EXTREMITY ROM:  WFL  LOWER EXTREMITY MMT:  Hips bil grossly 4/5,  knees 5/5  PALPATION:   General  no TTP throughout abdomen but tension noted throughout lower abdomen                External Perineal Exam no TTP, widening of vaginal opening                             Internal Pelvic Floor possible grade 2-3 anterior wall laxity noted in hooklying at rest possibly 2 and with cough possible 3.   Patient confirms identification and approves PT to assess internal pelvic floor and treatment Yes  PELVIC MMT:   MMT eval  Vaginal 1/5 improved to 2/5 with quick taps, 7s, 4 reps - however required compensation to hold contraction from glutes/abdominals/thighs. And relaxation noted internally however difficult to identify if this was from compensatory muscle movement or true pelvic floor   Internal Anal Sphincter   External Anal Sphincter   Puborectalis   Diastasis Recti   (Blank rows = not tested)        TONE: Decreased in hooklying   PROLAPSE: Possible grade 2-3 anterior laxity in hooklying worsening with cough  TODAY'S TREATMENT:                                                                                                                              DATE:  04/10/23  Manual:  External manual work at low abdomen at suprapubic region. Pt does continue to have tension here with decreased fascial mobility. Direct fascial release technique completed with overall improved mobility compared to previous session at start of manual work but post treatment today felt much better. Did work today at scar mobility as pt has large vertical scar with scar tissue pulling upward from pubic bone and hands on as well as suction cup used here for improved tissue mobility. Pt reported this did feel tight with stretching but with release noted did have improvement and reports feeling less tense.  Pt educated on diaphragmatic breathing and possible use of feminine moisturizer at vulva for tissue mobility during manual work. Pt denied questions.   04/17/23:  NMRE: focus on  stretches for pelvic drops, relaxation cues, and diaphragmatic breathing for decreased tension at abdomen and pelvic floor Butterfly 2x30s Single knee to chest 2x30s each (left side felt more tight than right) Single leg happy baby 2x30s Manual:  External manual work at low abdomen at suprapubic region. Direct fascial release technique completed with does continue to demonstrate improved mobility compared to last session and demonstrated improvement with ability to complete diaphragmatic breathing, educated on how to complete gentle abdominal massage at home for increased time between sessions with decreased tension or difficulty urinating.     PATIENT EDUCATION:  Education details: [id number] Person educated: Patient Education method: Explanation, Demonstration, Tactile cues, Verbal cues, and Handouts Education comprehension: verbalized understanding and returned demonstration  HOME EXERCISE PROGRAM: [id number]  ASSESSMENT:  CLINICAL IMPRESSION: Patient presents for treatment, session focused on manual at abdomen for decreased tension over bladder and stretching at bil hips for improved pelvic mobility. Pt tolerated well and denied pain, continues to state she feels better at end of session and less tension felt per pt. Does continue to have tension at abdomen and reports it slow returns after several days after session until next session but this is lengthening more and more after each treatment. This week lasted until yesterday. Pt would benefit from additional PT to further address deficits.  OBJECTIVE IMPAIRMENTS: decreased coordination, decreased endurance, decreased mobility, decreased strength, increased fascial restrictions, impaired flexibility, improper body mechanics, postural dysfunction, and pain.   ACTIVITY LIMITATIONS: carrying, lifting, squatting, and continence  PARTICIPATION LIMITATIONS: community activity  PERSONAL FACTORS: Time since onset of injury/illness/exacerbation  and 1 comorbidity: x2 vaginal births one with severe tearing  are also affecting patient's functional outcome.   REHAB POTENTIAL: Good  CLINICAL DECISION MAKING: Stable/uncomplicated  EVALUATION COMPLEXITY: Low   GOALS: Goals reviewed with patient? Yes  SHORT TERM GOALS: Target date: 02/14/23  Pt to be I with HEP.  Baseline: Goal status: MET  2.  Pt to demonstrate at least 3/5 pelvic floor strength for improved pelvic stability and decreased strain at pelvic floor/ decrease leakage.  Baseline:  Goal status: on going  3.  Pt to be I with pressure management techniques for decreased strain at pelvic floor and prolapse Baseline:  Goal status: MET  4.  Pt to be I with breathing mechanics and voiding mechanics to decrease strain at pelvic floor and prolapse. Baseline:  Goal status: MET   LONG TERM GOALS: Target date: 04/26/23  Pt to be I with advanced HEP.  Baseline:  Goal status: on going  2.  Pt to demonstrate at least 3/5 pelvic floor strength and ability to fully contract for 8 s and relax post contraction to for improved pelvic stability and decreased strain at  pelvic floor/ decrease leakage.  Baseline:  Goal status: on going  3.  Pt to demonstrate improved coordination of pelvic floor and breathing mechanics with body weight squat with appropriate synergistic patterns to decrease pain and leakage at least 75% of the time.    Baseline:  Goal status: on going  4.  Pt to be I with relaxation techniques to decreased straining with attempts to urinate. Baseline:  Goal status: MET  5.  Pt to report ability to void fully at least 75% of time for decreased feeling of retention.  Baseline:  Goal status: MET  PLAN:  PT FREQUENCY: 1x/week  PT DURATION:  8 sessions  PLANNED INTERVENTIONS: Therapeutic exercises, Therapeutic activity, Neuromuscular re-education, Patient/Family education, Self Care, Joint mobilization, Aquatic Therapy, Dry Needling, Electrical stimulation,  Spinal mobilization, Cryotherapy, Moist heat, scar mobilization, Taping, Biofeedback, and Manual therapy  PLAN FOR NEXT SESSION: internal as needed and pt consents, core and hip strengthening and pelvic floor, relaxation techniques, voiding mechanics, breathing mechanics, pelvic mobility, pressure management.  Otelia Sergeant, PT, DPT 08/12/243:21 PM

## 2023-04-24 ENCOUNTER — Ambulatory Visit: Payer: Medicare Other | Admitting: Physical Therapy

## 2023-04-24 DIAGNOSIS — R293 Abnormal posture: Secondary | ICD-10-CM | POA: Diagnosis not present

## 2023-04-24 DIAGNOSIS — R279 Unspecified lack of coordination: Secondary | ICD-10-CM | POA: Diagnosis not present

## 2023-04-24 DIAGNOSIS — M6281 Muscle weakness (generalized): Secondary | ICD-10-CM

## 2023-04-24 DIAGNOSIS — M62838 Other muscle spasm: Secondary | ICD-10-CM | POA: Diagnosis not present

## 2023-04-24 NOTE — Patient Instructions (Addendum)

## 2023-04-24 NOTE — Therapy (Signed)
OUTPATIENT PHYSICAL THERAPY FEMALE PELVIC TREATMENT   Patient Name: [name] LASCALA MRN: [id number] DOB:Mar 17, 1935, 88 y.o., female Today's Date: 04/24/2023    END OF SESSION:  PT End of Session - 04/24/23 1151     Visit Number 9    Date for PT Re-Evaluation 05/28/23    Authorization Type MCR    Authorization Time Period 05/28/23    Progress Note Due on Visit 15   progress note done at visit 5 with MCR recert   PT Start Time 1145    PT Stop Time 1230    PT Time Calculation (min) 45 min    Activity Tolerance Patient tolerated treatment well    Behavior During Therapy [facility] for tasks assessed/performed                Past Medical History:  Diagnosis Date   Allergic angioedema    from shellfish and tree nuts   Chest pain 2000 and 2001   negative cardiac workup   Complication of anesthesia    Diverticulosis    Fibrocystic breast disease    Hyperlipidemia    Hypertension    Irritable bowel syndrome    Perirectal cyst    ?lymphocoele   PMR (polymyalgia rheumatica) (HCC) 2011   PONV (postoperative nausea and vomiting)    Seborrheic dermatitis    Squamous cell skin cancer    X1, [facility]   Thyroid disease    post op hypothyroidism; TSH goal would be sup;pressive   Past Surgical History:  Procedure Laterality Date   BILIARY DILATION  04/23/2021   Procedure: BILIARY DILATION;  Surgeon: Lynann Bologna, MD;  Location: [facility];  Service: Endoscopy;;   CHOLECYSTECTOMY N/A 04/24/2021   Procedure: LAPAROSCOPIC CHOLECYSTECTOMY;  Surgeon: Andria Meuse, MD;  Location: [facility];  Service: General;  Laterality: N/A;  90   COLONOSCOPY  2004   per Dr. Juanda Chance, clear but incomplete; Barium study added, repeat in 10 yrs    DILATION AND CURETTAGE OF UTERUS     ERCP N/A 04/23/2021   Procedure: ENDOSCOPIC RETROGRADE CHOLANGIOPANCREATOGRAPHY (ERCP);  Surgeon: Lynann Bologna, MD;  Location: [facility];  Service: Endoscopy;  Laterality: N/A;   ESOPHAGOGASTRODUODENOSCOPY (EGD)  WITH PROPOFOL N/A 07/21/2018   Procedure: ESOPHAGOGASTRODUODENOSCOPY (EGD) WITH PROPOFOL;  Surgeon: Lynann Bologna, MD;  Location: [facility];  Service: Endoscopy;  Laterality: N/A;   ESOPHAGOGASTRODUODENOSCOPY (EGD) WITH PROPOFOL N/A 04/23/2021   Procedure: ESOPHAGOGASTRODUODENOSCOPY (EGD) WITH PROPOFOL;  Surgeon: Lynann Bologna, MD;  Location: [facility];  Service: Endoscopy;  Laterality: N/A;   exporatory surgery for rectal mass  1986   peri rectal cyst   FOREIGN BODY REMOVAL  07/21/2018   Procedure: FOREIGN BODY REMOVAL;  Surgeon: Lynann Bologna, MD;  Location: [facility];  Service: Endoscopy;;   REMOVAL OF STONES  04/23/2021   Procedure: REMOVAL OF STONES;  Surgeon: Lynann Bologna, MD;  Location: [facility];  Service: Endoscopy;;   SPHINCTEROTOMY  04/23/2021   Procedure: Dennison Mascot;  Surgeon: Lynann Bologna, MD;  Location: [facility];  Service: Endoscopy;;   THYROIDECTOMY  1988   S/P radiation previously seen by Levonne Hubert, MD @ [facility], now followed by Dr. Uvaldo Rising   TONSILLECTOMY  1972   Patient Active Problem List   Diagnosis Date Noted   Incomplete bladder emptying 12/23/2022   GERD (gastroesophageal reflux disease) 08/02/2022   Pelvic floor dysfunction in female 08/02/2022   Follicular thyroid carcinoma (HCC) 07/22/2022   Depression with anxiety 06/18/2021   Choledocholithiasis 04/20/2021   Gallstone pancreatitis 04/20/2021  DERMATITIS, SEBORRHEIC 07/14/2010   Squamous cell skin cancer 07/14/2010   Polymyalgia rheumatica (HCC) 06/23/2010   POSTMENOPAUSAL SYNDROME 06/03/2010   ARTHRALGIA 06/03/2010   Hypothyroidism, postsurgical 06/16/2008   DIVERTICULOSIS, COLON 06/16/2008   HYPERLIPIDEMIA 06/29/2007   Essential hypertension 01/05/2007   IBS 01/05/2007   FIBROCYSTIC BREAST DISEASE 01/05/2007    PCP: Nelwyn Salisbury, MD  REFERRING PROVIDER: Marguerita Beards, MD  REFERRING DIAG: R33.9 (ICD-10-CM) - Incomplete bladder emptying  THERAPY DIAG:   Other muscle spasm  Muscle weakness (generalized)  Unspecified lack of coordination  Rationale for Evaluation and Treatment: Rehabilitation  ONSET DATE: 2023  SUBJECTIVE:                                                                                                                                                                                           SUBJECTIVE STATEMENT: Reports she got up 8-9 times last night and night before last emptying small to moderate amounts of urine each time and then able to fall asleep at 5am this morning. Until Saturday after last session with PT was up only 2-3x.   Fluid intake: Yes: water - limits due to retention ~20 oz after paying closer attention to it    PAIN:  Are you having pain? No   PRECAUTIONS: None  WEIGHT BEARING RESTRICTIONS: No  FALLS:  Has patient fallen in last 6 months? No  LIVING ENVIRONMENT: Lives with: lives alone Lives in: House/apartment   OCCUPATION: retired   PLOF: Independent  PATIENT GOALS: to have improved urine habits  PERTINENT HISTORY:  Emptying was dysfunctional with a elevated PVR ( ), a sustained detrusor contraction present,  abdominal straining present, dyssynergic urethral sphincter activity on EMG.   Allergic angioedema, Chest pain (2000 and 2001), Complication of anesthesia, Diverticulosis, Fibrocystic breast disease, Hyperlipidemia, Hypertension, Irritable bowel syndrome, Perirectal cyst, PMR (polymyalgia rheumatica) (2011), PONV (postoperative nausea and vomiting), Seborrheic dermatitis, Squamous cell skin cancer, and Thyroid disease  Sexual abuse: No  BOWEL MOVEMENT: Pain with bowel movement: No Type of bowel movement:Type (Bristol Stool Scale) 4, Frequency daily to every other day, and Strain No Fully empty rectum: Yes:   Leakage: No Pads: No Fiber supplement: No  URINATION: Pain with urination: No Fully empty bladder: No Stream: Weak Urgency: No - only with laying down then  goes to the bathroom then can empty a little better Frequency: 8 total - 6 -10at night Leakage:  none Pads: No  INTERCOURSE: Pain with intercourse:  not active  - wasn't painful however after an infection had a series over 9 years with urethral stretching and had a surgery where the doctor "cleaned out  the head of the urethra" then was having no pain. Was diagnosed with chronic cystitis and was treated for that and didn't have pain.  Ability to have vaginal penetration:  Yes:   Climax: not painful Marinoff Scale: 0/3  PREGNANCY: Vaginal deliveries 2 Tearing Yes: with the first one had severe tearing with forceps and large episiotomy  C-section deliveries 0 Currently pregnant No  PROLAPSE: Cystocele per chart review   OBJECTIVE:   DIAGNOSTIC FINDINGS:  Urodynamic Impression:  1. Sensation was normal; capacity was normal 2. Stress Incontinence was not demonstrated at normal pressures; 3. Detrusor Overactivity was not demonstrated. 4. Emptying was dysfunctional with a elevated PVR ( ), a sustained detrusor contraction present,  abdominal straining present, dyssynergic urethral sphincter activity on EMG.  COGNITION: Overall cognitive status: Within functional limits for tasks assessed     SENSATION: Light touch: Appears intact Proprioception: Appears intact  MUSCLE LENGTH: Hamstrings and adductors limited by 25%   POSTURE: rounded shoulders, forward head, and posterior pelvic tilt   LUMBARAROM/PROM:  A/PROM A/PROM  eval  Flexion Limited by 25%  Extension WFL  Right lateral flexion Limited by 25%  Left lateral flexion Limited by 25%  Right rotation Limited by 25%  Left rotation Limited by 25%   (Blank rows = not tested)  LOWER EXTREMITY ROM:  WFL  LOWER EXTREMITY MMT:  Hips bil grossly 4/5, knees 5/5  PALPATION:   General  no TTP throughout abdomen but tension noted throughout lower abdomen                External Perineal Exam no TTP, widening of  vaginal opening                             Internal Pelvic Floor possible grade 2-3 anterior wall laxity noted in hooklying at rest possibly 2 and with cough possible 3.   Patient confirms identification and approves PT to assess internal pelvic floor and treatment Yes  PELVIC MMT:   MMT eval 04/24/23   Vaginal 1/5 improved to 2/5 with quick taps, 7s, 4 reps - however required compensation to hold contraction from glutes/abdominals/thighs. And relaxation noted internally however difficult to identify if this was from compensatory muscle movement or true pelvic floor  2/5 with cues for technique and exhale to be coordinated. In sitting with towel roll pt states she can feel this contraction much more and understands this better.   Internal Anal Sphincter    External Anal Sphincter    Puborectalis    Diastasis Recti    (Blank rows = not tested)        TONE: Decreased in hooklying   PROLAPSE: Possible grade 2-3 anterior laxity in hooklying worsening with cough In hooklying with cough grade 2 anterior wall laxity 04/24/23   TODAY'S TREATMENT:  DATE:   04/24/23  Manual:  External manual work at low abdomen at suprapubic region. Greatly improved mobility overall. Does still have scar tissue limited mobility at scar but surrounding tissue much more mobile. Pt reports she has been doing abdominal massage at home and does state she feels the pull from her scar but thinks the other areas are better.  Internal manual work completed vaginally with pt consent, with gentle mobility at anterior pelvic floor while outer hand completing gentle abdominal external manual work. Pt reports she does not feel any tension any longer aft end of this session. Did retest strength, slight improvement noted but still weakness present. Able to feel contractions better in sitting compared to  hooklying but in sitting with towel roll.  Pt educated on urge drill to attempt at night only and educated if urgency or pain worsens to stop this. Pt agreed - handout given. Seated pelvic floor contractions 2x10 daily and to stop if any pain starts. Pt denied questions given all handouts and reviewed during session.    PATIENT EDUCATION:  Education details: [id number] Person educated: Patient Education method: Explanation, Demonstration, Tactile cues, Verbal cues, and Handouts Education comprehension: verbalized understanding and returned demonstration  HOME EXERCISE PROGRAM: [id number]  ASSESSMENT:  CLINICAL IMPRESSION: Patient presents for treatment, session focused on manual at abdomen for decreased tension over bladder and stretching at bil hips for improved pelvic mobility and internal manual vaginally. Extra time for internal pelvic floor strength testing and pt able to contract pelvic floor slightly better than at eval but reports in sitting able to feel contraction much better. Pt tolerated well and denied pain, continues to state she feels better at end of session and less tension felt per pt. Does report improvement for about 5-6 days after treatments with PT then tension and urgency returns. Pt reports she does plan to update medical provider as well. Has one session at end of October to follow up with PFPT and on hold until this or if there are changes from medical provider.   OBJECTIVE IMPAIRMENTS: decreased coordination, decreased endurance, decreased mobility, decreased strength, increased fascial restrictions, impaired flexibility, improper body mechanics, postural dysfunction, and pain.   ACTIVITY LIMITATIONS: carrying, lifting, squatting, and continence  PARTICIPATION LIMITATIONS: community activity  PERSONAL FACTORS: Time since onset of injury/illness/exacerbation and 1 comorbidity: x2 vaginal births one with severe tearing  are also affecting patient's functional outcome.    REHAB POTENTIAL: Good  CLINICAL DECISION MAKING: Stable/uncomplicated  EVALUATION COMPLEXITY: Low   GOALS: Goals reviewed with patient? Yes  SHORT TERM GOALS: Target date: 02/14/23  Pt to be I with HEP.  Baseline: Goal status: MET  2.  Pt to demonstrate at least 3/5 pelvic floor strength for improved pelvic stability and decreased strain at pelvic floor/ decrease leakage.  Baseline:  Goal status: on going  3.  Pt to be I with pressure management techniques for decreased strain at pelvic floor and prolapse Baseline:  Goal status: MET  4.  Pt to be I with breathing mechanics and voiding mechanics to decrease strain at pelvic floor and prolapse. Baseline:  Goal status: MET   LONG TERM GOALS: Target date: 04/26/23  Pt to be I with advanced HEP.  Baseline:  Goal status: MET  2.  Pt to demonstrate at least 3/5 pelvic floor strength and ability to fully contract for 8 s and relax post contraction to for improved pelvic stability and decreased strain at pelvic floor/ decrease leakage.  Baseline:  Goal status: on  going  3.  Pt to demonstrate improved coordination of pelvic floor and breathing mechanics with body weight squat with appropriate synergistic patterns to decrease pain and leakage at least 75% of the time.    Baseline:  Goal status: on going  4.  Pt to be I with relaxation techniques to decreased straining with attempts to urinate. Baseline:  Goal status: MET  5.  Pt to report ability to void fully at least 75% of time for decreased feeling of retention.  Baseline:  Goal status: MET  PLAN:  PT FREQUENCY: 1x/week  PT DURATION:  8 sessions  PLANNED INTERVENTIONS: Therapeutic exercises, Therapeutic activity, Neuromuscular re-education, Patient/Family education, Self Care, Joint mobilization, Aquatic Therapy, Dry Needling, Electrical stimulation, Spinal mobilization, Cryotherapy, Moist heat, scar mobilization, Taping, Biofeedback, and Manual therapy  PLAN FOR  NEXT SESSION: internal as needed and pt consents, core and hip strengthening and pelvic floor, relaxation techniques, voiding mechanics, breathing mechanics, pelvic mobility, pressure management. Follow up October   Otelia Sergeant, PT, DPT 04/23/2410:51 AM

## 2023-05-09 DIAGNOSIS — N390 Urinary tract infection, site not specified: Secondary | ICD-10-CM | POA: Diagnosis not present

## 2023-05-09 DIAGNOSIS — R300 Dysuria: Secondary | ICD-10-CM | POA: Diagnosis not present

## 2023-06-23 ENCOUNTER — Encounter: Payer: Self-pay | Admitting: Obstetrics and Gynecology

## 2023-06-23 ENCOUNTER — Ambulatory Visit (INDEPENDENT_AMBULATORY_CARE_PROVIDER_SITE_OTHER): Payer: Medicare Other | Admitting: Obstetrics and Gynecology

## 2023-06-23 VITALS — BP 119/65 | HR 66

## 2023-06-23 DIAGNOSIS — N8110 Cystocele, unspecified: Secondary | ICD-10-CM

## 2023-06-23 DIAGNOSIS — N816 Rectocele: Secondary | ICD-10-CM | POA: Diagnosis not present

## 2023-06-23 DIAGNOSIS — N952 Postmenopausal atrophic vaginitis: Secondary | ICD-10-CM

## 2023-06-23 MED ORDER — ESTRADIOL 0.1 MG/GM VA CREA
0.5000 g | TOPICAL_CREAM | VAGINAL | 11 refills | Status: DC
Start: 2023-06-26 — End: 2024-01-03

## 2023-06-23 NOTE — Progress Notes (Signed)
Urogynecology Return Visit  SUBJECTIVE  History of Present Illness: [name] [name] is a 88 y.o. female seen in follow-up for incomplete bladder emptying.   Patient had completed pelvic physical therapy over the summer. She feels that it did help her to "relax" further. During the day, she feels she is sometimes able to empty her bladder better but other times has difficulty.  She takes azo at 4pm and 10pm and feels this helps to relax her to go to bed. She wakes 5-8 times at night to urinate.  When she had the pessary in place, it was not easier for her to urinate. It hurt badly. She is not interested in another pessary.   She went to urgent care in September for burning with urination. She was given macrodantin which helped her symptoms. .   Urodynamic Impression:  1. Sensation was normal; capacity was normal 2. Stress Incontinence was not demonstrated at normal pressures; 3. Detrusor Overactivity was not demonstrated. 4. Emptying was dysfunctional with a elevated PVR ( ), a sustained detrusor contraction present,  abdominal straining present, dyssynergic urethral sphincter activity on EMG.   Previously normal renal function and renal US.   She reports she is taking metamucil for constipation. She is having a BM every 4 days.   Past Medical History: Patient  has a past medical history of Allergic angioedema, Chest pain (2000 and 2001), Complication of anesthesia, Diverticulosis, Fibrocystic breast disease, Hyperlipidemia, Hypertension, Irritable bowel syndrome, Perirectal cyst, PMR (polymyalgia rheumatica) (HCC) (2011), PONV (postoperative nausea and vomiting), Seborrheic dermatitis, Squamous cell skin cancer, and Thyroid disease.   Past Surgical History: She  has a past surgical history that includes Thyroidectomy (1988); Dilation and curettage of uterus; Tonsillectomy (1972); exporatory surgery for rectal mass (1986); Colonoscopy (2004); Esophagogastroduodenoscopy  (egd) with propofol (N/A, 07/21/2018); Foreign Body Removal (07/21/2018); Cholecystectomy (N/A, 04/24/2021); ERCP (N/A, 04/23/2021); sphincterotomy (04/23/2021); Biliary dilation (04/23/2021); removal of stones (04/23/2021); and Esophagogastroduodenoscopy (egd) with propofol (N/A, 04/23/2021).   Medications: She has a current medication list which includes the following prescription(s): calcium carbonate, cholecalciferol, citalopram, diphenhydramine, epinephrine, [START ON 06/26/2023] estradiol, levothyroxine, meloxicam, metoprolol tartrate, phenazopyridine, pravastatin, spironolactone, and trimethoprim.   Allergies: Patient is allergic to horse-derived products, other, penicillins, shellfish allergy, shellfish-derived products, bacitracin-polymyxin b, egg-derived products, and neomycin-bacitracin zn-polymyx.   Social History: Patient  reports that she has never smoked. She has never used smokeless tobacco. She reports current alcohol use. She reports that she does not use drugs.      OBJECTIVE     Physical Exam: Vitals:   06/23/23 1326  BP: 119/65  Pulse: 66    Gen: No apparent distress, A&O x 3.  Detailed Urogynecologic Evaluation:  Normal external genitalia with atrophy. On speculum, normal vaginal mucosa and normal appearing cervix. On bimanual, uterus is small, mobile and nontender.   Rectal: moderate rectocele confirmed.   Prior exam showed: POP-Q  -0.5                                            Aa   -0.5                                           Ba  -8  C   2.5                                            Gh  4                                            Pb  9                                            tvl   -1.5                                            Ap  -1.5                                            Bp  -8.5                                              D     ASSESSMENT AND PLAN    [name] is a 88 y.o. with:   1. Vaginal atrophy   2. Prolapse of anterior vaginal wall   3. Prolapse of posterior vaginal wall    - For vaginal atrophy, start estrace cream nightly for two weeks then twice a week after.  - Start miralax daily for constipation.   - Plan for surgery: Exam under anesthesia, anterior and posterior repair, cystoscopy  - We reviewed the patient's specific anatomic and functional findings, with the assistance of diagrams, and together finalized the above procedure. The planned surgical procedures were discussed along with the surgical risks outlined below, which were also provided on a detailed handout. Additional treatment options including expectant management, conservative management, medical management were discussed where appropriate.  We reviewed the benefits and risks of each treatment option.   General Surgical Risks: For all procedures, there are risks of bleeding, infection, damage to surrounding organs including but not limited to bowel, bladder, blood vessels, ureters and nerves, and need for further surgery if an injury were to occur. These risks are all low with minimally invasive surgery.   There are risks of numbness and weakness at any body site or buttock/rectal pain.  It is possible that baseline pain can be worsened by surgery, either with or without mesh. If surgery is vaginal, there is also a low risk of possible conversion to laparoscopy or open abdominal incision where indicated. Very rare risks include blood transfusion, blood clot, heart attack, pneumonia, or death.   There is also a risk of short-term postoperative urinary retention with need to use a catheter. About half of patients need to go home from surgery with a catheter, which is then later removed in the office. The risk of long-term need for a catheter is very low. There is also a risk of  worsening of overactive bladder.   Prolapse (with or without mesh): Risk factors for surgical failure  include things that  put pressure on your pelvis and the surgical repair, including obesity, chronic cough, and heavy lifting or straining (including lifting children or adults, straining on the toilet, or lifting heavy objects such as furniture or anything weighing >25 lbs. Risks of recurrence is 20-30% with vaginal native tissue repair and a less than 10% with sacrocolpopexy with mesh.     - For preop Visit:  She is required to have a visit within 30 days of her surgery.   - Medical clearance: not required  - Anticoagulant use: No - Medicaid Hysterectomy form: No - Accepts blood transfusion: Yes - Expected length of stay: outpatient  Request sent for surgery scheduling.   Marguerita Beards, MD

## 2023-06-23 NOTE — Patient Instructions (Addendum)
Recommend starting Miralax daily to help with bowel movements. Can take this in addition to metamucil.  Start vaginal estrogen therapy nightly for two weeks then 2 times weekly at night for treatment of vaginal atrophy (dryness of the vaginal tissues).  Please let us know if the prescription is too expensive and we can look for alternative options.  Can also use coconut oil on the vagina and vulva as needed.    Plan for surgery: Anterior repair (repair of bladder wall), posterior repair (repair of the rectal wall), cystoscopy (look inside the bladder)  General Surgical Risks: For all procedures, there are risks of bleeding, infection, damage to surrounding organs including but not limited to bowel, bladder, blood vessels, ureters and nerves, and need for further surgery if an injury were to occur. These risks are all low with minimally invasive surgery.   There are risks of numbness and weakness at any body site or buttock/rectal pain.  It is possible that baseline pain can be worsened by surgery, either with or without mesh. If surgery is vaginal, there is also a low risk of possible conversion to laparoscopy or open abdominal incision where indicated. Very rare risks include blood transfusion, blood clot, heart attack, pneumonia, or death.   There is also a risk of short-term postoperative urinary retention with need to use a catheter. About half of patients need to go home from surgery with a catheter, which is then later removed in the office. The risk of long-term need for a catheter is very low. There is also a risk of worsening of overactive bladder.   Prolapse (with or without mesh): Risk factors for surgical failure  include things that put pressure on your pelvis and the surgical repair, including obesity, chronic cough, and heavy lifting or straining (including lifting children or adults, straining on the toilet, or lifting heavy objects such as furniture or anything weighing >25 lbs. Risks  of recurrence is 20-30% with vaginal native tissue repair and a less than 10% with sacrocolpopexy with mesh.

## 2023-06-28 ENCOUNTER — Encounter: Payer: Self-pay | Admitting: Internal Medicine

## 2023-06-28 ENCOUNTER — Ambulatory Visit (INDEPENDENT_AMBULATORY_CARE_PROVIDER_SITE_OTHER): Payer: Medicare Other | Admitting: Internal Medicine

## 2023-06-28 VITALS — BP 110/70 | HR 64 | Ht 63.0 in | Wt 145.6 lb

## 2023-06-28 DIAGNOSIS — C73 Malignant neoplasm of thyroid gland: Secondary | ICD-10-CM

## 2023-06-28 DIAGNOSIS — E890 Postprocedural hypothyroidism: Secondary | ICD-10-CM | POA: Diagnosis not present

## 2023-06-28 LAB — T4, FREE: Free T4: 1.66 ng/dL — ABNORMAL HIGH (ref 0.60–1.60)

## 2023-06-28 LAB — TSH: TSH: <0.01 u[IU]/mL — ABNORMAL LOW (ref 0.35–5.50)

## 2023-06-28 NOTE — Progress Notes (Signed)
Name: [name] [name]  MRN/ DOB: [id number], [birth year]    Age/ Sex: 88 y.o., female    PCP: Nelwyn Salisbury, MD   Reason for Endocrinology Evaluation: Hypothyroidism     Date of Initial Endocrinology Evaluation: 07/22/2022    HPI: [name] [name] is a 88 y.o. female with a past medical history of Htn, Dyslipidemia and RA. The patient presented for initial endocrinology clinic visit on 07/22/2022 for consultative assistance with her Hypothyroidism.   She is S/P total thyroidectomy 08/1987 due to Follicular thyroid carcinoma. She is S/P RAI ablation 1989    Her last thyroid ultrasound was in 2014 with NO evidence of recurrence   TG and TG antibodies were  negative -07/2022  SUBJECTIVE:    Today (06/28/23):  [name] [name] is here for follow-up on post operative hypothyroid   Weight overall stable No local neck swelling  Has noted hair loss with levothyroxine 112 mcg daily  Insomnia has improved  Continues with changes in BM , that she attributes to IBS and bladder issues  Denies tremors and     She has been undergoing physical therapy for incomplete emptying of the bladder, pending sx in 09/2023   Levothyroxine 112 mcg daily   HISTORY:  Past Medical History:  Past Medical History:  Diagnosis Date   Allergic angioedema    from shellfish and tree nuts   Chest pain 2000 and 2001   negative cardiac workup   Complication of anesthesia    Diverticulosis    Fibrocystic breast disease    Hyperlipidemia    Hypertension    Irritable bowel syndrome    Perirectal cyst    ?lymphocoele   PMR (polymyalgia rheumatica) (HCC) 2011   PONV (postoperative nausea and vomiting)    Seborrheic dermatitis    Squamous cell skin cancer    X1, [facility]   Thyroid disease    post op hypothyroidism; TSH goal would be sup;pressive   Past Surgical History:  Past Surgical History:  Procedure Laterality Date   BILIARY DILATION  04/23/2021   Procedure: BILIARY DILATION;   Surgeon: Lynann Bologna, MD;  Location: [facility];  Service: Endoscopy;;   CHOLECYSTECTOMY N/A 04/24/2021   Procedure: LAPAROSCOPIC CHOLECYSTECTOMY;  Surgeon: Andria Meuse, MD;  Location: [facility];  Service: General;  Laterality: N/A;  90   COLONOSCOPY  2004   per Dr. Juanda Chance, clear but incomplete; Barium study added, repeat in 10 yrs    DILATION AND CURETTAGE OF UTERUS     ERCP N/A 04/23/2021   Procedure: ENDOSCOPIC RETROGRADE CHOLANGIOPANCREATOGRAPHY (ERCP);  Surgeon: Lynann Bologna, MD;  Location: [facility];  Service: Endoscopy;  Laterality: N/A;   ESOPHAGOGASTRODUODENOSCOPY (EGD) WITH PROPOFOL N/A 07/21/2018   Procedure: ESOPHAGOGASTRODUODENOSCOPY (EGD) WITH PROPOFOL;  Surgeon: Lynann Bologna, MD;  [facility];  Service: Endoscopy;  Laterality: N/A;   ESOPHAGOGASTRODUODENOSCOPY (EGD) WITH PROPOFOL N/A 04/23/2021   Procedure: ESOPHAGOGASTRODUODENOSCOPY (EGD) WITH PROPOFOL;  Surgeon: Lynann Bologna, MD;  [facility];  Service: Endoscopy;  Laterality: N/A;   exporatory surgery for rectal mass  1986   peri rectal cyst   FOREIGN BODY REMOVAL  07/21/2018   Procedure: FOREIGN BODY REMOVAL;  Surgeon: Lynann Bologna, MD;  [facility];  Service: Endoscopy;;   REMOVAL OF STONES  04/23/2021   Procedure: REMOVAL OF STONES;  Surgeon: Lynann Bologna, MD;  [facility];  Service: Endoscopy;;   SPHINCTEROTOMY  04/23/2021   Procedure: Dennison Mascot;  Surgeon: Lynann Bologna, MD;  [facility];  Service: Endoscopy;;   THYROIDECTOMY  1988   S/P radiation previously seen by Levonne Hubert, MD @ [facility], now followed by Dr. Uvaldo Rising   TONSILLECTOMY  [phone]    Social History:  reports that she has never smoked. She has never used smokeless tobacco. She reports current alcohol use. She reports that she does not use drugs. Family History: family history includes Breast cancer in her maternal grandmother; Heart attack (age of onset: 86) in her paternal uncle;  Heart attack (age of onset: 64) in her paternal grandfather; Hypertension in her maternal grandmother and mother; Parkinsonism in her brother; Prostate cancer in her father.   HOME MEDICATIONS: Allergies as of 06/28/2023       Reactions   Horse-derived Products    Redness & swelling of arm   Other Anaphylaxis   Tree nuts (not peanuts)  Other reaction(s): Other (See Comments) Uncoded Allergy. Allergen: Other Reaction: red and swelling   Penicillins    Rash 04/20/2021 tolerated Zosyn   Shellfish Allergy Other (See Comments)   Other Reaction: throat closes up   Shellfish-derived Products    Throat swelling   Bacitracin-polymyxin B    Local swelling   Egg-derived Products    REACTION: Unable to get Flu Vaccine Skin Tests+ for egg products   Neomycin-bacitracin Zn-polymyx         Medication List        Accurate as of June 28, 2023  1:45 PM. If you have any questions, ask your nurse or doctor.          CALCIUM 500 PO Take 1 tablet by mouth daily at 6 (six) AM.   citalopram 20 MG tablet Commonly known as: CELEXA Take 0.5 tablets (10 mg total) by mouth daily.   diphenhydrAMINE 25 mg capsule Commonly known as: BENADRYL Take 25 mg by mouth as needed.   EPINEPHrine 0.3 mg/0.3 mL Soaj injection Commonly known as: EPI-PEN Inject 0.3 mg into the muscle as needed for anaphylaxis.   estradiol 0.1 MG/GM vaginal cream Commonly known as: ESTRACE Place 0.5 g vaginally 2 (two) times a week. Place 0.5g nightly for two weeks then twice a week after   levothyroxine 112 MCG tablet Commonly known as: SYNTHROID Take 1 tablet (112 mcg total) by mouth daily.   meloxicam 15 MG tablet Commonly known as: MOBIC Take 1 tablet (15 mg total) by mouth daily.   metoprolol tartrate 100 MG tablet Commonly known as: LOPRESSOR TAKE 1 TABLET BY MOUTH EVERY MORNING AND 1/2 TABLET BY MOUTH EVERY EVENING   nitrofurantoin (macrocrystal-monohydrate) 100 MG capsule Commonly known as:  MACROBID Take 100 mg by mouth 2 (two) times daily.   phenazopyridine 95 MG tablet Commonly known as: PYRIDIUM Take 1 tablet (95 mg total) by mouth 3 (three) times daily as needed for pain.   pravastatin 40 MG tablet Commonly known as: PRAVACHOL TAKE 1 TABLET(40 MG) BY MOUTH DAILY   spironolactone 25 MG tablet Commonly known as: ALDACTONE Take 1 tablet (25 mg total) by mouth daily.   trimethoprim 100 MG tablet Commonly known as: TRIMPEX Take 1 tablet (100 mg total) by mouth daily.   VITAMIN D3 GUMMIES PO Take 1 tablet by mouth daily at 6 (six) AM.          REVIEW OF SYSTEMS: A comprehensive ROS was conducted with the patient and is negative except as per HPI   OBJECTIVE:  VS: Ht 5\' 3"  (1.6 m)   Wt 145 lb 9.6 oz (66 kg)  BMI 25.79 kg/m    Wt Readings from Last 3 Encounters:  06/28/23 145 lb 9.6 oz (66 kg)  12/23/22 148 lb (67.1 kg)  11/11/22 150 lb (68 kg)     EXAM: General: Pt appears well and is in NAD  Neck: General: Supple without adenopathy. Thyroid: No goiter or nodules appreciated.  Lungs: Clear with good BS bilat   Heart: Auscultation: RRR.  Extremities:  BL LE: No pretibial edema normal ROM and strength.  Mental Status: Judgment, insight: Intact Orientation: Oriented to time, place, and person Mood and affect: No depression, anxiety, or agitation     DATA REVIEWED:   Latest Reference Range & Units 06/28/23 14:20  TSH 0.35 - 5.50 uIU/mL <0.01 (L)  T4,Free(Direct) 0.60 - 1.60 ng/dL 2.53 (H)  (L): Data is abnormally low (H): Data is abnormally high    Latest Reference Range & Units 07/22/22 13:50  Thyroglobulin ng/mL 0.1 (L)  Thyroglobulin Ab < or = 1 IU/mL <1     ASSESSMENT/PLAN/RECOMMENDATIONS:   Postoperative Hypothyroidism:  -Patient is complaining of hair loss, and is wondering if we can increase the dose if her TFTs are lowered, unfortunately her TSH is suppressed with elevated free T4 -She has been noted with drastic changes in  her TSH with variable levothyroxine dose, she assures me compliance -I will decrease levothyroxine as below and recheck TFTs in 2 months  Medications :  Decrease levothyroxine 112 mcg , 1 tablet Monday through Saturday and none on Sundays    2. Hx of Follicular thyroid carcinoma  - She is > 10 yrs  out from total thyroidectomy  - S/P RAI ablation in 1989 - Last ultrasound in 2014 showed no evidence of recurrence  - Tg , Tg Ab 's undetectable     F/U in 6 months    Signed electronically by: Lyndle Herrlich, MD  [facility] Endocr[facility]  [facility] [address] [place] [place], [place] [address] Phone: [phone] FAX: [phone]   CC: Nelwyn Salisbury, MD [address] [place] [address] Phone: [phone] Fax: [phone]   Return to Endocrinology clinic as below: Future Appointments  Date Time Provider Department Center  07/06/2023 11:00 AM Barbaraann Faster, PT [facility] None

## 2023-06-28 NOTE — Patient Instructions (Signed)
  If you start BIOTIN or hair vitamins please hold them 2-3 days before any future thyroid blood work    You are on levothyroxine - which is your thyroid hormone supplement. You MUST take this consistently.  You should take this first thing in the morning on an empty stomach with water. You should not take it with other medications. Wait to 1hr prior to eating. If you are taking any vitamins - please take these in the evening.   If you miss a dose, please take your missed dose the following day (double the dose for that day). You should have a pill box for ONLY levothyroxine on your bedside table to help you remember to take your medications.

## 2023-06-29 ENCOUNTER — Telehealth: Payer: Self-pay | Admitting: Internal Medicine

## 2023-06-29 LAB — THYROGLOBULIN ANTIBODY: Thyroglobulin Ab: <1 [IU]/mL (ref <=1)

## 2023-06-29 LAB — THYROGLOBULIN LEVEL: Thyroglobulin: 0.1 ng/mL — ABNORMAL LOW

## 2023-06-29 MED ORDER — LEVOTHYROXINE SODIUM 112 MCG PO TABS: 112.0000 ug | ORAL_TABLET | ORAL | 2 refills | Status: DC

## 2023-06-29 NOTE — Telephone Encounter (Signed)
Please let the patient know that her thyroid test shows she is on way too much levothyroxine, this is the reason for the hair loss   My suggestion is to take levothyroxine 112 mcg daily Monday through Saturday and none on Sundays from now on...    She is already scheduled for a lab appointment in 2 months   Please advise the patient to use a pillbox if she has not done so to assure levothyroxine intake as prescribed   Thanks

## 2023-06-30 NOTE — Telephone Encounter (Signed)
LMTCB

## 2023-07-03 NOTE — Telephone Encounter (Signed)
Patient advised and verbalized understanding. Will make medication adjustment and lab result will be mailed.

## 2023-07-03 NOTE — Telephone Encounter (Signed)
LMTCB

## 2023-07-04 ENCOUNTER — Ambulatory Visit: Payer: Medicare Other | Attending: Obstetrics and Gynecology | Admitting: Physical Therapy

## 2023-07-04 DIAGNOSIS — M62838 Other muscle spasm: Secondary | ICD-10-CM | POA: Diagnosis not present

## 2023-07-04 DIAGNOSIS — M6281 Muscle weakness (generalized): Secondary | ICD-10-CM | POA: Diagnosis not present

## 2023-07-04 NOTE — Therapy (Addendum)
OUTPATIENT PHYSICAL THERAPY FEMALE PELVIC TREATMENT   Patient Name: [name] [name] MRN: [id number] DOB:1934-11-14, 88 y.o., female Today's Date: 07/04/2023    END OF SESSION:  PT End of Session - 07/04/23 1534     Visit Number 10    Date for PT Re-Evaluation 07/04/23    Authorization Type MCR    Authorization Time Period --    Progress Note Due on Visit 15   progress note done at visit 5 with [facility] recert   Activity Tolerance Patient tolerated treatment well    Behavior During Therapy [facility] for tasks assessed/performed                Past Medical History:  Diagnosis Date   Allergic angioedema    from shellfish and tree nuts   Chest pain 2000 and 2001   negative cardiac workup   Complication of anesthesia    Diverticulosis    Fibrocystic breast disease    Hyperlipidemia    Hypertension    Irritable bowel syndrome    Perirectal cyst    ?lymphocoele   PMR (polymyalgia rheumatica) (HCC) 2011   PONV (postoperative nausea and vomiting)    Seborrheic dermatitis    Squamous cell skin cancer    X1, [facility]   Thyroid disease    post op hypothyroidism; TSH goal would be sup;pressive   Past Surgical History:  Procedure Laterality Date   BILIARY DILATION  04/23/2021   Procedure: BILIARY DILATION;  Surgeon: Lynann Bologna, MD;  [facility];  Service: Endoscopy;;   CHOLECYSTECTOMY N/A 04/24/2021   Procedure: LAPAROSCOPIC CHOLECYSTECTOMY;  Surgeon: Andria Meuse, MD;  Location: [facility];  Service: General;  Laterality: N/A;  90   COLONOSCOPY  2004   per Dr. Juanda Chance, clear but incomplete; Barium study added, repeat in 10 yrs    DILATION AND CURETTAGE OF UTERUS     ERCP N/A 04/23/2021   Procedure: ENDOSCOPIC RETROGRADE CHOLANGIOPANCREATOGRAPHY (ERCP);  Surgeon: Lynann Bologna, MD;  Location: [facility];  Service: Endoscopy;  Laterality: N/A;   ESOPHAGOGASTRODUODENOSCOPY (EGD) WITH PROPOFOL N/A 07/21/2018   Procedure: ESOPHAGOGASTRODUODENOSCOPY (EGD) WITH  PROPOFOL;  Surgeon: Lynann Bologna, MD;  [facility];  Service: Endoscopy;  Laterality: N/A;   ESOPHAGOGASTRODUODENOSCOPY (EGD) WITH PROPOFOL N/A 04/23/2021   Procedure: ESOPHAGOGASTRODUODENOSCOPY (EGD) WITH PROPOFOL;  Surgeon: Lynann Bologna, MD;  [facility];  Service: Endoscopy;  Laterality: N/A;   exporatory surgery for rectal mass  1986   peri rectal cyst   FOREIGN BODY REMOVAL  07/21/2018   Procedure: FOREIGN BODY REMOVAL;  Surgeon: Lynann Bologna, MD;  [facility];  Service: Endoscopy;;   REMOVAL OF STONES  04/23/2021   Procedure: REMOVAL OF STONES;  Surgeon: Lynann Bologna, MD;  [facility];  Service: Endoscopy;;   SPHINCTEROTOMY  04/23/2021   Procedure: Dennison Mascot;  Surgeon: Lynann Bologna, MD;  [facility];  Service: Endoscopy;;   THYROIDECTOMY  1988   S/P radiation previously seen by Levonne Hubert, MD @ [facility], now followed by Dr. Uvaldo Rising   TONSILLECTOMY  1972   Patient Active Problem List   Diagnosis Date Noted   Incomplete bladder emptying 12/23/2022   GERD (gastroesophageal reflux disease) 08/02/2022   Pelvic floor dysfunction in female 08/02/2022   Follicular thyroid carcinoma (HCC) 07/22/2022   Depression with anxiety 06/18/2021   Choledocholithiasis 04/20/2021   Gallstone pancreatitis 04/20/2021   DERMATITIS, SEBORRHEIC 07/14/2010   Squamous cell skin cancer 07/14/2010   Polymyalgia rheumatica (HCC) 06/23/2010   POSTMENOPAUSAL SYNDROME 06/03/2010  ARTHRALGIA 06/03/2010   Hypothyroidism, postsurgical 06/16/2008   DIVERTICULOSIS, COLON 06/16/2008   HYPERLIPIDEMIA 06/29/2007   Essential hypertension 01/05/2007   IBS 01/05/2007   FIBROCYSTIC BREAST DISEASE 01/05/2007    PCP: Nelwyn Salisbury, MD  REFERRING PROVIDER: Marguerita Beards, MD  REFERRING DIAG: R33.9 (ICD-10-CM) - Incomplete bladder emptying  THERAPY DIAG:  Other muscle spasm  Muscle weakness (generalized)  Rationale for Evaluation and  Treatment: Rehabilitation  ONSET DATE: 2023  SUBJECTIVE:                                                                                                                                                                                           SUBJECTIVE STATEMENT: Reports has been doing abdominal massage and focusing over bladder and reports she has had much less pain and more able to empty urine now. Did go to ER with pain in sept and reports she was given a medication and this helped a lot and has not returned. Reports she met with gyn and does plan to have prolapse repair surgery.  Now only getting up about 3 times at night now, waking from sleep.    Fluid intake: Yes: water - limits due to retention ~20 oz after paying closer attention to it    PAIN:  Are you having pain? No   PRECAUTIONS: None  WEIGHT BEARING RESTRICTIONS: No  FALLS:  Has patient fallen in last 6 months? No  LIVING ENVIRONMENT: Lives with: lives alone Lives in: House/apartment   OCCUPATION: retired   PLOF: Independent  PATIENT GOALS: to have improved urine habits  PERTINENT HISTORY:  Emptying was dysfunctional with a elevated PVR ( ), a sustained detrusor contraction present,  abdominal straining present, dyssynergic urethral sphincter activity on EMG.   Allergic angioedema, Chest pain (2000 and 2001), Complication of anesthesia, Diverticulosis, Fibrocystic breast disease, Hyperlipidemia, Hypertension, Irritable bowel syndrome, Perirectal cyst, PMR (polymyalgia rheumatica) (2011), PONV (postoperative nausea and vomiting), Seborrheic dermatitis, Squamous cell skin cancer, and Thyroid disease  Sexual abuse: No  BOWEL MOVEMENT: Pain with bowel movement: No Type of bowel movement:Type (Bristol Stool Scale) 4, Frequency daily to every other day, and Strain No Fully empty rectum: Yes:   Leakage: No Pads: No Fiber supplement: No  URINATION: Pain with urination: No Fully empty bladder: No Stream:  Weak Urgency: No - only with laying down then goes to the bathroom then can empty a little better Frequency: 8 total - 6 -10at night Leakage:  none Pads: No  INTERCOURSE: Pain with intercourse:  not active  - wasn't painful however after an infection had a series over 9 years with urethral stretching and had  a surgery where the doctor "cleaned out the head of the urethra" then was having no pain. Was diagnosed with chronic cystitis and was treated for that and didn't have pain.  Ability to have vaginal penetration:  Yes:   Climax: not painful Marinoff Scale: 0/3  PREGNANCY: Vaginal deliveries 2 Tearing Yes: with the first one had severe tearing with forceps and large episiotomy  C-section deliveries 0 Currently pregnant No  PROLAPSE: Cystocele per chart review   OBJECTIVE:   DIAGNOSTIC FINDINGS:  Urodynamic Impression:  1. Sensation was normal; capacity was normal 2. Stress Incontinence was not demonstrated at normal pressures; 3. Detrusor Overactivity was not demonstrated. 4. Emptying was dysfunctional with a elevated PVR ( ), a sustained detrusor contraction present,  abdominal straining present, dyssynergic urethral sphincter activity on EMG.  COGNITION: Overall cognitive status: Within functional limits for tasks assessed     SENSATION: Light touch: Appears intact Proprioception: Appears intact  MUSCLE LENGTH: Hamstrings and adductors limited by 25%   POSTURE: rounded shoulders, forward head, and posterior pelvic tilt   LUMBARAROM/PROM:  A/PROM A/PROM  eval  Flexion Limited by 25%  Extension WFL  Right lateral flexion Limited by 25%  Left lateral flexion Limited by 25%  Right rotation Limited by 25%  Left rotation Limited by 25%   (Blank rows = not tested)  LOWER EXTREMITY ROM:  WFL  LOWER EXTREMITY MMT:  Hips bil grossly 4/5, knees 5/5  PALPATION:   General  no TTP throughout abdomen but tension noted throughout lower abdomen                 External Perineal Exam no TTP, widening of vaginal opening                             Internal Pelvic Floor possible grade 2-3 anterior wall laxity noted in hooklying at rest possibly 2 and with cough possible 3.   Patient confirms identification and approves PT to assess internal pelvic floor and treatment Yes  PELVIC MMT:   MMT eval 04/24/23   Vaginal 1/5 improved to 2/5 with quick taps, 7s, 4 reps - however required compensation to hold contraction from glutes/abdominals/thighs. And relaxation noted internally however difficult to identify if this was from compensatory muscle movement or true pelvic floor  2/5 with cues for technique and exhale to be coordinated. In sitting with towel roll pt states she can feel this contraction much more and understands this better.   Internal Anal Sphincter    External Anal Sphincter    Puborectalis    Diastasis Recti    (Blank rows = not tested)        TONE: Decreased in hooklying   PROLAPSE: Possible grade 2-3 anterior laxity in hooklying worsening with cough In hooklying with cough grade 2 anterior wall laxity 04/24/23   TODAY'S TREATMENT:  DATE:   07/04/23; Pt had several questions about recommendations and ways to maintain progress PT educated pt on continued relaxation techniques, pelvic drops, abdominal massage and diaphragmatic breathing, and voiding mechanics and to continue HEP. Pt agreed to all and reports she has been feeling so much better. Pt demonstrated how she has been doing massage and demonstrated good technique.  Does still get up about 3x per night to urinate now but much lower than 10 prior to PT.    PATIENT EDUCATION:  Education details: [id number] Person educated: Patient Education method: Explanation, Demonstration, Tactile cues, Verbal cues, and Handouts Education comprehension: verbalized  understanding and returned demonstration  HOME EXERCISE PROGRAM: [id number]  ASSESSMENT:  CLINICAL IMPRESSION: Patient presents for treatment, session focused on discharge recommendations detailed above. Pt educated on all recommendations and denied questions. Pt reports she is comfortable with DC today and states she has been doing much better, reports she may ask to return after she has surgery but will wait to see what she needs. PT educated pt she would need new referral for future PT needs and she verbalized understanding.   OBJECTIVE IMPAIRMENTS: decreased coordination, decreased endurance, decreased mobility, decreased strength, increased fascial restrictions, impaired flexibility, improper body mechanics, postural dysfunction, and pain.   ACTIVITY LIMITATIONS: carrying, lifting, squatting, and continence  PARTICIPATION LIMITATIONS: community activity  PERSONAL FACTORS: Time since onset of injury/illness/exacerbation and 1 comorbidity: x2 vaginal births one with severe tearing  are also affecting patient's functional outcome.   REHAB POTENTIAL: Good  CLINICAL DECISION MAKING: Stable/uncomplicated  EVALUATION COMPLEXITY: Low   GOALS: Goals reviewed with patient? Yes  SHORT TERM GOALS: Target date: 02/14/23  Pt to be I with HEP.  Baseline: Goal status: MET  2.  Pt to demonstrate at least 3/5 pelvic floor strength for improved pelvic stability and decreased strain at pelvic floor/ decrease leakage.  Baseline:  Goal status: pt declined today as she denies need reporting she is feeling much better.   3.  Pt to be I with pressure management techniques for decreased strain at pelvic floor and prolapse Baseline:  Goal status: MET  4.  Pt to be I with breathing mechanics and voiding mechanics to decrease strain at pelvic floor and prolapse. Baseline:  Goal status: MET   LONG TERM GOALS: Target date: 04/26/23  Pt to be I with advanced HEP.  Baseline:  Goal status:  MET  2.  Pt to demonstrate at least 3/5 pelvic floor strength and ability to fully contract for 8 s and relax post contraction to for improved pelvic stability and decreased strain at pelvic floor/ decrease leakage.  Baseline:  Goal status: pt declined today as she denies need reporting she is feeling much better.   3.  Pt to demonstrate improved coordination of pelvic floor and breathing mechanics with body weight squat with appropriate synergistic patterns to decrease pain and leakage at least 75% of the time.    Baseline:  Goal status: not met - focus on relaxation technique and pt demonstrates difficulty with relaxing abdomen but able with minimal cues.   4.  Pt to be I with relaxation techniques to decreased straining with attempts to urinate. Baseline:  Goal status: MET  5.  Pt to report ability to void fully at least 75% of time for decreased feeling of retention.  Baseline:  Goal status: MET  PLAN:  PT FREQUENCY: 1x/week  PT DURATION:  8 sessions  PLANNED INTERVENTIONS: Therapeutic exercises, Therapeutic activity, Neuromuscular re-education, Patient/Family education, Self Care, Joint  mobilization, Aquatic Therapy, Dry Needling, Electrical stimulation, Spinal mobilization, Cryotherapy, Moist heat, scar mobilization, Taping, Biofeedback, and Manual therapy  PLAN FOR NEXT SESSION:  PHYSICAL THERAPY DISCHARGE SUMMARY  Visits from Start of Care: 10  Current functional level related to goals / functional outcomes: All STG met except internal pelvic floor as pt declined this. 3/5 LTG met    Remaining deficits: Does still have prolapse but reports she didn't want to work more on this and wanted to have surgery, pain is much lower and able to empty bladder more now post PT.    Education / Equipment: HEP   Patient agrees to discharge. Patient goals were partially met. Patient is being discharged due to being pleased with the current functional level.   Otelia Sergeant, PT,  DPT 10/29/244:28 PM

## 2023-07-06 ENCOUNTER — Encounter: Payer: Self-pay | Admitting: Physical Therapy

## 2023-07-13 ENCOUNTER — Other Ambulatory Visit: Payer: Self-pay | Admitting: Obstetrics and Gynecology

## 2023-07-13 DIAGNOSIS — N8110 Cystocele, unspecified: Secondary | ICD-10-CM

## 2023-07-13 DIAGNOSIS — N816 Rectocele: Secondary | ICD-10-CM

## 2023-07-21 DIAGNOSIS — M170 Bilateral primary osteoarthritis of knee: Secondary | ICD-10-CM | POA: Diagnosis not present

## 2023-07-21 DIAGNOSIS — M1711 Unilateral primary osteoarthritis, right knee: Secondary | ICD-10-CM | POA: Diagnosis not present

## 2023-07-21 DIAGNOSIS — M1712 Unilateral primary osteoarthritis, left knee: Secondary | ICD-10-CM | POA: Diagnosis not present

## 2023-07-30 ENCOUNTER — Other Ambulatory Visit: Payer: Self-pay | Admitting: Family Medicine

## 2023-07-30 DIAGNOSIS — I10 Essential (primary) hypertension: Secondary | ICD-10-CM

## 2023-08-15 ENCOUNTER — Other Ambulatory Visit: Payer: Self-pay | Admitting: Family Medicine

## 2023-08-15 DIAGNOSIS — I10 Essential (primary) hypertension: Secondary | ICD-10-CM

## 2023-08-15 DIAGNOSIS — E782 Mixed hyperlipidemia: Secondary | ICD-10-CM

## 2023-08-18 ENCOUNTER — Ambulatory Visit: Payer: PRIVATE HEALTH INSURANCE | Admitting: Family Medicine

## 2023-08-21 ENCOUNTER — Other Ambulatory Visit: Payer: Self-pay

## 2023-08-21 ENCOUNTER — Other Ambulatory Visit: Payer: Medicare Other

## 2023-08-21 DIAGNOSIS — E890 Postprocedural hypothyroidism: Secondary | ICD-10-CM | POA: Diagnosis not present

## 2023-08-22 LAB — T4, FREE: Free T4: 1.7 ng/dL (ref 0.8–1.8)

## 2023-08-22 LAB — TSH: TSH: 0.13 m[IU]/L — ABNORMAL LOW (ref 0.40–4.50)

## 2023-08-23 ENCOUNTER — Telehealth: Payer: Self-pay | Admitting: Internal Medicine

## 2023-08-23 DIAGNOSIS — M170 Bilateral primary osteoarthritis of knee: Secondary | ICD-10-CM | POA: Diagnosis not present

## 2023-08-23 NOTE — Telephone Encounter (Signed)
LMTCB

## 2023-08-23 NOTE — Telephone Encounter (Signed)
Please let the patient know that her thyroid test is improving, I would continue to take levothyroxine Monday through Saturday and none on Sundays.    Thanks

## 2023-08-24 NOTE — Telephone Encounter (Signed)
LMTCB

## 2023-08-25 ENCOUNTER — Ambulatory Visit (INDEPENDENT_AMBULATORY_CARE_PROVIDER_SITE_OTHER): Payer: Medicare Other | Admitting: Family Medicine

## 2023-08-25 ENCOUNTER — Encounter: Payer: Self-pay | Admitting: Family Medicine

## 2023-08-25 VITALS — BP 120/60 | HR 56 | Temp 98.4°F | Wt 141.8 lb

## 2023-08-25 DIAGNOSIS — R339 Retention of urine, unspecified: Secondary | ICD-10-CM | POA: Diagnosis not present

## 2023-08-25 DIAGNOSIS — F418 Other specified anxiety disorders: Secondary | ICD-10-CM

## 2023-08-25 DIAGNOSIS — I10 Essential (primary) hypertension: Secondary | ICD-10-CM | POA: Diagnosis not present

## 2023-08-25 MED ORDER — CITALOPRAM HYDROBROMIDE 10 MG PO TABS: 10.0000 mg | ORAL_TABLET | Freq: Every day | ORAL | 3 refills | Status: DC

## 2023-08-25 MED ORDER — NITROFURANTOIN MACROCRYSTAL 25 MG PO CAPS: 25.0000 mg | ORAL_CAPSULE | Freq: Every day | ORAL | 3 refills | Status: DC

## 2023-08-25 MED ORDER — EPINEPHRINE 0.3 MG/0.3ML IJ SOAJ: 0.3000 mg | INTRAMUSCULAR | 11 refills | Status: DC | PRN

## 2023-08-25 NOTE — Progress Notes (Signed)
   Subjective:    Patient ID: Marjean Donna, female    DOB: [birth year], 88 y.o.   MRN: [id number]  HPI Here to review her medications. She has ben taking 1/2 of a Citalopram tablet daily, and this has been working well. She asks to refill the EpiPen. She also wants to try something different for UTI prophylaxis. She has been taking Trimethoprim for several years. She typically takes Nitrofurantoin when she gets a UTI. Her BP has been stable.    Review of Systems  Constitutional: Negative.   Respiratory: Negative.    Cardiovascular: Negative.   Gastrointestinal: Negative.   Genitourinary: Negative.        Objective:   Physical Exam Constitutional:      Appearance: Normal appearance.  Cardiovascular:     Rate and Rhythm: Normal rate and regular rhythm.     Pulses: Normal pulses.     Heart sounds: Normal heart sounds.  Pulmonary:     Effort: Pulmonary effort is normal.     Breath sounds: Normal breath sounds.  Neurological:     Mental Status: She is alert.           Assessment & Plan:  Her depression and anxiety are stable, so we will change the Citalopram so she can take a 10 mg tablet daily. We refilled the EpiPen. For UTI prophylaxis, we will stop Trimethoprim and start on Nitrofurantoin 25 mg daily.  Gershon Crane, MD

## 2023-08-26 ENCOUNTER — Other Ambulatory Visit: Payer: Self-pay | Admitting: Family Medicine

## 2023-08-26 DIAGNOSIS — I10 Essential (primary) hypertension: Secondary | ICD-10-CM

## 2023-08-31 ENCOUNTER — Telehealth: Payer: Self-pay

## 2023-08-31 NOTE — Telephone Encounter (Signed)
Patient given results and medication changes as directed by MD. No further questions at this time. Patient requested copy of labwork results. Copy left for patient at front desk

## 2023-09-01 ENCOUNTER — Telehealth: Payer: Self-pay | Admitting: Pharmacy Technician

## 2023-09-01 ENCOUNTER — Other Ambulatory Visit (HOSPITAL_COMMUNITY): Payer: Self-pay

## 2023-09-01 DIAGNOSIS — R2689 Other abnormalities of gait and mobility: Secondary | ICD-10-CM | POA: Diagnosis not present

## 2023-09-01 DIAGNOSIS — R531 Weakness: Secondary | ICD-10-CM | POA: Diagnosis not present

## 2023-09-01 NOTE — Telephone Encounter (Signed)
Pharmacy Patient Advocate Encounter  Received notification from [facility] that Prior Authorization for Nitrofurantoin Macrocrystal 25MG  capsules has been APPROVED from 09/01/23 to 09/04/24   PA #/Case ID/Reference #: [id number]

## 2023-09-01 NOTE — Telephone Encounter (Signed)
Left pt a message on her voicemail with details of Rx approval

## 2023-09-01 NOTE — Telephone Encounter (Signed)
Pharmacy Patient Advocate Encounter   Received notification from CoverMy[employer]eds that prior authorization for Nitrofurantoin Macrocrystal 25MG  capsules is required/requested.   Insurance verification completed.   The patient is insured through [facility] .   Per test claim: PA required; PA submitted to above mentioned insurance via CoverMyMeds Key/confirmation #/EOC Nitrofurantoin Macrocrystal 25MG  capsules Status is pending

## 2023-09-01 NOTE — Telephone Encounter (Signed)
Patient picked up printed lab results

## 2023-09-01 NOTE — Telephone Encounter (Signed)
Key: [facility]

## 2023-09-08 DIAGNOSIS — R531 Weakness: Secondary | ICD-10-CM | POA: Diagnosis not present

## 2023-09-08 DIAGNOSIS — R2689 Other abnormalities of gait and mobility: Secondary | ICD-10-CM | POA: Diagnosis not present

## 2023-09-13 DIAGNOSIS — R2689 Other abnormalities of gait and mobility: Secondary | ICD-10-CM | POA: Diagnosis not present

## 2023-09-13 DIAGNOSIS — R531 Weakness: Secondary | ICD-10-CM | POA: Diagnosis not present

## 2023-09-20 DIAGNOSIS — R531 Weakness: Secondary | ICD-10-CM | POA: Diagnosis not present

## 2023-09-20 DIAGNOSIS — R2689 Other abnormalities of gait and mobility: Secondary | ICD-10-CM | POA: Diagnosis not present

## 2023-09-27 DIAGNOSIS — R2689 Other abnormalities of gait and mobility: Secondary | ICD-10-CM | POA: Diagnosis not present

## 2023-09-27 DIAGNOSIS — R531 Weakness: Secondary | ICD-10-CM | POA: Diagnosis not present

## 2023-10-09 DIAGNOSIS — M170 Bilateral primary osteoarthritis of knee: Secondary | ICD-10-CM | POA: Diagnosis not present

## 2023-10-25 ENCOUNTER — Other Ambulatory Visit: Payer: Self-pay | Admitting: Family Medicine

## 2023-10-25 DIAGNOSIS — I10 Essential (primary) hypertension: Secondary | ICD-10-CM

## 2023-12-27 ENCOUNTER — Ambulatory Visit (INDEPENDENT_AMBULATORY_CARE_PROVIDER_SITE_OTHER): Payer: PRIVATE HEALTH INSURANCE | Admitting: Internal Medicine

## 2023-12-27 ENCOUNTER — Encounter: Payer: Self-pay | Admitting: Internal Medicine

## 2023-12-27 VITALS — BP 110/68 | HR 59 | Ht 63.0 in | Wt 144.0 lb

## 2023-12-27 DIAGNOSIS — E890 Postprocedural hypothyroidism: Secondary | ICD-10-CM

## 2023-12-27 NOTE — Progress Notes (Unsigned)
 Name: [name] [name]  MRN/ DOB: [id number], 1935/03/06    Age/ Sex: 89 y.o., female    PCP: Donley Furth, MD   Reason for Endocrinology Evaluation: Hypothyroidism     Date of Initial Endocrinology Evaluation: 07/22/2022    HPI: [name] [name] is a 89 y.o. female with a past medical history of Htn, Dyslipidemia and RA. The patient presented for initial endocrinology clinic visit on 07/22/2022 for consultative assistance with her Hypothyroidism.   She is S/P total thyroidectomy 08/1987 due to Follicular thyroid  carcinoma. She is S/P RAI ablation 1989    Her last thyroid  ultrasound was in 2014 with NO evidence of recurrence   TG and TG antibodies were  negative -07/2022  SUBJECTIVE:    Today (12/27/23):  [name] [name] is here for follow-up on post operative hypothyroid  She continues to follow-up with orthopedics for knee osteoarthritis She is scheduled for colporrhaphy for cystocele repair 01/2024   No local neck swelling  She has IBS with bowel movement changes  Denies tremors  Denies palpitations  Continues with insomnia at night for the past 2 years, she attributed this to her thyroid    Levothyroxine  112 mcg , 1 tablet Monday through Saturday and none on Sundays   HISTORY:  Past Medical History:  Past Medical History:  Diagnosis Date   Allergic angioedema    from shellfish and tree nuts   Chest pain 2000 and 2001   negative cardiac workup   Complication of anesthesia    Diverticulosis    Fibrocystic breast disease    Hyperlipidemia    Hypertension    Irritable bowel syndrome    Perirectal cyst    ?lymphocoele   PMR (polymyalgia rheumatica) (HCC) 2011   PONV (postoperative nausea and vomiting)    Seborrheic dermatitis    Squamous cell skin cancer    X1, [facility]   Thyroid  disease    post op hypothyroidism; TSH goal would be sup;pressive   Past Surgical History:  Past Surgical History:  Procedure Laterality Date   BILIARY DILATION   04/23/2021   Procedure: BILIARY DILATION;  Surgeon: Lajuan Pila, MD;  Location: [facility];  Service: Endoscopy;;   CHOLECYSTECTOMY N/A 04/24/2021   Procedure: LAPAROSCOPIC CHOLECYSTECTOMY;  Surgeon: Melvenia Stabs, MD;  Location: [facility];  Service: General;  Laterality: N/A;  90   COLONOSCOPY  2004   per Dr. Grandville Lax, clear but incomplete; Barium study added, repeat in 10 yrs    DILATION AND CURETTAGE OF UTERUS     ERCP N/A 04/23/2021   Procedure: ENDOSCOPIC RETROGRADE CHOLANGIOPANCREATOGRAPHY (ERCP);  Surgeon: Lajuan Pila, MD;  Location: [facility];  Service: Endoscopy;  Laterality: N/A;   ESOPHAGOGASTRODUODENOSCOPY (EGD) WITH PROPOFOL  N/A 07/21/2018   Procedure: ESOPHAGOGASTRODUODENOSCOPY (EGD) WITH PROPOFOL ;  Surgeon: Lajuan Pila, MD;  [facility];  Service: Endoscopy;  Laterality: N/A;   ESOPHAGOGASTRODUODENOSCOPY (EGD) WITH PROPOFOL  N/A 04/23/2021   Procedure: ESOPHAGOGASTRODUODENOSCOPY (EGD) WITH PROPOFOL ;  Surgeon: Lajuan Pila, MD;  [facility];  Service: Endoscopy;  Laterality: N/A;   exporatory surgery for rectal mass  1986   peri rectal cyst   FOREIGN BODY REMOVAL  07/21/2018   Procedure: FOREIGN BODY REMOVAL;  Surgeon: Lajuan Pila, MD;  [facility];  Service: Endoscopy;;   REMOVAL OF STONES  04/23/2021   Procedure: REMOVAL OF STONES;  Surgeon: Lajuan Pila, MD;  [facility];  Service: Endoscopy;;   SPHINCTEROTOMY  04/23/2021   Procedure: [address];  Surgeon: Lajuan Pila, MD;  [facility];  Service: Endoscopy;;   THYROIDECTOMY  1988   S/P radiation previously seen by Reda Canary, MD @ [facility], now followed by Dr. Earmon Glow   TONSILLECTOMY  [phone]    Social History:  reports that she has never smoked. She has never used smokeless tobacco. She reports current alcohol use. She reports that she does not use drugs. Family History: family history includes Breast cancer in her maternal grandmother; Heart attack  (age of onset: 47) in her paternal uncle; Heart attack (age of onset: 53) in her paternal grandfather; Hypertension in her maternal grandmother and mother; Parkinsonism in her brother; Prostate cancer in her father.   HOME MEDICATIONS: Allergies as of 12/27/2023       Reactions   Horse-derived Products    Redness & swelling of arm   Other Anaphylaxis   Tree nuts (not peanuts)  Other reaction(s): Other (See Comments) Uncoded Allergy. Allergen: Other Reaction: red and swelling   Penicillins    Rash 04/20/2021 tolerated Zosyn    Shellfish Allergy Other (See Comments)   Other Reaction: throat closes up   Shellfish-derived Products    Throat swelling   Bacitracin-polymyxin B    Local swelling   Egg-derived Products    REACTION: Unable to get Flu Vaccine Skin Tests+ for egg products   Neomycin-bacitracin Zn-polymyx         Medication List        Accurate as of December 27, 2023  1:05 PM. If you have any questions, ask your nurse or doctor.          CALCIUM 500 PO Take 1 tablet by mouth daily at 6 (six) AM.   citalopram  10 MG tablet Commonly known as: CELEXA  Take 1 tablet (10 mg total) by mouth daily.   EPINEPHrine  0.3 mg/0.3 mL Soaj injection Commonly known as: EPI-PEN Inject 0.3 mg into the muscle as needed for anaphylaxis.   estradiol  0.1 MG/GM vaginal cream Commonly known as: ESTRACE  Place 0.5 g vaginally 2 (two) times a week. Place 0.5g nightly for two weeks then twice a week after   levothyroxine  112 MCG tablet Commonly known as: SYNTHROID  Take 1 tablet (112 mcg total) by mouth as directed. 1 tablet Monday through Saturday and none on Sunday   metoprolol  tartrate 100 MG tablet Commonly known as: LOPRESSOR  TAKE 1 TABLET BY MOUTH EVERY MORNING, AND 1/2 TABLET EVERY EVENING   nitrofurantoin  25 MG capsule Commonly known as: MACRODANTIN  Take 1 capsule (25 mg total) by mouth at bedtime.   pravastatin  40 MG tablet Commonly known as: PRAVACHOL  TAKE 1 TABLET(40  MG) BY MOUTH DAILY.   spironolactone  25 MG tablet Commonly known as: ALDACTONE  TAKE 1 TABLET(25 MG) BY MOUTH DAILY   VITAMIN D3 GUMMIES PO Take 1 tablet by mouth daily at 6 (six) AM.          REVIEW OF SYSTEMS: A comprehensive ROS was conducted with the patient and is negative except as per HPI   OBJECTIVE:  VS: BP 110/68 (BP Location: Left Arm, Patient Position: Sitting, Cuff Size: Normal)   Pulse (!) 59   Ht 5\' 3"  (1.6 m)   Wt 144 lb (65.3 kg)   SpO2 97%   BMI 25.51 kg/m    Wt Readings from Last 3 Encounters:  12/27/23 144 lb (65.3 kg)  08/25/23 141 lb 12.8 oz (64.3 kg)  06/28/23 145 lb 9.6 oz (66 kg)     EXAM: General: Pt appears well and is in NAD  Neck: General:  Supple without adenopathy. Thyroid : No goiter or nodules appreciated.  Lungs: Clear with good BS bilat   Heart: Auscultation: RRR.  Extremities:  BL LE: No pretibial edema normal ROM and strength.  Mental Status: Judgment, insight: Intact Orientation: Oriented to time, place, and person Mood and affect: No depression, anxiety, or agitation     DATA REVIEWED:     Latest Reference Range & Units 07/22/22 13:50  Thyroglobulin ng/mL 0.1 (L)  Thyroglobulin Ab < or = 1 IU/mL <1     ASSESSMENT/PLAN/RECOMMENDATIONS:   Postoperative Hypothyroidism:  -Patient is complaining of hair loss, and is wondering if we can increase the dose if her TFTs are lowered, unfortunately her TSH is suppressed with elevated free T4 -She has been noted with drastic changes in her TSH with variable levothyroxine  dose, she assures me compliance -I will decrease levothyroxine  as below and recheck TFTs in 2 months  Medications :  Decrease levothyroxine  112 mcg , 1 tablet Monday through Saturday and none on Sundays    2. Hx of Follicular thyroid  carcinoma  - She is > 10 yrs  out from total thyroidectomy  - S/P RAI ablation in 1989 - Last ultrasound in 2014 showed no evidence of recurrence  - Tg , Tg Ab 's  undetectable     F/U in 6 months    Signed electronically by: Natale Bail, MD  [facility] Endocrinology  [facility] [address] [place] [place], [place] [address] Phone: [phone] FAX: [phone]   CC: Donley Furth, MD [address] [place] [address] Phone: [phone] Fax: [phone]   Return to Endocrinology clinic as below: Future Appointments  Date Time Provider Department Center  01/03/2024 11:20 AM Arma Lamp, MD [facility]  03/06/2024  2:20 PM Arma Lamp, MD [facility]

## 2023-12-27 NOTE — Patient Instructions (Signed)

## 2023-12-28 ENCOUNTER — Telehealth: Payer: Self-pay | Admitting: Internal Medicine

## 2023-12-28 LAB — TSH: TSH: 0.45 m[IU]/L (ref 0.40–4.50)

## 2023-12-28 LAB — T4, FREE: Free T4: 1.5 ng/dL (ref 0.8–1.8)

## 2023-12-28 NOTE — Telephone Encounter (Signed)
 Please let the patient know that her thyroid  has normalized, and to continue to take levothyroxine  112 mcg daily 6 days out of the week skipping 1 day a week.   Thanks

## 2023-12-28 NOTE — Telephone Encounter (Signed)
 Patient notified and lab result mailed out

## 2024-01-03 ENCOUNTER — Ambulatory Visit (INDEPENDENT_AMBULATORY_CARE_PROVIDER_SITE_OTHER): Payer: PRIVATE HEALTH INSURANCE | Admitting: Obstetrics and Gynecology

## 2024-01-03 ENCOUNTER — Encounter: Payer: Self-pay | Admitting: Obstetrics and Gynecology

## 2024-01-03 VITALS — BP 117/69 | HR 58 | Wt 144.8 lb

## 2024-01-03 DIAGNOSIS — Z01818 Encounter for other preprocedural examination: Secondary | ICD-10-CM

## 2024-01-03 DIAGNOSIS — N952 Postmenopausal atrophic vaginitis: Secondary | ICD-10-CM

## 2024-01-03 MED ORDER — ESTROGENS CONJUGATED 0.625 MG/GM VA CREA: TOPICAL_CREAM | VAGINAL | 5 refills | Status: AC

## 2024-01-03 MED ORDER — POLYETHYLENE GLYCOL 3350 17 GM/SCOOP PO POWD: 17.0000 g | Freq: Every day | ORAL | 0 refills | Status: AC

## 2024-01-03 MED ORDER — ACETAMINOPHEN 500 MG PO TABS: 500.0000 mg | ORAL_TABLET | Freq: Four times a day (QID) | ORAL | 0 refills | Status: AC | PRN

## 2024-01-03 MED ORDER — OXYCODONE HCL 5 MG PO TABS: 5.0000 mg | ORAL_TABLET | ORAL | 0 refills | Status: DC | PRN

## 2024-01-03 MED ORDER — IBUPROFEN 600 MG PO TABS: 600.0000 mg | ORAL_TABLET | Freq: Four times a day (QID) | ORAL | 0 refills | Status: AC | PRN

## 2024-01-03 NOTE — H&P (Signed)
 [name] [name]  History of Present Illness: [name] [name] is a 89 y.o. female.  She is scheduled to undergo  Exam under anesthesia, anterior and posterior repair, cystoscopy on 01/22/24.  Her symptoms include vaginal bulge and incomplete emptying, and she was was found to have Stage II anterior, Stage II posterior, Stage 0 apical [name].   Urodynamic Impression:  1. Sensation was normal; capacity was normal 2. Stress Incontinence was not demonstrated at normal pressures; 3. Detrusor Overactivity was not demonstrated. 4. Emptying was dysfunctional with a elevated PVR ( ), a sustained detrusor contraction present,  abdominal straining present, dyssynergic urethral sphincter activity on EMG.    Past Medical History:  Diagnosis Date   Allergic angioedema    from shellfish and tree nuts   Chest pain 2000 and 2001   negative cardiac workup   Complication of anesthesia    Diverticulosis    Fibrocystic breast disease    Hyperlipidemia    Hypertension    Irritable bowel syndrome    Perirectal cyst    ?lymphocoele   PMR (polymyalgia rheumatica) (HCC) 2011   PONV (postoperative nausea and vomiting)    Seborrheic dermatitis    Squamous cell skin cancer    X1, [facility]   Thyroid  disease    post op hypothyroidism; TSH goal would be sup;pressive     Past Surgical History:  Procedure Laterality Date   BILIARY DILATION  04/23/2021   Procedure: BILIARY DILATION;  Surgeon: Lajuan Pila, MD;  [facility];  Service: Endoscopy;;   CHOLECYSTECTOMY N/A 04/24/2021   Procedure: LAPAROSCOPIC CHOLECYSTECTOMY;  Surgeon: Melvenia Stabs, MD;  Location: [facility];  Service: General;  Laterality: N/A;  90   COLONOSCOPY  2004   per Dr. Grandville Lax, clear but incomplete; Barium study added, repeat in 10 yrs    DILATION AND CURETTAGE OF UTERUS     ERCP N/A 04/23/2021   Procedure:  ENDOSCOPIC RETROGRADE CHOLANGIOPANCREATOGRAPHY (ERCP);  Surgeon: Lajuan Pila, MD;  Location: [facility];  Service: Endoscopy;  Laterality: N/A;   ESOPHAGOGASTRODUODENOSCOPY (EGD) WITH PROPOFOL  N/A 07/21/2018   Procedure: ESOPHAGOGASTRODUODENOSCOPY (EGD) WITH PROPOFOL ;  Surgeon: Lajuan Pila, MD;  [facility];  Service: Endoscopy;  Laterality: N/A;   ESOPHAGOGASTRODUODENOSCOPY (EGD) WITH PROPOFOL  N/A 04/23/2021   Procedure: ESOPHAGOGASTRODUODENOSCOPY (EGD) WITH PROPOFOL ;  Surgeon: Lajuan Pila, MD;  [facility];  Service: Endoscopy;  Laterality: N/A;   exporatory surgery for rectal mass  1986   peri rectal cyst   FOREIGN BODY REMOVAL  07/21/2018   Procedure: FOREIGN BODY REMOVAL;  Surgeon: Lajuan Pila, MD;  [facility];  Service: Endoscopy;;   REMOVAL OF STONES  04/23/2021   Procedure: REMOVAL OF STONES;  Surgeon: Lajuan Pila, MD;  [facility];  Service: Endoscopy;;   SPHINCTEROTOMY  04/23/2021   Procedure: [address];  Surgeon: Lajuan Pila, MD;  [facility];  Service: Endoscopy;;   THYROIDECTOMY  1988   S/P radiation previously seen by Reda Canary, MD @ [facility], now followed by Dr. Earmon Glow   TONSILLECTOMY  [phone]    is allergic to horse-derived products, other, penicillins, shellfish allergy, shellfish-derived products, bacitracin-polymyxin b, egg-derived products, and neomycin-bacitracin zn-polymyx.   Family History  Problem Relation Age of Onset   Hypertension Mother    Prostate cancer Father    Parkinsonism Brother    Heart attack Paternal Uncle 47   Breast cancer Maternal Grandmother    Hypertension Maternal Grandmother  Heart attack Paternal Grandfather 39    Social History   Tobacco Use   Smoking status: Never   Smokeless tobacco: Never  Vaping Use   Vaping status: Never Used  Substance Use Topics   Alcohol use: Yes    Alcohol/week: 0.0 standard drinks of alcohol    Comment: very rarely   Drug  use: No     Review of Systems was negative for a full 10 system review except as noted in the History of Present Illness.  No current facility-administered medications for this encounter.  Current Outpatient Medications:    acetaminophen  (TYLENOL ) 500 MG tablet, Take 1 tablet (500 mg total) by mouth every 6 (six) hours as needed (pain)., Disp: 30 tablet, Rfl: 0   Calcium Carbonate (CALCIUM 500 PO), Take 1 tablet by mouth daily at 6 (six) AM., Disp: , Rfl:    Cholecalciferol (VITAMIN D3 GUMMIES PO), Take 1 tablet by mouth daily at 6 (six) AM., Disp: , Rfl:    citalopram  (CELEXA ) 10 MG tablet, Take 1 tablet (10 mg total) by mouth daily., Disp: 30 tablet, Rfl: 3   [START ON 01/04/2024] conjugated estrogens (PREMARIN) vaginal cream, Place 0.5g nightly in vagina for two weeks then twice a week after, Disp: 30 g, Rfl: 5   EPINEPHrine  0.3 mg/0.3 mL IJ SOAJ injection, Inject 0.3 mg into the muscle as needed for anaphylaxis., Disp: 1 each, Rfl: 11   ibuprofen (ADVIL) 600 MG tablet, Take 1 tablet (600 mg total) by mouth every 6 (six) hours as needed., Disp: 30 tablet, Rfl: 0   levothyroxine  (SYNTHROID ) 112 MCG tablet, Take 1 tablet (112 mcg total) by mouth as directed. 1 tablet Monday through Saturday and none on Sunday, Disp: 78 tablet, Rfl: 2   metoprolol  tartrate (LOPRESSOR ) 100 MG tablet, TAKE 1 TABLET BY MOUTH EVERY MORNING, AND 1/2 TABLET EVERY EVENING, Disp: 135 tablet, Rfl: 3   nitrofurantoin  (MACRODANTIN ) 25 MG capsule, Take 1 capsule (25 mg total) by mouth at bedtime., Disp: 90 capsule, Rfl: 3   oxyCODONE (OXY IR/ROXICODONE) 5 MG immediate release tablet, Take 1 tablet (5 mg total) by mouth every 4 (four) hours as needed for severe pain (pain score 7-10)., Disp: 5 tablet, Rfl: 0   polyethylene glycol powder (GLYCOLAX/MIRALAX) 17 GM/SCOOP powder, Take 17 g by mouth daily. Drink 17g (1 scoop) dissolved in water per day., Disp: 255 g, Rfl: 0   pravastatin  (PRAVACHOL ) 40 MG tablet, TAKE 1 TABLET(40  MG) BY MOUTH DAILY., Disp: 90 tablet, Rfl: 3   spironolactone  (ALDACTONE ) 25 MG tablet, TAKE 1 TABLET(25 MG) BY MOUTH DAILY, Disp: 90 tablet, Rfl: 0   Objective There were no vitals filed for this visit.   Gen: NAD CV: S1 S2 RRR Lungs: Clear to auscultation bilaterally Abd: soft, nontender   Previous Pelvic Exam showed: POP-Q   -0.5                                            Aa   -0.5                                           Ba   -8  C    2.5                                            Gh   4                                            Pb   9                                            tvl    -1.5                                            Ap   -1.5                                            Bp   -8.5                                              D        Assessment/ Plan  The patient is a 89 y.o. year old scheduled to undergo Exam under anesthesia, anterior and posterior repair, cystoscopy.    Arma Lamp, MD

## 2024-01-03 NOTE — Progress Notes (Signed)
 [facility] Return visit  Subjective Chief Complaint: [name] [name] presents for visit for prolapse  History of Present Illness: [name] [name] is a 89 y.o. female.  She is scheduled to undergo  Exam under anesthesia, anterior and posterior repair, cystoscopy on 01/22/24.  Her symptoms include vaginal bulge and incomplete emptying, and she was was found to have Stage II anterior, Stage II posterior, Stage 0 apical prolapse.   Urodynamic Impression:  1. Sensation was normal; capacity was normal 2. Stress Incontinence was not demonstrated at normal pressures; 3. Detrusor Overactivity was not demonstrated. 4. Emptying was dysfunctional with a elevated PVR ( ), a sustained detrusor contraction present,  abdominal straining present, dyssynergic urethral sphincter activity on EMG.    Past Medical History:  Diagnosis Date   Allergic angioedema    from shellfish and tree nuts   Chest pain 2000 and 2001   negative cardiac workup   Complication of anesthesia    Diverticulosis    Fibrocystic breast disease    Hyperlipidemia    Hypertension    Irritable bowel syndrome    Perirectal cyst    ?lymphocoele   PMR (polymyalgia rheumatica) (HCC) 2011   PONV (postoperative nausea and vomiting)    Seborrheic dermatitis    Squamous cell skin cancer    X1, [facility]   Thyroid  disease    post op hypothyroidism; TSH goal would be sup;pressive     Past Surgical History:  Procedure Laterality Date   BILIARY DILATION  04/23/2021   Procedure: BILIARY DILATION;  Surgeon: Lajuan Pila, MD;  Location: [facility];  Service: Endoscopy;;   CHOLECYSTECTOMY N/A 04/24/2021   Procedure: LAPAROSCOPIC CHOLECYSTECTOMY;  Surgeon: Melvenia Stabs, MD;  Location: [facility];  Service: General;  Laterality: N/A;  90   COLONOSCOPY  2004   per Dr. Grandville Lax, clear but incomplete; Barium study added, repeat in 10 yrs    DILATION AND CURETTAGE OF UTERUS     ERCP N/A 04/23/2021   Procedure:  ENDOSCOPIC RETROGRADE CHOLANGIOPANCREATOGRAPHY (ERCP);  Surgeon: Lajuan Pila, MD;  Location: [facility];  Service: Endoscopy;  Laterality: N/A;   ESOPHAGOGASTRODUODENOSCOPY (EGD) WITH PROPOFOL  N/A 07/21/2018   Procedure: ESOPHAGOGASTRODUODENOSCOPY (EGD) WITH PROPOFOL ;  Surgeon: Lajuan Pila, MD;  Location: [facility];  Service: Endoscopy;  Laterality: N/A;   ESOPHAGOGASTRODUODENOSCOPY (EGD) WITH PROPOFOL  N/A 04/23/2021   Procedure: ESOPHAGOGASTRODUODENOSCOPY (EGD) WITH PROPOFOL ;  Surgeon: Lajuan Pila, MD;  Location: [facility];  Service: Endoscopy;  Laterality: N/A;   exporatory surgery for rectal mass  1986   peri rectal cyst   FOREIGN BODY REMOVAL  07/21/2018   Procedure: FOREIGN BODY REMOVAL;  Surgeon: Lajuan Pila, MD;  Location: [facility];  Service: Endoscopy;;   REMOVAL OF STONES  04/23/2021   Procedure: REMOVAL OF STONES;  Surgeon: Lajuan Pila, MD;  Location: [facility];  Service: Endoscopy;;   SPHINCTEROTOMY  04/23/2021   Procedure: [address];  Surgeon: Lajuan Pila, MD;  Location: [facility];  Service: Endoscopy;;   THYROIDECTOMY  1988   S/P radiation previously seen by Reda Canary, MD @ [facility], now followed by Dr. Earmon Glow   TONSILLECTOMY  [phone]    is allergic to horse-derived products, other, penicillins, shellfish allergy, shellfish-derived products, bacitracin-polymyxin b, egg-derived products, and neomycin-bacitracin zn-polymyx.   Family History  Problem Relation Age of Onset   Hypertension Mother    Prostate cancer Father    Parkinsonism Brother    Heart attack Paternal Uncle 62   Breast cancer Maternal Grandmother    Hypertension Maternal Grandmother  Heart attack Paternal Grandfather 22    Social History   Tobacco Use   Smoking status: Never   Smokeless tobacco: Never  Vaping Use   Vaping status: Never Used  Substance Use Topics   Alcohol use: Yes    Alcohol/week: 0.0 standard drinks of alcohol    Comment: very rarely   Drug  use: No     Review of Systems was negative for a full 10 system review except as noted in the History of Present Illness.   Current Outpatient Medications:    Calcium Carbonate (CALCIUM 500 PO), Take 1 tablet by mouth daily at 6 (six) AM., Disp: , Rfl:    Cholecalciferol (VITAMIN D3 GUMMIES PO), Take 1 tablet by mouth daily at 6 (six) AM., Disp: , Rfl:    citalopram  (CELEXA ) 10 MG tablet, Take 1 tablet (10 mg total) by mouth daily., Disp: 30 tablet, Rfl: 3   EPINEPHrine  0.3 mg/0.3 mL IJ SOAJ injection, Inject 0.3 mg into the muscle as needed for anaphylaxis., Disp: 1 each, Rfl: 11   estradiol  (ESTRACE ) 0.1 MG/GM vaginal cream, Place 0.5 g vaginally 2 (two) times a week. Place 0.5g nightly for two weeks then twice a week after, Disp: 30 g, Rfl: 11   levothyroxine  (SYNTHROID ) 112 MCG tablet, Take 1 tablet (112 mcg total) by mouth as directed. 1 tablet Monday through Saturday and none on Sunday, Disp: 78 tablet, Rfl: 2   metoprolol  tartrate (LOPRESSOR ) 100 MG tablet, TAKE 1 TABLET BY MOUTH EVERY MORNING, AND 1/2 TABLET EVERY EVENING, Disp: 135 tablet, Rfl: 3   nitrofurantoin  (MACRODANTIN ) 25 MG capsule, Take 1 capsule (25 mg total) by mouth at bedtime., Disp: 90 capsule, Rfl: 3   pravastatin  (PRAVACHOL ) 40 MG tablet, TAKE 1 TABLET(40 MG) BY MOUTH DAILY., Disp: 90 tablet, Rfl: 3   spironolactone  (ALDACTONE ) 25 MG tablet, TAKE 1 TABLET(25 MG) BY MOUTH DAILY, Disp: 90 tablet, Rfl: 0   Objective Vitals:   01/03/24 1130  BP: 117/69  Pulse: (!) 58    Gen: NAD CV: S1 S2 RRR Lungs: Clear to auscultation bilaterally Abd: soft, nontender   Previous Pelvic Exam showed: POP-Q   -0.5                                            Aa   -0.5                                           Ba   -8                                              C    2.5                                            Gh   4  Pb   9                                            tvl     -1.5                                            Ap   -1.5                                            Bp   -8.5                                              D        Assessment/ Plan  Assessment: The patient is a 89 y.o. year old scheduled to undergo Exam under anesthesia, anterior and posterior repair, cystoscopy. Verbal consent was obtained for these procedures.  Plan: General Surgical Consent: The patient has previously been counseled on alternative treatments, and the decision by the patient and provider was to proceed with the procedure listed above.  For all procedures, there are risks of bleeding, infection, damage to surrounding organs including but not limited to bowel, bladder, blood vessels, ureters and nerves, and need for further surgery if an injury were to occur. These risks are all low with minimally invasive surgery.   There are risks of numbness and weakness at any body site or buttock/rectal pain.  It is possible that baseline pain can be worsened by surgery, either with or without mesh. If surgery is vaginal, there is also a low risk of possible conversion to laparoscopy or open abdominal incision where indicated. Very rare risks include blood transfusion, blood clot, heart attack, pneumonia, or death.   There is also a risk of short-term postoperative urinary retention with need to use a catheter. About half of patients need to go home from surgery with a catheter, which is then later removed in the office. The risk of long-term need for a catheter is very low. There is also a risk of worsening of overactive bladder.  .    Prolapse (with or without mesh): Risk factors for surgical failure  include things that put pressure on your pelvis and the surgical repair, including obesity, chronic cough, and heavy lifting or straining (including lifting children or adults, straining on the toilet, or lifting heavy objects such as furniture or anything weighing >25 lbs. Risks of  recurrence is 20-30% with vaginal native tissue repair and a less than 10% with sacrocolpopexy with mesh.     We discussed consent for blood products. Risks for blood transfusion include allergic reactions, other reactions that can affect different body organs and managed accordingly, transmission of infectious diseases such as HIV or Hepatitis. However, the blood is screened. Patient consents for blood products.  Pre-operative instructions:  She was instructed to not take Aspirin/NSAIDs x 7days prior to surgery.  Antibiotic prophylaxis was ordered as indicated.  Catheter use: Patient will go home with foley if needed after post-operative voiding trial.  Post-operative instructions:  She was provided with specific post-operative instructions, including precautions and signs/symptoms for which we would recommend contacting us , in addition to daytime and after-hours contact phone numbers. This was provided on a handout.   Post-operative medications: Prescriptions for motrin, tylenol , miralax, and oxycodone were sent to her pharmacy. Discussed using ibuprofen and tylenol  on a schedule to limit use of narcotics.   Laboratory testing:  no labs needed  Preoperative clearance:  She does not require surgical clearance.    Post-operative follow-up:  A post-operative appointment will be made for 6 weeks from the date of surgery. If she needs a post-operative nurse visit for a voiding trial, that will be set up after she leaves the hospital.    Patient will call the clinic or use MyChart should anything change or any new issues arise.   Arma Lamp, MD

## 2024-01-15 ENCOUNTER — Encounter (HOSPITAL_COMMUNITY): Payer: Self-pay | Admitting: Obstetrics and Gynecology

## 2024-01-15 NOTE — Progress Notes (Signed)
 Spoke w/ via phone for pre-op interview--- [name] [name] needs dos----    BMP and EKG per anesthesia      [name] results------ COVID test -----patient states asymptomatic no test needed Arrive at -------1130 NPO after MN NO Solid Food.  Clear liquids from MN until---1030 Pre-Surgery Ensure or G2:  Med rec completed Medications to take morning of surgery ----- Levothyroxine , Celexa  and Metoprolol . Diabetic medication -----  GLP1 agonist last dose: GLP1 instructions:  Patient instructed no nail polish to be worn day of surgery Patient instructed to bring photo id and insurance card day of surgery Patient aware to have Driver (ride ) / caregiver    for 24 hours after surgery - [name] [name] Patient Special Instructions ----- Shower with antibacterial soap. Pre-Op special Instructions -----  Patient verbalized understanding of instructions that were given at this phone interview. Patient denies chest pain, sob, fever, cough at the interview.

## 2024-01-22 ENCOUNTER — Telehealth: Payer: Self-pay | Admitting: Obstetrics and Gynecology

## 2024-01-22 ENCOUNTER — Encounter (HOSPITAL_COMMUNITY): Payer: Self-pay | Admitting: Obstetrics and Gynecology

## 2024-01-22 ENCOUNTER — Ambulatory Visit (HOSPITAL_COMMUNITY)
Admission: RE | Admit: 2024-01-22 | Discharge: 2024-01-22 | Disposition: A | Discharge status: Home / Self Care | Payer: PRIVATE HEALTH INSURANCE | Attending: Obstetrics and Gynecology | Admitting: Obstetrics and Gynecology

## 2024-01-22 ENCOUNTER — Other Ambulatory Visit: Payer: Self-pay

## 2024-01-22 ENCOUNTER — Ambulatory Visit (HOSPITAL_BASED_OUTPATIENT_CLINIC_OR_DEPARTMENT_OTHER): Admitting: Anesthesiology

## 2024-01-22 ENCOUNTER — Ambulatory Visit (HOSPITAL_COMMUNITY): Admitting: Anesthesiology

## 2024-01-22 ENCOUNTER — Encounter (HOSPITAL_COMMUNITY)
Admission: RE | Disposition: A | Discharge status: Home / Self Care | Payer: Self-pay | Source: Home / Self Care | Attending: Obstetrics and Gynecology

## 2024-01-22 DIAGNOSIS — Z79899 Other long term (current) drug therapy: Secondary | ICD-10-CM | POA: Insufficient documentation

## 2024-01-22 DIAGNOSIS — N8110 Cystocele, unspecified: Secondary | ICD-10-CM

## 2024-01-22 DIAGNOSIS — F418 Other specified anxiety disorders: Secondary | ICD-10-CM | POA: Diagnosis not present

## 2024-01-22 DIAGNOSIS — F32A Depression, unspecified: Secondary | ICD-10-CM | POA: Diagnosis not present

## 2024-01-22 DIAGNOSIS — E039 Hypothyroidism, unspecified: Secondary | ICD-10-CM | POA: Insufficient documentation

## 2024-01-22 DIAGNOSIS — N816 Rectocele: Secondary | ICD-10-CM | POA: Insufficient documentation

## 2024-01-22 DIAGNOSIS — N8112 Cystocele, lateral: Secondary | ICD-10-CM | POA: Insufficient documentation

## 2024-01-22 DIAGNOSIS — E785 Hyperlipidemia, unspecified: Secondary | ICD-10-CM | POA: Diagnosis not present

## 2024-01-22 DIAGNOSIS — I10 Essential (primary) hypertension: Secondary | ICD-10-CM | POA: Insufficient documentation

## 2024-01-22 DIAGNOSIS — K219 Gastro-esophageal reflux disease without esophagitis: Secondary | ICD-10-CM | POA: Diagnosis not present

## 2024-01-22 DIAGNOSIS — F419 Anxiety disorder, unspecified: Secondary | ICD-10-CM | POA: Insufficient documentation

## 2024-01-22 DIAGNOSIS — R339 Retention of urine, unspecified: Secondary | ICD-10-CM

## 2024-01-22 DIAGNOSIS — N814 Uterovaginal prolapse, unspecified: Secondary | ICD-10-CM | POA: Diagnosis not present

## 2024-01-22 HISTORY — PX: CYSTOSCOPY: SHX5120

## 2024-01-22 HISTORY — DX: Anxiety disorder, unspecified: F41.9

## 2024-01-22 HISTORY — PX: ANTERIOR AND POSTERIOR REPAIR: SHX5121

## 2024-01-22 LAB — BASIC METABOLIC PANEL WITH GFR
Anion gap: 8 (ref 5–15)
BUN: 10 mg/dL (ref 8–23)
CO2: 25 mmol/L (ref 22–32)
Calcium: 9.4 mg/dL (ref 8.9–10.3)
Chloride: 100 mmol/L (ref 98–111)
Creatinine, Ser: 0.59 mg/dL (ref 0.44–1.00)
GFR, Estimated: >60 mL/min (ref >60)
Glucose, Bld: 106 mg/dL — ABNORMAL HIGH (ref 70–99)
Potassium: 3.8 mmol/L (ref 3.5–5.1)
Sodium: 133 mmol/L — ABNORMAL LOW (ref 135–145)

## 2024-01-22 SURGERY — ANTERIOR (CYSTOCELE) AND POSTERIOR REPAIR (RECTOCELE)
Anesthesia: General

## 2024-01-22 MED ORDER — EPHEDRINE SULFATE-NACL 50-0.9 MG/10ML-% IV SOSY
PREFILLED_SYRINGE | INTRAVENOUS | Status: DC | PRN
  Administered 2024-01-22: 5 mg via INTRAVENOUS

## 2024-01-22 MED ORDER — CEFAZOLIN SODIUM-DEXTROSE 2-4 GM/100ML-% IV SOLN
2.0000 g | INTRAVENOUS | Status: AC
  Administered 2024-01-22: 2 g via INTRAVENOUS

## 2024-01-22 MED ORDER — ENOXAPARIN SODIUM 40 MG/0.4ML IJ SOSY
PREFILLED_SYRINGE | INTRAMUSCULAR | Status: DC
Start: 2024-01-22 — End: 2024-01-22
  Filled 2024-01-22: qty 0.4

## 2024-01-22 MED ORDER — CHLORHEXIDINE GLUCONATE 0.12 % MT SOLN
OROMUCOSAL | Status: DC
Start: 2024-01-22 — End: 2024-01-22
  Filled 2024-01-22: qty 15

## 2024-01-22 MED ORDER — ONDANSETRON HCL 4 MG/2ML IJ SOLN: 4.0000 mg | Freq: Once | INTRAMUSCULAR | Status: DC | PRN

## 2024-01-22 MED ORDER — DEXAMETHASONE SODIUM PHOSPHATE 10 MG/ML IJ SOLN
INTRAMUSCULAR | Status: DC | PRN
  Administered 2024-01-22: 10 mg via INTRAVENOUS

## 2024-01-22 MED ORDER — ENOXAPARIN SODIUM 40 MG/0.4ML IJ SOSY
40.0000 mg | PREFILLED_SYRINGE | INTRAMUSCULAR | Status: AC
  Administered 2024-01-22: 40 mg via SUBCUTANEOUS

## 2024-01-22 MED ORDER — ONDANSETRON HCL 4 MG/2ML IJ SOLN
INTRAMUSCULAR | Status: DC | PRN
  Administered 2024-01-22: 4 mg via INTRAVENOUS

## 2024-01-22 MED ORDER — CEFAZOLIN SODIUM-DEXTROSE 2-4 GM/100ML-% IV SOLN
INTRAVENOUS | Status: AC
  Filled 2024-01-22: qty 100

## 2024-01-22 MED ORDER — PROPOFOL 10 MG/ML IV BOLUS
INTRAVENOUS | Status: AC
  Filled 2024-01-22: qty 20

## 2024-01-22 MED ORDER — ORAL CARE MOUTH RINSE: 15.0000 mL | Freq: Once | OROMUCOSAL | Status: AC

## 2024-01-22 MED ORDER — PROPOFOL 10 MG/ML IV BOLUS
INTRAVENOUS | Status: DC | PRN
  Administered 2024-01-22: 140 mg via INTRAVENOUS

## 2024-01-22 MED ORDER — 0.9 % SODIUM CHLORIDE (POUR BTL) OPTIME
TOPICAL | Status: DC | PRN
  Administered 2024-01-22: 1000 mL

## 2024-01-22 MED ORDER — FENTANYL CITRATE (PF) 100 MCG/2ML IJ SOLN: 25.0000 ug | INTRAMUSCULAR | Status: DC | PRN

## 2024-01-22 MED ORDER — AMISULPRIDE (ANTIEMETIC) 5 MG/2ML IV SOLN: 10.0000 mg | Freq: Once | INTRAVENOUS | Status: DC | PRN

## 2024-01-22 MED ORDER — CHLORHEXIDINE GLUCONATE 0.12 % MT SOLN
15.0000 mL | Freq: Once | OROMUCOSAL | Status: AC
  Administered 2024-01-22: 15 mL via OROMUCOSAL

## 2024-01-22 MED ORDER — LACTATED RINGERS IV SOLN
INTRAVENOUS | Status: DC | PRN
Start: 2024-01-22 — End: 2024-01-22

## 2024-01-22 MED ORDER — FENTANYL CITRATE (PF) 250 MCG/5ML IJ SOLN
INTRAMUSCULAR | Status: AC
  Filled 2024-01-22: qty 5

## 2024-01-22 MED ORDER — ACETAMINOPHEN 500 MG PO TABS
1000.0000 mg | ORAL_TABLET | ORAL | Status: AC
  Administered 2024-01-22: 1000 mg via ORAL

## 2024-01-22 MED ORDER — PHENAZOPYRIDINE HCL 100 MG PO TABS
200.0000 mg | ORAL_TABLET | ORAL | Status: AC
  Administered 2024-01-22: 200 mg via ORAL

## 2024-01-22 MED ORDER — LIDOCAINE-EPINEPHRINE 1 %-1:100000 IJ SOLN
INTRAMUSCULAR | Status: AC
  Filled 2024-01-22: qty 2

## 2024-01-22 MED ORDER — EPHEDRINE 5 MG/ML INJ
INTRAVENOUS | Status: AC
  Filled 2024-01-22: qty 5

## 2024-01-22 MED ORDER — SODIUM CHLORIDE 0.9 % IR SOLN
Status: DC | PRN
  Administered 2024-01-22: 1000 mL via INTRAVESICAL

## 2024-01-22 MED ORDER — LIDOCAINE-EPINEPHRINE 1 %-1:100000 IJ SOLN
INTRAMUSCULAR | Status: DC | PRN
  Administered 2024-01-22: 20 mL

## 2024-01-22 MED ORDER — FENTANYL CITRATE (PF) 250 MCG/5ML IJ SOLN
INTRAMUSCULAR | Status: DC | PRN
  Administered 2024-01-22: 50 ug via INTRAVENOUS

## 2024-01-22 MED ORDER — LACTATED RINGERS IV SOLN: INTRAVENOUS | Status: DC

## 2024-01-22 MED ORDER — ACETAMINOPHEN 500 MG PO TABS
ORAL_TABLET | ORAL | Status: AC
  Filled 2024-01-22: qty 2

## 2024-01-22 MED ORDER — PHENAZOPYRIDINE HCL 100 MG PO TABS
ORAL_TABLET | ORAL | Status: AC
  Filled 2024-01-22: qty 2

## 2024-01-22 MED ORDER — LIDOCAINE 2% (20 MG/ML) 5 ML SYRINGE
INTRAMUSCULAR | Status: DC | PRN
  Administered 2024-01-22: 80 mg via INTRAVENOUS

## 2024-01-22 SURGICAL SUPPLY — 27 items
BLADE SURG 15 STRL LF DISP TIS (BLADE) ×1 IMPLANT
COVER MAYO STAND STRL (DRAPES) ×1 IMPLANT
ELECTRODE REM PT RTRN 9FT ADLT (ELECTROSURGICAL) IMPLANT
GLOVE BIOGEL PI IND STRL 6.5 (GLOVE) ×1 IMPLANT
GLOVE ECLIPSE 6.0 STRL STRAW (GLOVE) ×1 IMPLANT
GOWN STRL REUS W/ TWL LRG LVL3 (GOWN DISPOSABLE) ×1 IMPLANT
GOWN STRL REUS W/TWL LRG LVL3 (GOWN DISPOSABLE) ×1 IMPLANT
HIBICLENS CHG 4% 4OZ BTL (MISCELLANEOUS) ×1 IMPLANT
HOLDER FOLEY CATH W/STRAP (MISCELLANEOUS) ×1 IMPLANT
KIT TURNOVER KIT B (KITS) ×1 IMPLANT
MANIFOLD NEPTUNE II (INSTRUMENTS) ×1 IMPLANT
NDL HYPO 22X1.5 SAFETY MO (MISCELLANEOUS) ×1 IMPLANT
NEEDLE HYPO 22X1.5 SAFETY MO (MISCELLANEOUS) ×1 IMPLANT
NS IRRIG 1000ML POUR BTL (IV SOLUTION) ×1 IMPLANT
PACK CYSTO (CUSTOM PROCEDURE TRAY) ×1 IMPLANT
PACK VAGINAL WOMENS (CUSTOM PROCEDURE TRAY) ×1 IMPLANT
PAD OB MATERNITY 11 LF (PERSONAL CARE ITEMS) ×1 IMPLANT
RETRACTOR LONE STAR DISPOSABLE (INSTRUMENTS) IMPLANT
RETRACTOR STAY HOOK 5MM (MISCELLANEOUS) ×1 IMPLANT
SET CYSTO W/LG BORE CLAMP LF (SET/KITS/TRAYS/PACK) ×1 IMPLANT
SLEEVE SCD COMPRESS KNEE MED (STOCKING) ×1 IMPLANT
SPIKE FLUID TRANSFER (MISCELLANEOUS) IMPLANT
SURGIFLO W/THROMBIN 8M KIT (HEMOSTASIS) IMPLANT
SUT VIC AB 0 CT1 27XBRD ANTBC (SUTURE) ×1 IMPLANT
SUT VICRYL 2-0 SH 8X27 (SUTURE) ×1 IMPLANT
TOWEL GREEN STERILE FF (TOWEL DISPOSABLE) ×1 IMPLANT
TRAY FOLEY W/BAG SLVR 14FR LF (SET/KITS/TRAYS/PACK) ×1 IMPLANT

## 2024-01-22 NOTE — Telephone Encounter (Signed)
 [name] [name] underwent anterior and posterior repair with cystoscopy on 01/22/24.   Pt was sent home with a catheter due to history of incomplete bladder emptying.  Please call her for a routine post op check and to schedule a voiding trial by Thurs or Fri 5/22- 5/23. Thanks!  Arma Lamp, MD

## 2024-01-22 NOTE — Anesthesia Preprocedure Evaluation (Addendum)
 Anesthesia Evaluation  Patient identified by MRN, date of birth, ID band Patient awake    Reviewed: Allergy & Precautions, NPO status , Patient's Chart, lab work & pertinent test results, reviewed documented beta blocker date and time   History of Anesthesia Complications (+) PONV and history of anesthetic complications  Airway Mallampati: III  TM Distance: <3 FB Neck ROM: Full    Dental  (+) Dental Advisory Given, Partial Upper, Missing,    Pulmonary neg pulmonary ROS   Pulmonary exam normal breath sounds clear to auscultation       Cardiovascular hypertension, Pt. on home beta blockers and Pt. on medications Normal cardiovascular exam Rhythm:Regular Rate:Normal     Neuro/Psych  PSYCHIATRIC DISORDERS Anxiety Depression    negative neurological ROS     GI/Hepatic Neg liver ROS,GERD  ,,  Endo/Other  Hypothyroidism    Renal/GU negative Renal ROS   anterior vaginal prolapse; posterior vaginal prolapse    Musculoskeletal PMR   Abdominal   Peds  Hematology negative hematology ROS (+)   Anesthesia Other Findings Day of surgery medications reviewed with the patient.  Reproductive/Obstetrics                              Anesthesia Physical Anesthesia Plan  ASA: 3  Anesthesia Plan: General   Post-op Pain Management: Tylenol  PO (pre-op)*   Induction: Intravenous  PONV Risk Score and Plan: 4 or greater and Dexamethasone  and Ondansetron   Airway Management Planned: LMA  Additional Equipment:   Intra-op Plan:   Post-operative Plan: Extubation in OR  Informed Consent: I have reviewed the patients History and Physical, chart, labs and discussed the procedure including the risks, benefits and alternatives for the proposed anesthesia with the patient or authorized representative who has indicated his/her understanding and acceptance.     Dental advisory given  Plan Discussed with:  CRNA  Anesthesia Plan Comments:          Anesthesia Quick Evaluation

## 2024-01-22 NOTE — Anesthesia Postprocedure Evaluation (Signed)
 Anesthesia Post Note  Patient: [name] [name]  Procedure(s) Performed: ANTERIOR (CYSTOCELE) AND POSTERIOR REPAIR (RECTOCELE) CYSTOSCOPY     Patient location during evaluation: PACU Anesthesia Type: General Level of consciousness: awake and alert Pain management: pain level controlled Vital Signs Assessment: post-procedure vital signs reviewed and stable Respiratory status: spontaneous breathing, nonlabored ventilation and respiratory function stable Cardiovascular status: blood pressure returned to baseline and stable Postop Assessment: no apparent nausea or vomiting Anesthetic complications: no   No notable events documented.  Last Vitals:  Vitals:   01/22/24 1430 01/22/24 1445  BP: (!) 147/60 (!) 144/60  Pulse: (!) 58 63  Resp: 16 16  Temp:  36.5 C  SpO2: 98% 96%    Last Pain:  Vitals:   01/22/24 1445  TempSrc:   PainSc: 0-No pain                 Erin Havers

## 2024-01-22 NOTE — Progress Notes (Signed)
 Pt d/c home with foley in place. Discharge instructions were reviewed with pt and pt friend. Return demonstration was done on how to empty and care for the foley catheter. Both pt and pt friend denied any questions regarding discharge teaching.

## 2024-01-22 NOTE — Transfer of Care (Signed)
 Immediate Anesthesia Transfer of Care Note  Patient: [name] [name]  Procedure(s) Performed: ANTERIOR (CYSTOCELE) AND POSTERIOR REPAIR (RECTOCELE) CYSTOSCOPY  Patient Location: PACU  Anesthesia Type:General  Level of Consciousness: awake and patient cooperative  Airway & Oxygen Therapy: Patient Spontanous Breathing and Patient connected to face mask  Post-op Assessment: Report given to RN and Post -op Vital signs reviewed and stable  Post vital signs: Reviewed and stable  Last Vitals:  Vitals Value Taken Time  BP 158/59 01/22/24 1400  Temp    Pulse 68 01/22/24 1402  Resp 26 01/22/24 1402  SpO2 99 % 01/22/24 1402  Vitals shown include unfiled device data.  Last Pain:  Vitals:   01/22/24 1138  TempSrc: Oral  PainSc: 0-No pain      Patients Stated Pain Goal: 5 (01/22/24 1138)  Complications: No notable events documented.

## 2024-01-22 NOTE — Interval H&P Note (Signed)
 History and Physical Interval Note:  01/22/2024 12:14 PM  [name] [name]  has presented today for surgery, with the diagnosis of anterior vaginal prolapse; posterior vaginal prolapse.  The various methods of treatment have been discussed with the patient and family. After consideration of risks, benefits and other options for treatment, the patient has consented to  Procedure(s) with comments: ANTERIOR (CYSTOCELE) AND POSTERIOR REPAIR (RECTOCELE) (N/A)  CYSTOSCOPY (N/A) as a surgical intervention.  The patient's history has been reviewed, patient examined, no change in status, stable for surgery.  I have reviewed the patient's chart and labs.  Questions were answered to the patient's satisfaction.     Arma Lamp

## 2024-01-22 NOTE — Op Note (Signed)
 Operative Note  Preoperative Diagnosis: anterior vaginal prolapse and posterior vaginal prolapse  Postoperative Diagnosis: same  Procedures performed:  Anterior and posterior repair, cystoscopy  Implants: none  Attending Surgeon: Ollen Beverage, MD  Anesthesia: General LMA  Findings: 1. On vaginal exam, stage II prolapse present  2. On cystoscopy, normal bladder and urethral mucosa without injury or lesion. Brisk bilateral ureteral efflux present.    Specimens: none  Estimated blood loss: 25 mL  IV fluids: 300 mL  Urine output: 300 mL  Complications: none  Procedure in Detail:  After informed consent was obtained, the patient was taken to the operating room where general anesthesia was induced. She was placed in dorsal lithotomy position, taking care to avoid any traction of the extremities and prepped and draped in the usual sterile fashion. A self-retaining lonestar retractor was placed using four elastic blue stays.  After a foley catheter was inserted into the urethra, the location of the midurethra was palpated. Two Allis clamps were along the anterior vaginal wall defect. 1% lidocaine  with epinephrine  was injected into the vaginal mucosa.  A vertical incision was made between these two Allis clamps with a 15 blade scalpel.  Allis clamps were placed along this incision and Metzenbaum scissors were used to undermine the vaginal mucosa along the incision.  The vaginal mucosa was then sharply dissected off to the vesicovaginal septum bilaterally to the level of the pubic rami.  Anterior plication of the vesicovaginal septum was then performed using plicating sutures of 2-0 Vicryl. The vaginal mucosal edges were trimmed and the incision reapproximated with 2-0 Vicryl in a running fashion.    The Foley catheter was removed.  A 70-degree cystoscope was introduced, and 360-degree inspection revealed no injury, lesion or foreign body in the bladder.  Good bilateral ureteral efflux  was noted.  The bladder was drained and the cystoscope was removed.  The Foley catheter was reinserted.  Attention was then turned to the posterior vagina.  Two Allis clamps were placed in the midline of the posterior vaginal wall defect.  1% lidocaine  with epinephrine  was injected into the vaginal mucosa. A vertical incision was made between these clamps with a 15 blade scalpel.  The rectovaginal septum was then dissected off the vaginal mucosa bilaterally.  No enterocele was noted.  The rectovaginal septum was then reapproximated with plicating sutures of 2-0 Vicryl.  The last distal stitch incorporated the perineal body in a U stitch fashion.  After plication, the excess vaginal mucosa was trimmed and the vaginal mucosa was reapproximated using 2-0 Vicryl sutures in a running fashion.  The vagina was copiously irrigated and hemostasis was noted.  Vaginal packing was not placed.  A rectal examination was normal and confirmed no sutures within the rectum.  The patient tolerated the procedure well.  She was awakened from anesthesia and transferred to the recovery room in stable condition. All counts were correct x 2.    Arma Lamp, MD
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
## 2024-01-22 NOTE — Anesthesia Procedure Notes (Signed)
 Procedure Name: LMA Insertion Date/Time: 01/22/2024 12:42 PM  Performed by: Loreda Rodriguez, CRNAPre-anesthesia Checklist: Patient identified, Emergency Drugs available, Suction available and Patient being monitored Patient Re-evaluated:Patient Re-evaluated prior to induction Oxygen Delivery Method: Circle System Utilized Preoxygenation: Pre-oxygenation with 100% oxygen Induction Type: IV induction Ventilation: Mask ventilation without difficulty LMA: LMA inserted LMA Size: 4.0 Tube size: 4.0 mm Number of attempts: 1 Airway Equipment and Method: Bite block Placement Confirmation: positive ETCO2 Tube secured with: Tape Dental Injury: Teeth and Oropharynx as per pre-operative assessment  Comments: atraumatic

## 2024-01-22 NOTE — Discharge Instructions (Addendum)
 No acetaminophen /Tylenol  until after 5:45 pm today if needed.       POST OPERATIVE INSTRUCTIONS  General Instructions Recovery (not bed rest) will last approximately 6 weeks Walking is encouraged, but refrain from strenuous exercise/ housework/ heavy lifting. No lifting >10lbs  Nothing in the vagina- NO intercourse, tampons or douching Bathing:  Do not submerge in water (NO swimming, bath, hot tub, etc) until after your postop visit. You can shower starting the day after surgery.  No driving until you are not taking narcotic pain medicine and until your pain is well enough controlled that you can slam on the breaks or make sudden movements if needed.   Taking your medications Please take your acetaminophen  and ibuprofen  on a schedule for the first 48 hours. Take 600mg  ibuprofen , then take 500mg  acetaminophen  3 hours later, then continue to alternate ibuprofen  and acetaminophen . That way you are taking each type of medication every 6 hours. Take the prescribed narcotic (oxycodone , tramadol, etc) as needed, with a maximum being every 4 hours.  Take a stool softener daily to keep your stools soft and preventing you from straining. If you have diarrhea, you decrease your stool softener. This is explained more below. We have prescribed you Miralax .  Reasons to Call the Nurse (see last page for phone numbers) Heavy Bleeding (changing your pad every 1-2 hours) Persistent nausea/vomiting Fever (100.4 degrees or more) Incision problems (pus or other fluid coming out, redness, warmth, increased pain)  Things to Expect After Surgery Mild to Moderate pain is normal during the first day or two after surgery. If prescribed, take Ibuprofen  or Tylenol  first and use the stronger medicine for "break-through" pain. You can overlap these medicines because they work differently.   Constipation   To Prevent Constipation:  Eat a well-balanced diet including protein, grains, fresh fruit and vegetables.   Drink plenty of fluids. Walk regularly.  Depending on specific instructions from your physician: take Miralax  daily and additionally you can add a stool softener (colace/ docusate) and fiber supplement. Continue as long as you're on pain medications.   To Treat Constipation:  If you do not have a bowel movement in 2 days after surgery, you can take 2 Tbs of Milk of Magnesia 1-2 times a day until you have a bowel movement. If diarrhea occurs, decrease the amount or stop the laxative. If no results with Milk of Magnesia, you can drink a bottle of magnesium  citrate which you can purchase over the counter.  Fatigue:  This is a normal response to surgery and will improve with time.  Plan frequent rest periods throughout the day.  Gas Pain:  This is very common but can also be very painful! Drink warm liquids such as herbal teas, bouillon or soup. Walking will help you pass more gas.  Mylicon or Gas-X can be taken over the counter.  Leaking Urine:  Varying amounts of leakage may occur after surgery.  This should improve with time. Your bladder needs at least 3 months to recover from surgery. If you leak after surgery, be sure to mention this to your doctor at your post-op visit. If you were taking medications for overactive bladder prior to surgery, be sure to restart the medications immediately after surgery.  Incisions: If you have incisions on your abdomen, the skin glue will dissolve on its own over time. It is ok to gently rinse with soap and water over these incisions but do not scrub.  Catheter Approximately 50% of patients are unable to urinate  after surgery and need to go home with a catheter. This allows your bladder to rest so it can return to full function. If you go home with a catheter, the office will call to set up a voiding trial a few days after surgery. For most patients, by this visit, they are able to urinate on their own. Long term catheter use is rare.   Return to Work  As work  demands and recovery times vary widely, it is hard to predict when you will want to return to work. If you have a desk job with no strenuous physical activity, and if you would like to return sooner than generally recommended, discuss this with your provider or call our office.   Post op concerns  For non-emergent issues, please call the Urogynecology Nurse. Please leave a message and someone will contact you within one business day.  You can also send a message through MyChart.   AFTER HOURS (After 5:00 PM and on weekends):  For urgent matters that cannot wait until the next business day. Call our office [phone] and connect to the doctor on call.  Please reserve this for important issues.   **FOR ANY TRUE EMERGENCY ISSUES CALL 911 OR GO TO THE NEAREST EMERGENCY ROOM.** Please inform our office or the doctor on call of any emergency.     APPOINTMENTS: Call [phone]       Post Anesthesia Home Care Instructions  Activity: Get plenty of rest for the remainder of the day. A responsible individual must stay with you for 24 hours following the procedure.  For the next 24 hours, DO NOT: -Drive a car -Advertising copywriter -Drink alcoholic beverages -Take any medication unless instructed by your physician -Make any legal decisions or sign important papers.  Meals: Start with liquid foods such as gelatin or soup. Progress to regular foods as tolerated. Avoid greasy, spicy, heavy foods. If nausea and/or vomiting occur, drink only clear liquids until the nausea and/or vomiting subsides. Call your physician if vomiting continues.  Special Instructions/Symptoms: Your throat may feel dry or sore from the anesthesia or the breathing tube placed in your throat during surgery. If this causes discomfort, gargle with warm salt water. The discomfort should disappear within 24 hours.

## 2024-01-23 ENCOUNTER — Telehealth: Payer: Self-pay

## 2024-01-23 ENCOUNTER — Encounter (HOSPITAL_COMMUNITY): Payer: Self-pay | Admitting: Obstetrics and Gynecology

## 2024-01-23 NOTE — Telephone Encounter (Signed)
 [name] [name]  underwent Anterior (cystocele) And Posterior Repair (rectocele) and Cystoscopy  on 01/22/2024  with [x] Dr Frutoso Jing [] Dr Aron Lard.  The patient reports that her pain is controlled.  She is taking [] No Medication [x] Acetaminophen  500mg  every 6 hours [x] Ibuprofen  600mg  every 6 hours or []  Prescribed Narcotic.  Her pain level is 2[] with medication.  She reports vaginal bleeding, spotting.  The patient is tolerating PO fluids and solids. She has not had a bowel movement and is taking Miralax  for a bowel regimen. She is not not passing gas.  She was discharged with a catheter.   []  Discharged without a catheter, the patient does feel as if she is emptying her bladder.  [x] Discharged with a catheter, the patient is having concerns with her catheter. She realized she had plugged it in error with the stopper. She will return for a voiding trial. [x] Verified scheduled date and time with patient. 01-25-2024 at 10am. She does not have any additional questions.  Reviewed Post operative instructions as needed to answer additional questions.   CC'd note to patient's provider.

## 2024-01-24 ENCOUNTER — Other Ambulatory Visit: Payer: Self-pay | Admitting: Family Medicine

## 2024-01-24 DIAGNOSIS — I10 Essential (primary) hypertension: Secondary | ICD-10-CM

## 2024-01-25 ENCOUNTER — Ambulatory Visit

## 2024-01-25 NOTE — Telephone Encounter (Signed)
 Pateint was seen 01-25-2024. She failed her voiding trial, she returns on 01-31-2024

## 2024-01-25 NOTE — Patient Instructions (Signed)
 Please keep all future appointments and if you have any questions or concerns please feel free to contact our office at [phone].

## 2024-01-25 NOTE — Progress Notes (Signed)
 [name] [name] is a 89 y.o. female here for a voiding trial.  14 fr cath removed with no complications.  Instilled: 250 ml Voided: 150 ml PVR: 240 ml  Patient failed the void and trial. 14 fr cath placed and scheduled to redo Void and trial next week Wednesday.

## 2024-01-31 ENCOUNTER — Ambulatory Visit

## 2024-01-31 VITALS — BP 113/69 | HR 64

## 2024-01-31 DIAGNOSIS — R339 Retention of urine, unspecified: Secondary | ICD-10-CM

## 2024-01-31 NOTE — Progress Notes (Signed)
 [name] [name] underwent Anterior (cystocele) And Posterior Repair (rectocele) and Cystoscopy on 01/22/2024  She presents for a voiding trial.   Patient was identified with 2 identifiers.  The patient states she does not have any concerns with the foley placed.  300 mL of NS was instilled into the bladder via a catheter.  The catheter was removed and patient was instructed to void into the urinary hat.  She voided 80 mL.  The post void residual measured by bladder scan was 99 mL. She did pass the voiding trial.  The patient was not sent home with a catheter.   We had her come back at 4pm for a follow up PVR. PVR was 72 voided 20. Per Dr Frutoso Jing ok to go home with no replacement catheter.   The patient received aftercare instructions and will follow up as scheduled.

## 2024-02-22 ENCOUNTER — Other Ambulatory Visit: Payer: PRIVATE HEALTH INSURANCE

## 2024-02-22 DIAGNOSIS — E890 Postprocedural hypothyroidism: Secondary | ICD-10-CM | POA: Diagnosis not present

## 2024-02-23 LAB — TSH: TSH: 0.16 m[IU]/L — ABNORMAL LOW (ref 0.40–4.50)

## 2024-02-23 LAB — T4, FREE: Free T4: 1.4 ng/dL (ref 0.8–1.8)

## 2024-02-26 ENCOUNTER — Other Ambulatory Visit: Payer: Self-pay

## 2024-02-28 ENCOUNTER — Encounter: Payer: Self-pay | Admitting: Internal Medicine

## 2024-02-28 ENCOUNTER — Ambulatory Visit (INDEPENDENT_AMBULATORY_CARE_PROVIDER_SITE_OTHER): Payer: PRIVATE HEALTH INSURANCE | Admitting: Internal Medicine

## 2024-02-28 VITALS — BP 110/74 | HR 97 | Ht 63.0 in | Wt 140.0 lb

## 2024-02-28 DIAGNOSIS — E890 Postprocedural hypothyroidism: Secondary | ICD-10-CM | POA: Diagnosis not present

## 2024-02-28 MED ORDER — LEVOTHYROXINE SODIUM 88 MCG PO TABS: 88.0000 ug | ORAL_TABLET | Freq: Every day | ORAL | 3 refills | Status: DC

## 2024-02-28 NOTE — Patient Instructions (Signed)

## 2024-02-28 NOTE — Progress Notes (Signed)
 Name: [name] [name]  MRN/ DOB: [id number], 03/29/1935    Age/ Sex: 89 y.o., female    PCP: [name] [name] DELENA, MD   Reason for Endocrinology Evaluation: Hypothyroidism     Date of Initial Endocrinology Evaluation: 07/22/2022    HPI: [name] is a 89 y.o. female with a past medical history of Htn, Dyslipidemia and RA. The patient presented for initial endocrinology clinic visit on 07/22/2022 for consultative assistance with her Hypothyroidism.   She is S/P total thyroidectomy 08/1987 due to Follicular thyroid  carcinoma. She is S/P RAI ablation 1989    Her last thyroid  ultrasound was in 2014 with NO evidence of recurrence   TG and TG antibodies were  negative -07/2022  SUBJECTIVE:    Today (02/28/24):  [name] [name] is here for follow-up on post operative hypothyroid  She continues to follow-up with orthopedics for knee osteoarthritis She is scheduled for colporrhaphy for cystocele repair 01/2024  She has been noted with weight loss She ran out levothyroxine  for 4 days  No local neck swelling  Has noted palpitations  Insomnia has improved  She has IBS with stable bowel movement   She has anterior and posterior repair , cystoscopy 01/2024 due to prolapse   Continues with insomnia at night for the past 2 years, she attributed this to her thyroid    Levothyroxine  112 mcg , 1 tablet Monday through Saturday and none on Sundays   HISTORY:  Past Medical History:  Past Medical History:  Diagnosis Date   Allergic angioedema    from shellfish and tree nuts   Anxiety    Chest pain 2000 and 2001   negative cardiac workup   Complication of anesthesia    Diverticulosis    Fibrocystic breast disease    Hyperlipidemia    Hypertension    Hypothyroidism    Irritable bowel syndrome    Perirectal cyst    ?lymphocoele   PMR (polymyalgia rheumatica) (HCC) 2011   PONV (postoperative nausea and vomiting)    Seborrheic dermatitis    Squamous cell skin cancer     X1, [facility]   Thyroid  disease    post op hypothyroidism; TSH goal would be sup;pressive   Past Surgical History:  Past Surgical History:  Procedure Laterality Date   ANTERIOR AND POSTERIOR REPAIR N/A 01/22/2024   Procedure: ANTERIOR (CYSTOCELE) AND POSTERIOR REPAIR (RECTOCELE);  Surgeon: [name] Rosaline SAILOR, MD;  Location: [facility] OR;  Service: Gynecology;  Laterality: N/A;  Total time needed is 1 hour   BILIARY DILATION  04/23/2021   Procedure: BILIARY DILATION;  Surgeon: [name] Groom, MD;  Location: [facility];  Service: Endoscopy;;   CHOLECYSTECTOMY N/A 04/24/2021   Procedure: LAPAROSCOPIC CHOLECYSTECTOMY;  Surgeon: [name] Lonni HERO, MD;  Location: [facility] ORS;  Service: General;  Laterality: N/A;  90   COLONOSCOPY  2004   per Dr. Obie, clear but incomplete; Barium study added, repeat in 10 yrs    CYSTOSCOPY N/A 01/22/2024   Procedure: CYSTOSCOPY;  Surgeon: [name] Rosaline SAILOR, MD;  [facility];  Service: Gynecology;  Laterality: N/A;   DILATION AND CURETTAGE OF UTERUS     ERCP N/A 04/23/2021   Procedure: ENDOSCOPIC RETROGRADE CHOLANGIOPANCREATOGRAPHY (ERCP);  Surgeon: [name] Groom, MD;  Location: [facility];  Service: Endoscopy;  Laterality: N/A;   ESOPHAGOGASTRODUODENOSCOPY (EGD) WITH PROPOFOL  N/A 07/21/2018   Procedure: ESOPHAGOGASTRODUODENOSCOPY (EGD) WITH PROPOFOL ;  Surgeon: [name] Groom, MD;  Location: [facility];  Service: Endoscopy;  Laterality: N/A;   ESOPHAGOGASTRODUODENOSCOPY (EGD) WITH  PROPOFOL  N/A 04/23/2021   Procedure: ESOPHAGOGASTRODUODENOSCOPY (EGD) WITH PROPOFOL ;  Surgeon: [name] Groom, MD;  Location: [facility];  Service: Endoscopy;  Laterality: N/A;   exporatory surgery for rectal mass  1986   peri rectal cyst   FOREIGN BODY REMOVAL  07/21/2018   Procedure: FOREIGN BODY REMOVAL;  Surgeon: [name] Groom, MD;  Location: [facility];  Service: Endoscopy;;   REMOVAL OF STONES  04/23/2021   Procedure: REMOVAL OF STONES;  Surgeon: [name] Groom, MD;  Location:  [facility];  Service: Endoscopy;;   SPHINCTEROTOMY  04/23/2021   Procedure: [name] [name];  Surgeon: [name] Groom, MD;  Location: [facility];  Service: Endoscopy;;   THYROIDECTOMY  1988   S/P radiation previously seen by [name] Corn, MD @ [facility], now followed by [name] [name]   TONSILLECTOMY  [phone]    Social History:  reports that she has never smoked. She has never used smokeless tobacco. She reports current alcohol use. She reports that she does not use drugs. Family History: family history includes Breast cancer in her maternal grandmother; Heart attack (age of onset: 24) in her paternal uncle; Heart attack (age of onset: 40) in her paternal grandfather; Hypertension in her maternal grandmother and mother; Parkinsonism in her brother; Prostate cancer in her father.   HOME MEDICATIONS: Allergies as of 02/28/2024       Reactions   Horse-derived Products    Redness & swelling of arm   Other Anaphylaxis   Tree nuts (not peanuts)  Other reaction(s): Other (See Comments) Uncoded Allergy. Allergen: Other Reaction: red and swelling   Penicillins    Rash 04/20/2021 tolerated Zosyn    Shellfish Allergy Other (See Comments)   Other Reaction: throat closes up   Shellfish-derived Products    Throat swelling   Bacitracin-polymyxin B    Local swelling   Egg-derived Products    REACTION: Unable to get Flu Vaccine Skin Tests+ for egg products   Neomycin-bacitracin Zn-polymyx         Medication List        Accurate as of February 28, 2024  2:32 PM. If you have any questions, ask your nurse or doctor.          acetaminophen  500 MG tablet Commonly known as: TYLENOL  Take 1 tablet (500 mg total) by mouth every 6 (six) hours as needed (pain).   CALCIUM 500 PO Take 1 tablet by mouth daily at 6 (six) AM.   citalopram  10 MG tablet Commonly known as: CELEXA  Take 1 tablet (10 mg total) by mouth daily.   conjugated estrogens  vaginal cream Commonly known as: PREMARIN  Place 0.5g  nightly in vagina for two weeks then twice a week after   EPINEPHrine  0.3 mg/0.3 mL Soaj injection Commonly known as: EPI-PEN Inject 0.3 mg into the muscle as needed for anaphylaxis.   ibuprofen  600 MG tablet Commonly known as: ADVIL  Take 1 tablet (600 mg total) by mouth every 6 (six) hours as needed.   levothyroxine  112 MCG tablet Commonly known as: SYNTHROID  Take 1 tablet (112 mcg total) by mouth as directed. 1 tablet Monday through Saturday and none on Sunday   metoprolol  tartrate 100 MG tablet Commonly known as: LOPRESSOR  TAKE 1 TABLET BY MOUTH EVERY MORNING, AND 1/2 TABLET EVERY EVENING   nitrofurantoin  25 MG capsule Commonly known as: MACRODANTIN  Take 25 mg by mouth daily at 8 pm. As needed   polyethylene glycol powder 17 GM/SCOOP powder Commonly known as: GLYCOLAX /MIRALAX  Take 17 g by mouth daily. Drink 17g (1 scoop) dissolved  in water per day.   pravastatin  40 MG tablet Commonly known as: PRAVACHOL  TAKE 1 TABLET(40 MG) BY MOUTH DAILY.   spironolactone  25 MG tablet Commonly known as: ALDACTONE  TAKE 1 TABLET(25 MG) BY MOUTH DAILY   VITAMIN D3 GUMMIES PO Take 1 tablet by mouth daily at 6 (six) AM.          REVIEW OF SYSTEMS: A comprehensive ROS was conducted with the patient and is negative except as per HPI   OBJECTIVE:  VS: BP 110/74 (BP Location: Left Arm, Patient Position: Sitting, Cuff Size: Normal)   Pulse 97   Ht 5' 3 (1.6 m)   Wt 140 lb (63.5 kg)   SpO2 (!) 67%   BMI 24.80 kg/m    Wt Readings from Last 3 Encounters:  02/28/24 140 lb (63.5 kg)  01/22/24 144 lb (65.3 kg)  01/03/24 144 lb 12.8 oz (65.7 kg)     EXAM: General: Pt appears well and is in NAD  Neck: General: Supple without adenopathy. Thyroid : No goiter or nodules appreciated.  Lungs: Clear with good BS bilat   Heart: Auscultation: RRR.  Extremities:  BL LE: No pretibial edema   Mental Status: Judgment, insight: Intact Orientation: Oriented to time, place, and person Mood  and affect: No depression, anxiety, or agitation     DATA REVIEWED:  Latest Reference Range & Units 02/22/24 13:52  TSH 0.40 - 4.50 mIU/L 0.16 (L)  T4,Free(Direct) 0.8 - 1.8 ng/dL 1.4  (L): Data is abnormally low    Latest Reference Range & Units 07/22/22 13:50  Thyroglobulin ng/mL 0.1 (L)  Thyroglobulin Ab < or = 1 IU/mL <1     ASSESSMENT/PLAN/RECOMMENDATIONS:   Postoperative Hypothyroidism:  - Clinically she is improving - TSH continues to be low, we will decrease levothyroxine  as below - She will have repeat TFTs in 6 weeks   Medications :  Stop levothyroxine  112 mcg , 1 tablet Monday through Saturday and none on Sundays Start levothyroxine  88 mcg daily    2. Hx of Follicular thyroid  carcinoma  - She is > 10 yrs  out from total thyroidectomy  - S/P RAI ablation in 1989 - Last ultrasound in 2014 showed no evidence of recurrence  - Tg , Tg Ab 's undetectable     F/U in 3 months    Signed electronically by: [name] [name] Butts, MD  [facility]  [facility] [address] [place] [place], [place] [address] Phone: [phone] FAX: [phone]   CC: [name] [name] LABOR, MD [address] [place] [address] Phone: [phone] Fax: [phone]   Return to Endocrinology clinic as below: Future Appointments  Date Time Provider Department Center  03/05/2024  2:00 PM [name] Rosaline SAILOR, MD [facility]  06/28/2024  1:00 PM [name] [name], [name] Redgie, MD [id number] None

## 2024-03-05 ENCOUNTER — Encounter: Payer: Self-pay | Admitting: Obstetrics and Gynecology

## 2024-03-05 ENCOUNTER — Ambulatory Visit (INDEPENDENT_AMBULATORY_CARE_PROVIDER_SITE_OTHER): Payer: PRIVATE HEALTH INSURANCE | Admitting: Obstetrics and Gynecology

## 2024-03-05 VITALS — BP 117/59 | HR 54

## 2024-03-05 DIAGNOSIS — Z48816 Encounter for surgical aftercare following surgery on the genitourinary system: Secondary | ICD-10-CM

## 2024-03-05 DIAGNOSIS — Z9889 Other specified postprocedural states: Secondary | ICD-10-CM

## 2024-03-05 NOTE — Patient Instructions (Signed)
 OK to return to normal activity.

## 2024-03-05 NOTE — Progress Notes (Signed)
 [facility]  Date of Visit: 03/05/2024  History of Present Illness: Ms. Karg is a 89 y.o. female scheduled today for a post-operative visit.   Surgery: s/p Anterior and posterior repair, cystoscopy on 01/22/24  She did not pass her postoperative void trial. Returned 5/22 to office and failed VT again. Replaced catheter and passed voiding trial on 5/28 (PVR )  Postoperative course has been uncomplicated.   Today she reports overall she empties her bladder well. Sometimes she has a hesitation but not straining to urinate. Never needed pain medication.   UTI in the last 6 weeks? No  Pain? No  She has returned to her normal activity (except for postop restrictions) Vaginal bulge? No  Stress incontinence: No  Urgency/frequency: No  Urge incontinence: No  Voiding dysfunction: No  Bowel issues: No - has been taking the miralax  as needed  Subjective Success: Do you usually have a bulge or something falling out that you can see or feel in the vaginal area? No  Retreatment Success: Any retreatment with surgery or pessary for any compartment? No    Medications: She has a current medication list which includes the following prescription(s): acetaminophen , calcium carbonate, cholecalciferol, citalopram , conjugated estrogens , epinephrine , ibuprofen , levothyroxine , metoprolol  tartrate, nitrofurantoin , polyethylene glycol powder, pravastatin , and spironolactone .   Allergies: Patient is allergic to horse-derived products, other, penicillins, shellfish allergy, shellfish-derived products, bacitracin-polymyxin b, egg-derived products, and neomycin-bacitracin zn-polymyx.   Physical Exam: BP (!) 117/59   Pulse (!) 54    Pelvic Examination: Vagina: Incisions healing well. Sutures are present at the anterior incision line and there is not granulation tissue. No tenderness along the anterior or posterior vagina. No apical tenderness. No pelvic masses.   POP-Q: POP-Q  -3                                             Aa   -3                                           Ba  7.5                                              C   2                                            Gh  4                                            Pb  8                                            tvl   -3  Ap  -3                                            Bp                                                 D    ---------------------------------------------------------  Assessment and Plan:  1. Post-operative state    - Well healed.  - Can resume regular activity including exercise and intercourse,  if desired.  - Discussed avoidance of heavy lifting and straining long term to reduce the risk of recurrence.   All questions answered.  Follow up as needed  Rosaline LOISE Caper, MD

## 2024-03-06 ENCOUNTER — Encounter: Payer: PRIVATE HEALTH INSURANCE | Admitting: Obstetrics and Gynecology

## 2024-03-20 DIAGNOSIS — M1711 Unilateral primary osteoarthritis, right knee: Secondary | ICD-10-CM | POA: Diagnosis not present

## 2024-03-20 DIAGNOSIS — M170 Bilateral primary osteoarthritis of knee: Secondary | ICD-10-CM | POA: Diagnosis not present

## 2024-03-20 DIAGNOSIS — M1712 Unilateral primary osteoarthritis, left knee: Secondary | ICD-10-CM | POA: Diagnosis not present

## 2024-03-27 ENCOUNTER — Other Ambulatory Visit: Payer: PRIVATE HEALTH INSURANCE

## 2024-03-27 DIAGNOSIS — E890 Postprocedural hypothyroidism: Secondary | ICD-10-CM | POA: Diagnosis not present

## 2024-03-27 LAB — T4, FREE: Free T4: 2 ng/dL — ABNORMAL HIGH (ref 0.8–1.8)

## 2024-03-27 LAB — TSH: TSH: 3.55 m[IU]/L (ref 0.40–4.50)

## 2024-03-28 ENCOUNTER — Ambulatory Visit: Payer: Self-pay | Admitting: Internal Medicine

## 2024-03-28 NOTE — Telephone Encounter (Signed)
 Please let the patient know that her TSH has normalized, her T4 is elevated, but it probably just needs a little bit longer to stabilize itself.   Please continue levothyroxine  88 mcg daily  I will do labs when I see her next visit  Thanks

## 2024-04-03 ENCOUNTER — Ambulatory Visit: Payer: PRIVATE HEALTH INSURANCE | Admitting: Internal Medicine

## 2024-04-22 DIAGNOSIS — M7062 Trochanteric bursitis, left hip: Secondary | ICD-10-CM | POA: Diagnosis not present

## 2024-04-25 ENCOUNTER — Other Ambulatory Visit: Payer: Self-pay | Admitting: Family Medicine

## 2024-04-25 DIAGNOSIS — I10 Essential (primary) hypertension: Secondary | ICD-10-CM

## 2024-04-29 ENCOUNTER — Other Ambulatory Visit: Payer: Self-pay | Admitting: Family Medicine

## 2024-05-10 DIAGNOSIS — M1712 Unilateral primary osteoarthritis, left knee: Secondary | ICD-10-CM | POA: Diagnosis not present

## 2024-05-17 DIAGNOSIS — M1712 Unilateral primary osteoarthritis, left knee: Secondary | ICD-10-CM | POA: Diagnosis not present

## 2024-05-29 DIAGNOSIS — M1711 Unilateral primary osteoarthritis, right knee: Secondary | ICD-10-CM | POA: Diagnosis not present

## 2024-06-05 DIAGNOSIS — M1711 Unilateral primary osteoarthritis, right knee: Secondary | ICD-10-CM | POA: Diagnosis not present

## 2024-06-28 ENCOUNTER — Ambulatory Visit: Payer: PRIVATE HEALTH INSURANCE | Admitting: Internal Medicine

## 2024-06-28 ENCOUNTER — Encounter: Payer: Self-pay | Admitting: Internal Medicine

## 2024-06-28 ENCOUNTER — Other Ambulatory Visit

## 2024-06-28 VITALS — BP 122/80 | HR 58 | Ht 63.0 in | Wt 144.0 lb

## 2024-06-28 DIAGNOSIS — E890 Postprocedural hypothyroidism: Secondary | ICD-10-CM | POA: Diagnosis not present

## 2024-06-28 NOTE — Progress Notes (Signed)
 Name: [name] [name]  MRN/ DOB: [id number], April 27, 1935    Age/ Sex: 89 y.o., female    PCP: Johnny Garnette DELENA, MD   Reason for Endocrinology Evaluation: Hypothyroidism     Date of Initial Endocrinology Evaluation: 07/22/2022    HPI: [name] is a 89 y.o. female with a past medical history of Htn, Dyslipidemia and RA. The patient presented for initial endocrinology clinic visit on 07/22/2022 for consultative assistance with her Hypothyroidism.   She is S/P total thyroidectomy 08/1987 due to Follicular thyroid  carcinoma. She is S/P RAI ablation 1989    Her last thyroid  ultrasound was in 2014 with NO evidence of recurrence   TG and TG antibodies were  negative -07/2022  SUBJECTIVE:    Today (06/28/24):  [name] is here for follow-up on post operative hypothyroid    She continues to follow-up with orthopedics for knee osteoarthritis Weight has been fluctuating No local neck swelling  No dysphagia  No palpitations  No heartburn  No constipation or diarrhea - on Miralax   No tremors  Insomnia has been improving on lower dose of levothyroxine    Levothyroxine  88 mcg daily    HISTORY:  Past Medical History:  Past Medical History:  Diagnosis Date   Allergic angioedema    from shellfish and tree nuts   Anxiety    Chest pain 2000 and 2001   negative cardiac workup   Complication of anesthesia    Diverticulosis    Fibrocystic breast disease    Hyperlipidemia    Hypertension    Hypothyroidism    Irritable bowel syndrome    Perirectal cyst    ?lymphocoele   PMR (polymyalgia rheumatica) 2011   PONV (postoperative nausea and vomiting)    Seborrheic dermatitis    Squamous cell skin cancer    X1, [facility]   Thyroid  disease    post op hypothyroidism; TSH goal would be sup;pressive   Past Surgical History:  Past Surgical History:  Procedure Laterality Date   ANTERIOR AND POSTERIOR REPAIR N/A 01/22/2024   Procedure: ANTERIOR (CYSTOCELE) AND  POSTERIOR REPAIR (RECTOCELE);  Surgeon: Marilynne Rosaline SAILOR, MD;  [facility];  Service: Gynecology;  Laterality: N/A;  Total time needed is 1 hour   BILIARY DILATION  04/23/2021   Procedure: BILIARY DILATION;  Surgeon: Charlanne Groom, MD;  Location: [facility];  Service: Endoscopy;;   CHOLECYSTECTOMY N/A 04/24/2021   Procedure: LAPAROSCOPIC CHOLECYSTECTOMY;  Surgeon: Teresa Lonni HERO, MD;  Location: [facility] ORS;  Service: General;  Laterality: N/A;  90   COLONOSCOPY  2004   per Dr. Obie, clear but incomplete; Barium study added, repeat in 10 yrs    CYSTOSCOPY N/A 01/22/2024   Procedure: CYSTOSCOPY;  Surgeon: Marilynne Rosaline SAILOR, MD;  Location: [facility] OR;  Service: Gynecology;  Laterality: N/A;   DILATION AND CURETTAGE OF UTERUS     ERCP N/A 04/23/2021   Procedure: ENDOSCOPIC RETROGRADE CHOLANGIOPANCREATOGRAPHY (ERCP);  Surgeon: Charlanne Groom, MD;  Location: [facility];  Service: Endoscopy;  Laterality: N/A;   ESOPHAGOGASTRODUODENOSCOPY (EGD) WITH PROPOFOL  N/A 07/21/2018   Procedure: ESOPHAGOGASTRODUODENOSCOPY (EGD) WITH PROPOFOL ;  Surgeon: Charlanne Groom, MD;  Location: [facility];  Service: Endoscopy;  Laterality: N/A;   ESOPHAGOGASTRODUODENOSCOPY (EGD) WITH PROPOFOL  N/A 04/23/2021   Procedure: ESOPHAGOGASTRODUODENOSCOPY (EGD) WITH PROPOFOL ;  Surgeon: Charlanne Groom, MD;  Location: [facility];  Service: Endoscopy;  Laterality: N/A;   exporatory surgery for rectal mass  1986   peri rectal cyst   FOREIGN BODY REMOVAL  07/21/2018  Procedure: FOREIGN BODY REMOVAL;  Surgeon: Charlanne Groom, MD;  Location: [facility];  Service: Endoscopy;;   REMOVAL OF STONES  04/23/2021   Procedure: REMOVAL OF STONES;  Surgeon: Charlanne Groom, MD;  Location: [facility];  Service: Endoscopy;;   SPHINCTEROTOMY  04/23/2021   Procedure: ANNETT;  Surgeon: Charlanne Groom, MD;  Location: [facility];  Service: Endoscopy;;   THYROIDECTOMY  1988   S/P radiation previously seen by Deward Corn, MD @ [facility], now followed by Dr. Lenora   TONSILLECTOMY  [phone]    Social History:  reports that she has never smoked. She has never used smokeless tobacco. She reports current alcohol use. She reports that she does not use drugs. Family History: family history includes Breast cancer in her maternal grandmother; Heart attack (age of onset: 75) in her paternal uncle; Heart attack (age of onset: 62) in her paternal grandfather; Hypertension in her maternal grandmother and mother; Parkinsonism in her brother; Prostate cancer in her father.   HOME MEDICATIONS: Allergies as of 06/28/2024       Reactions   Horse-derived Products    Redness & swelling of arm   Other Anaphylaxis   Tree nuts (not peanuts)  Other reaction(s): Other (See Comments) Uncoded Allergy. Allergen: Other Reaction: red and swelling   Penicillins    Rash 04/20/2021 tolerated Zosyn    Shellfish Allergy Other (See Comments)   Other Reaction: throat closes up   Shellfish Protein-containing Drug Products    Throat swelling   Bacitracin-polymyxin B    Local swelling   Egg Protein-containing Drug Products    REACTION: Unable to get Flu Vaccine Skin Tests+ for egg products   Neomycin-bacitracin Zn-polymyx         Medication List        Accurate as of June 28, 2024  1:07 PM. If you have any questions, ask your nurse or doctor.          acetaminophen  500 MG tablet Commonly known as: TYLENOL  Take 1 tablet (500 mg total) by mouth every 6 (six) hours as needed (pain).   CALCIUM 500 PO Take 1 tablet by mouth daily at 6 (six) AM.   citalopram  10 MG tablet Commonly known as: CELEXA  TAKE 1 TABLET(10 MG) BY MOUTH DAILY   conjugated estrogens  vaginal cream Commonly known as: PREMARIN  Place 0.5g nightly in vagina for two weeks then twice a week after   EPINEPHrine  0.3 mg/0.3 mL Soaj injection Commonly known as: EPI-PEN Inject 0.3 mg into the muscle as needed for anaphylaxis.   ibuprofen  600 MG tablet Commonly  known as: ADVIL  Take 1 tablet (600 mg total) by mouth every 6 (six) hours as needed.   levothyroxine  88 MCG tablet Commonly known as: SYNTHROID  Take 1 tablet (88 mcg total) by mouth daily.   metoprolol  tartrate 100 MG tablet Commonly known as: LOPRESSOR  TAKE 1 TABLET BY MOUTH EVERY MORNING, AND 1/2 TABLET EVERY EVENING   nitrofurantoin  25 MG capsule Commonly known as: MACRODANTIN  Take 25 mg by mouth daily at 8 pm. As needed   polyethylene glycol powder 17 GM/SCOOP powder Commonly known as: GLYCOLAX /MIRALAX  Take 17 g by mouth daily. Drink 17g (1 scoop) dissolved in water per day.   pravastatin  40 MG tablet Commonly known as: PRAVACHOL  TAKE 1 TABLET(40 MG) BY MOUTH DAILY.   spironolactone  25 MG tablet Commonly known as: ALDACTONE  TAKE 1 TABLET(25 MG) BY MOUTH DAILY   VITAMIN D3 GUMMIES PO Take 1 tablet by mouth daily at 6 (six) AM.  REVIEW OF SYSTEMS: A comprehensive ROS was conducted with the patient and is negative except as per HPI   OBJECTIVE:  VS: BP 122/80 (BP Location: Left Arm, Patient Position: Sitting, Cuff Size: Normal)   Pulse (!) 58   Ht 5' 3 (1.6 m)   Wt 144 lb (65.3 kg)   SpO2 98%   BMI 25.51 kg/m    Wt Readings from Last 3 Encounters:  06/28/24 144 lb (65.3 kg)  02/28/24 140 lb (63.5 kg)  01/22/24 144 lb (65.3 kg)     EXAM: General: Pt appears well and is in NAD  Neck: General: Supple without adenopathy. Thyroid : No goiter or nodules appreciated.  Lungs: Clear with good BS bilat   Heart: Auscultation: RRR.  Extremities:  BL LE: No pretibial edema   Mental Status: Judgment, insight: Intact Orientation: Oriented to time, place, and person Mood and affect: No depression, anxiety, or agitation     DATA REVIEWED:   Latest Reference Range & Units 06/28/24 13:31  TSH 0.40 - 4.50 mIU/L 1.50  T4,Free(Direct) 0.8 - 1.8 ng/dL 1.4      Latest Reference Range & Units 07/22/22 13:50  Thyroglobulin ng/mL 0.1 (L)  Thyroglobulin Ab <  or = 1 IU/mL <1     ASSESSMENT/PLAN/RECOMMENDATIONS:   Postoperative Hypothyroidism:  - Insomnia has been improving with a lower dose of levothyroxine  - TFTs are within normal range - No change   Medications :  Continue levothyroxine  88 mcg daily    2. Hx of Follicular thyroid  carcinoma  - She is > 10 yrs  out from total thyroidectomy  - S/P RAI ablation in 1989 - Last ultrasound in 2014 showed no evidence of recurrence  - Tg , Tg Ab 's undetectable in the past, pending today     F/U in 6 months    Signed electronically by: Stefano Redgie Butts, MD  [facility] Endocrinology  [facility] [address] [place], [place] [address] Phone: [phone] FAX: [phone]   CC: Johnny Garnette LABOR, MD [address] [place] [address] Phone: [phone] Fax: [phone]   Return to Endocrinology clinic as below: No future appointments.

## 2024-07-01 ENCOUNTER — Ambulatory Visit: Payer: Self-pay | Admitting: Internal Medicine

## 2024-07-01 LAB — TSH: TSH: 1.5 m[IU]/L (ref 0.40–4.50)

## 2024-07-01 LAB — THYROGLOBULIN LEVEL: Thyroglobulin: 0.1 ng/mL — ABNORMAL LOW

## 2024-07-01 LAB — T4, FREE: Free T4: 1.4 ng/dL (ref 0.8–1.8)

## 2024-07-01 LAB — THYROGLOBULIN ANTIBODY: Thyroglobulin Ab: <1 [IU]/mL (ref <=1)

## 2024-07-01 MED ORDER — LEVOTHYROXINE SODIUM 88 MCG PO TABS: 88.0000 ug | ORAL_TABLET | Freq: Every day | ORAL | 3 refills | Status: AC

## 2024-07-03 DIAGNOSIS — M7632 Iliotibial band syndrome, left leg: Secondary | ICD-10-CM | POA: Diagnosis not present

## 2024-07-10 ENCOUNTER — Telehealth: Payer: Self-pay

## 2024-07-10 NOTE — Telephone Encounter (Signed)
 Informed patient of results. She never received letter with results.  New letter will be mailed.

## 2024-07-10 NOTE — Telephone Encounter (Signed)
 Patient called inquiring about her lab results. Lab results in final status. Sent to MD for an update.

## 2024-07-23 ENCOUNTER — Other Ambulatory Visit: Payer: Self-pay | Admitting: Family Medicine

## 2024-07-23 DIAGNOSIS — I10 Essential (primary) hypertension: Secondary | ICD-10-CM

## 2024-08-27 ENCOUNTER — Other Ambulatory Visit: Payer: Self-pay | Admitting: Family Medicine

## 2024-08-27 DIAGNOSIS — E782 Mixed hyperlipidemia: Secondary | ICD-10-CM

## 2024-09-08 ENCOUNTER — Other Ambulatory Visit: Payer: Self-pay | Admitting: Family Medicine

## 2024-09-08 DIAGNOSIS — I10 Essential (primary) hypertension: Secondary | ICD-10-CM

## 2024-09-13 ENCOUNTER — Encounter: Payer: Self-pay | Admitting: Family Medicine

## 2024-09-13 ENCOUNTER — Ambulatory Visit (INDEPENDENT_AMBULATORY_CARE_PROVIDER_SITE_OTHER): Admitting: Family Medicine

## 2024-09-13 VITALS — BP 120/62 | HR 55 | Temp 98.4°F | Ht 63.0 in | Wt 140.0 lb

## 2024-09-13 DIAGNOSIS — I10 Essential (primary) hypertension: Secondary | ICD-10-CM

## 2024-09-13 DIAGNOSIS — M2550 Pain in unspecified joint: Secondary | ICD-10-CM

## 2024-09-13 DIAGNOSIS — F418 Other specified anxiety disorders: Secondary | ICD-10-CM | POA: Diagnosis not present

## 2024-09-13 DIAGNOSIS — R339 Retention of urine, unspecified: Secondary | ICD-10-CM | POA: Diagnosis not present

## 2024-09-13 DIAGNOSIS — E782 Mixed hyperlipidemia: Secondary | ICD-10-CM

## 2024-09-13 MED ORDER — NITROFURANTOIN MACROCRYSTAL 25 MG PO CAPS: 25.0000 mg | ORAL_CAPSULE | Freq: Every day | ORAL | 3 refills | Status: AC

## 2024-09-13 MED ORDER — EPINEPHRINE 0.3 MG/0.3ML IJ SOAJ: 0.3000 mg | INTRAMUSCULAR | 11 refills | Status: AC | PRN

## 2024-09-13 MED ORDER — MELOXICAM 7.5 MG PO TABS: 7.5000 mg | ORAL_TABLET | Freq: Every day | ORAL | 3 refills | Status: AC

## 2024-09-13 NOTE — Progress Notes (Signed)
" ° °  Subjective:    Patient ID: [name] [name], female    DOB: 11/19/1934, 89 y.o.   MRN: [id number]  HPI Here to follow up on issues. She feels fine in general except for joint pains. She sees Dr. Rosaline Caper for urogynecologic care. Her BP is stable. Her moods are stable.    Review of Systems  Constitutional: Negative.   Respiratory: Negative.    Cardiovascular: Negative.   Gastrointestinal: Negative.   Genitourinary: Negative.   Musculoskeletal:  Positive for arthralgias.  Neurological: Negative.   Psychiatric/Behavioral: Negative.         Objective:   Physical Exam Constitutional:      Appearance: Normal appearance.  Cardiovascular:     Rate and Rhythm: Normal rate and regular rhythm.     Pulses: Normal pulses.     Heart sounds: Normal heart sounds.  Pulmonary:     Effort: Pulmonary effort is normal.     Breath sounds: Normal breath sounds.  Abdominal:     General: Abdomen is flat. Bowel sounds are normal. There is no distension.     Palpations: Abdomen is soft. There is no mass.     Tenderness: There is no abdominal tenderness. There is no guarding or rebound.     Hernia: No hernia is present.  Musculoskeletal:     Right lower leg: No edema.     Left lower leg: No edema.  Neurological:     Mental Status: She is alert and oriented to person, place, and time. Mental status is at baseline.  Psychiatric:        Mood and Affect: Mood normal.        Behavior: Behavior normal.        Thought Content: Thought content normal.           Assessment & Plan:  Her HTN is well controlled. Her depression and anxiety is stable. Her arthralgias are stable. Her inomplete bladder emptying is stable. She declines having any labs drawn today.  Garnette Olmsted, MD   "

## 2024-12-25 ENCOUNTER — Ambulatory Visit: Admitting: Internal Medicine
